# Patient Record
Sex: Female | Born: 1975 | Race: Black or African American | Hispanic: No | Smoking: Never smoker
Health system: Southern US, Community
[De-identification: ages and names within clinical notes are randomized; demographics above are authoritative.]

## PROBLEM LIST (undated history)

## (undated) DIAGNOSIS — R87619 Unspecified abnormal cytological findings in specimens from cervix uteri: Secondary | ICD-10-CM

## (undated) DIAGNOSIS — E669 Obesity, unspecified: Secondary | ICD-10-CM

## (undated) DIAGNOSIS — K589 Irritable bowel syndrome without diarrhea: Secondary | ICD-10-CM

## (undated) DIAGNOSIS — N809 Endometriosis, unspecified: Secondary | ICD-10-CM

## (undated) DIAGNOSIS — H6980 Other specified disorders of Eustachian tube, unspecified ear: Secondary | ICD-10-CM

## (undated) DIAGNOSIS — H6990 Unspecified Eustachian tube disorder, unspecified ear: Secondary | ICD-10-CM

## (undated) DIAGNOSIS — G629 Polyneuropathy, unspecified: Secondary | ICD-10-CM

## (undated) DIAGNOSIS — B029 Zoster without complications: Secondary | ICD-10-CM

## (undated) DIAGNOSIS — K219 Gastro-esophageal reflux disease without esophagitis: Secondary | ICD-10-CM

## (undated) DIAGNOSIS — T7840XA Allergy, unspecified, initial encounter: Secondary | ICD-10-CM

## (undated) DIAGNOSIS — Z87442 Personal history of urinary calculi: Secondary | ICD-10-CM

## (undated) DIAGNOSIS — K5900 Constipation, unspecified: Secondary | ICD-10-CM

## (undated) HISTORY — DX: Zoster without complications: B02.9

## (undated) HISTORY — PX: WISDOM TOOTH EXTRACTION: SHX21

## (undated) HISTORY — DX: Allergy, unspecified, initial encounter: T78.40XA

## (undated) HISTORY — DX: Constipation, unspecified: K59.00

## (undated) HISTORY — DX: Obesity, unspecified: E66.9

## (undated) HISTORY — DX: Other specified disorders of Eustachian tube, unspecified ear: H69.80

## (undated) HISTORY — DX: Unspecified eustachian tube disorder, unspecified ear: H69.90

## (undated) HISTORY — DX: Unspecified abnormal cytological findings in specimens from cervix uteri: R87.619

## (undated) HISTORY — DX: Polyneuropathy, unspecified: G62.9

## (undated) HISTORY — PX: BRAIN SURGERY: SHX531

## (undated) HISTORY — PX: TONSILLECTOMY: SHX5217

## (undated) HISTORY — PX: COLONOSCOPY: SHX174

## (undated) HISTORY — DX: Irritable bowel syndrome, unspecified: K58.9

---

## 1997-06-21 ENCOUNTER — Encounter: Admission: RE | Admit: 1997-06-21 | Discharge: 1997-09-19 | Payer: Self-pay | Admitting: Obstetrics & Gynecology

## 1998-02-14 ENCOUNTER — Other Ambulatory Visit: Admission: RE | Admit: 1998-02-14 | Discharge: 1998-02-14 | Payer: Self-pay | Admitting: Obstetrics and Gynecology

## 1998-04-29 ENCOUNTER — Other Ambulatory Visit: Admission: RE | Admit: 1998-04-29 | Discharge: 1998-04-29 | Payer: Self-pay | Admitting: Obstetrics and Gynecology

## 1999-09-02 ENCOUNTER — Emergency Department (HOSPITAL_COMMUNITY): Admission: EM | Admit: 1999-09-02 | Discharge: 1999-09-02 | Payer: Self-pay | Admitting: Emergency Medicine

## 1999-10-31 ENCOUNTER — Other Ambulatory Visit: Admission: RE | Admit: 1999-10-31 | Discharge: 1999-10-31 | Payer: Self-pay | Admitting: Obstetrics and Gynecology

## 2000-05-26 ENCOUNTER — Inpatient Hospital Stay (HOSPITAL_COMMUNITY): Admission: AD | Admit: 2000-05-26 | Discharge: 2000-05-26 | Payer: Self-pay | Admitting: Obstetrics & Gynecology

## 2000-10-08 ENCOUNTER — Emergency Department (HOSPITAL_COMMUNITY): Admission: EM | Admit: 2000-10-08 | Discharge: 2000-10-08 | Payer: Self-pay | Admitting: Emergency Medicine

## 2002-06-12 ENCOUNTER — Other Ambulatory Visit: Admission: RE | Admit: 2002-06-12 | Discharge: 2002-06-12 | Payer: Self-pay | Admitting: Obstetrics and Gynecology

## 2003-07-16 ENCOUNTER — Other Ambulatory Visit: Admission: RE | Admit: 2003-07-16 | Discharge: 2003-07-16 | Payer: Self-pay | Admitting: Obstetrics and Gynecology

## 2005-03-14 ENCOUNTER — Other Ambulatory Visit: Admission: RE | Admit: 2005-03-14 | Discharge: 2005-03-14 | Payer: Self-pay | Admitting: Obstetrics and Gynecology

## 2007-08-03 ENCOUNTER — Inpatient Hospital Stay (HOSPITAL_COMMUNITY): Admission: AD | Admit: 2007-08-03 | Discharge: 2007-08-03 | Payer: Self-pay | Admitting: Obstetrics and Gynecology

## 2007-11-13 ENCOUNTER — Ambulatory Visit: Payer: Self-pay | Admitting: Internal Medicine

## 2007-11-13 LAB — CONVERTED CEMR LAB
BUN: 10 mg/dL (ref 6–23)
Cholesterol: 168 mg/dL (ref 0–200)
GFR calc non Af Amer: 89 mL/min
Glucose, Bld: 95 mg/dL (ref 70–99)
Total CHOL/HDL Ratio: 3
Triglycerides: 71 mg/dL (ref 0–149)

## 2007-11-14 ENCOUNTER — Encounter: Payer: Self-pay | Admitting: Internal Medicine

## 2008-02-19 ENCOUNTER — Ambulatory Visit: Payer: Self-pay | Admitting: Internal Medicine

## 2008-04-23 ENCOUNTER — Ambulatory Visit: Payer: Self-pay | Admitting: Internal Medicine

## 2008-08-01 ENCOUNTER — Telehealth: Payer: Self-pay | Admitting: Family Medicine

## 2008-09-17 ENCOUNTER — Ambulatory Visit: Payer: Self-pay | Admitting: Internal Medicine

## 2008-09-17 DIAGNOSIS — B029 Zoster without complications: Secondary | ICD-10-CM | POA: Insufficient documentation

## 2008-09-17 DIAGNOSIS — J3089 Other allergic rhinitis: Secondary | ICD-10-CM | POA: Insufficient documentation

## 2008-09-17 LAB — CONVERTED CEMR LAB
Basophils Absolute: 0 K/uL (ref 0.0–0.1)
Basophils Relative: 0.8 % (ref 0.0–3.0)
Eosinophils Absolute: 0.1 K/uL (ref 0.0–0.7)
Eosinophils Relative: 2.5 % (ref 0.0–5.0)
HCT: 43.4 % (ref 36.0–46.0)
Hemoglobin: 15 g/dL (ref 12.0–15.0)
IgE (Immunoglobulin E), Serum: 266.1 [IU]/mL — ABNORMAL HIGH (ref 0.0–180.0)
Lymphocytes Relative: 48.9 % — ABNORMAL HIGH (ref 12.0–46.0)
Lymphs Abs: 1.8 K/uL (ref 0.7–4.0)
MCHC: 34.5 g/dL (ref 30.0–36.0)
MCV: 96.5 fL (ref 78.0–100.0)
Monocytes Absolute: 0.3 K/uL (ref 0.1–1.0)
Monocytes Relative: 8.1 % (ref 3.0–12.0)
Neutro Abs: 1.4 K/uL (ref 1.4–7.7)
Neutrophils Relative %: 39.7 % — ABNORMAL LOW (ref 43.0–77.0)
Platelets: 292 K/uL (ref 150.0–400.0)
RBC: 4.5 M/uL (ref 3.87–5.11)
RDW: 11.8 % (ref 11.5–14.6)
WBC: 3.6 10*3/microliter — ABNORMAL LOW (ref 4.5–10.5)
hCG, Beta Chain, Quant, S: 0.46 m[IU]/mL

## 2008-09-24 ENCOUNTER — Telehealth: Payer: Self-pay | Admitting: Internal Medicine

## 2008-09-24 ENCOUNTER — Encounter (INDEPENDENT_AMBULATORY_CARE_PROVIDER_SITE_OTHER): Payer: Self-pay | Admitting: *Deleted

## 2008-09-27 ENCOUNTER — Ambulatory Visit: Payer: Self-pay | Admitting: Internal Medicine

## 2008-09-27 DIAGNOSIS — L708 Other acne: Secondary | ICD-10-CM | POA: Insufficient documentation

## 2008-09-28 ENCOUNTER — Encounter (INDEPENDENT_AMBULATORY_CARE_PROVIDER_SITE_OTHER): Payer: Self-pay | Admitting: *Deleted

## 2008-10-04 ENCOUNTER — Telehealth: Payer: Self-pay | Admitting: Internal Medicine

## 2008-10-07 ENCOUNTER — Telehealth: Payer: Self-pay | Admitting: Internal Medicine

## 2008-10-12 ENCOUNTER — Ambulatory Visit: Payer: Self-pay | Admitting: Internal Medicine

## 2008-10-12 ENCOUNTER — Encounter: Payer: Self-pay | Admitting: Internal Medicine

## 2008-11-05 ENCOUNTER — Ambulatory Visit: Payer: Self-pay | Admitting: Internal Medicine

## 2008-11-29 ENCOUNTER — Telehealth: Payer: Self-pay | Admitting: Internal Medicine

## 2008-11-30 ENCOUNTER — Ambulatory Visit: Payer: Self-pay | Admitting: Internal Medicine

## 2008-12-02 ENCOUNTER — Telehealth: Payer: Self-pay | Admitting: Internal Medicine

## 2008-12-02 ENCOUNTER — Ambulatory Visit: Payer: Self-pay | Admitting: Internal Medicine

## 2008-12-02 ENCOUNTER — Encounter: Payer: Self-pay | Admitting: Internal Medicine

## 2008-12-02 ENCOUNTER — Other Ambulatory Visit: Admission: RE | Admit: 2008-12-02 | Discharge: 2008-12-02 | Payer: Self-pay | Admitting: Internal Medicine

## 2008-12-03 ENCOUNTER — Encounter (INDEPENDENT_AMBULATORY_CARE_PROVIDER_SITE_OTHER): Payer: Self-pay | Admitting: *Deleted

## 2008-12-06 ENCOUNTER — Telehealth: Payer: Self-pay | Admitting: Internal Medicine

## 2008-12-15 ENCOUNTER — Telehealth: Payer: Self-pay | Admitting: Internal Medicine

## 2009-03-08 ENCOUNTER — Ambulatory Visit: Payer: Self-pay | Admitting: Internal Medicine

## 2009-03-08 DIAGNOSIS — J039 Acute tonsillitis, unspecified: Secondary | ICD-10-CM

## 2009-03-29 ENCOUNTER — Encounter: Payer: Self-pay | Admitting: Internal Medicine

## 2009-06-07 ENCOUNTER — Ambulatory Visit: Payer: Self-pay | Admitting: Internal Medicine

## 2009-06-15 ENCOUNTER — Telehealth (INDEPENDENT_AMBULATORY_CARE_PROVIDER_SITE_OTHER): Payer: Self-pay | Admitting: *Deleted

## 2009-06-20 ENCOUNTER — Ambulatory Visit (HOSPITAL_BASED_OUTPATIENT_CLINIC_OR_DEPARTMENT_OTHER): Admission: RE | Admit: 2009-06-20 | Discharge: 2009-06-20 | Payer: Self-pay | Admitting: Otolaryngology

## 2009-12-05 ENCOUNTER — Telehealth (INDEPENDENT_AMBULATORY_CARE_PROVIDER_SITE_OTHER): Payer: Self-pay | Admitting: *Deleted

## 2009-12-05 ENCOUNTER — Ambulatory Visit (HOSPITAL_COMMUNITY): Admission: RE | Admit: 2009-12-05 | Discharge: 2009-12-05 | Payer: Self-pay | Admitting: Internal Medicine

## 2009-12-05 ENCOUNTER — Encounter: Payer: Self-pay | Admitting: Gastroenterology

## 2009-12-05 ENCOUNTER — Ambulatory Visit: Payer: Self-pay | Admitting: Internal Medicine

## 2009-12-05 DIAGNOSIS — K625 Hemorrhage of anus and rectum: Secondary | ICD-10-CM

## 2009-12-05 DIAGNOSIS — R1011 Right upper quadrant pain: Secondary | ICD-10-CM

## 2009-12-05 DIAGNOSIS — K5900 Constipation, unspecified: Secondary | ICD-10-CM | POA: Insufficient documentation

## 2009-12-05 LAB — CONVERTED CEMR LAB
ALT: 21 units/L (ref 0–35)
Albumin: 4 g/dL (ref 3.5–5.2)
Alkaline Phosphatase: 50 units/L (ref 39–117)
Basophils Relative: 1.1 % (ref 0.0–3.0)
Eosinophils Absolute: 0.1 10*3/uL (ref 0.0–0.7)
Eosinophils Relative: 1.7 % (ref 0.0–5.0)
HCT: 43 % (ref 36.0–46.0)
Hemoglobin: 14.7 g/dL (ref 12.0–15.0)
Lymphocytes Relative: 42.9 % (ref 12.0–46.0)
Lymphs Abs: 2.1 10*3/uL (ref 0.7–4.0)
MCHC: 34.1 g/dL (ref 30.0–36.0)
Monocytes Absolute: 0.5 10*3/uL (ref 0.1–1.0)
Neutro Abs: 2.2 10*3/uL (ref 1.4–7.7)
Neutrophils Relative %: 44.7 % (ref 43.0–77.0)
RBC: 4.37 M/uL (ref 3.87–5.11)
TSH: 1.54 microintl units/mL (ref 0.35–5.50)
Total Bilirubin: 0.3 mg/dL (ref 0.3–1.2)

## 2009-12-14 ENCOUNTER — Ambulatory Visit: Payer: Self-pay | Admitting: Internal Medicine

## 2009-12-14 LAB — HM COLONOSCOPY

## 2009-12-21 ENCOUNTER — Telehealth: Payer: Self-pay | Admitting: Internal Medicine

## 2010-04-11 ENCOUNTER — Ambulatory Visit: Payer: Self-pay | Admitting: Internal Medicine

## 2010-06-22 NOTE — Progress Notes (Signed)
Summary: Record request  Request for records received from [facility]. Amada Jupiter and Veterinary surgeon at [facility]. Request forwarded to [facility]. Dena Chavis  June 15, 2009 1:17 PM

## 2010-06-22 NOTE — Letter (Signed)
Summary: [facility] Instructions  [facility]  [address] [place], [place] [address]   Phone: [phone]  Fax: [phone]       ALEISHA PAONE    [birth year]    MRN: [id number]        Procedure Day /Date: 12-14-09     Arrival Time: 12:30 PM      Procedure Time: 1:30 PM     Location of Procedure:                    X      [facility] (4th Floor)  PREPARATION FOR COLONOSCOPY WITH MOVIPREP   Starting 5 days prior to your procedure 12-09-09 do not eat nuts, seeds, popcorn, corn, beans, peas,  salads, or any raw vegetables.  Do not take any fiber supplements (e.g. Metamucil, Citrucel, and Benefiber).  THE DAY BEFORE YOUR PROCEDURE         DATE:   12-13-09 KGM:WNUUVOZ  1.  Drink clear liquids the entire day-NO SOLID FOOD  2.  Do not drink anything colored red or purple.  Avoid juices with pulp.  No orange juice.  3.  Drink at least 64 oz. (8 glasses) of fluid/clear liquids during the day to prevent dehydration and help the prep work efficiently.  CLEAR LIQUIDS INCLUDE: Water Jello Ice Popsicles Tea (sugar ok, no milk/cream) Powdered fruit flavored drinks Coffee (sugar ok, no milk/cream) Gatorade Juice: apple, white grape, white cranberry  Lemonade Clear bullion, consomm, broth Carbonated beverages (any kind) Strained chicken noodle soup Hard Candy                             4.  In the morning, mix first dose of MoviPrep solution:    Empty 1 Pouch A and 1 Pouch B into the disposable container    Add lukewarm drinking water to the top line of the container. Mix to dissolve    Refrigerate (mixed solution should be used within 24 hrs)  5.  Begin drinking the prep at 5:00 p.m. The MoviPrep container is divided by 4 marks.   Every 15 minutes drink the solution down to the next mark (approximately 8 oz) until the full liter is complete.   6.  Follow completed prep with 16 oz of clear liquid of your choice (Nothing red or purple).  Continue to  drink clear liquids until bedtime.  7.  Before going to bed, mix second dose of MoviPrep solution:    Empty 1 Pouch A and 1 Pouch B into the disposable container    Add lukewarm drinking water to the top line of the container. Mix to dissolve    Refrigerate  THE DAY OF YOUR PROCEDURE      DATE: 12-14-09 DAY: Wednesday  Beginning at 8:30 AM  (5 hours before procedure):         1. Every 15 minutes, drink the solution down to the next mark (approx 8 oz) until the full liter is complete.  2. Follow completed prep with 16 oz. of clear liquid of your choice.    3. You may drink clear liquids until 11:30 PM  (2 HOURS BEFORE PROCEDURE).   MEDICATION INSTRUCTIONS  Unless otherwise instructed, you should take regular prescription medications with a small sip of water   as early as possible the morning of your procedure.        OTHER INSTRUCTIONS  You will need a responsible  adult at least 35 years of age to accompany you and drive you home.   This person must remain in the waiting room during your procedure.  Wear loose fitting clothing that is easily removed.  Leave jewelry and other valuables at home.  However, you may wish to bring a book to read or  an iPod/MP3 player to listen to music as you wait for your procedure to start.  Remove all body piercing jewelry and leave at home.  Total time from sign-in until discharge is approximately 2-3 hours.  You should go home directly after your procedure and rest.  You can resume normal activities the  day after your procedure.  The day of your procedure you should not:   Drive   Make legal decisions   Operate machinery   Drink alcohol   Return to work  You will receive specific instructions about eating, activities and medications before you leave.    The above instructions have been reviewed and explained to me by   _______________________    I fully understand and can verbalize these instructions  _____________________________ Date _________

## 2010-06-22 NOTE — Progress Notes (Signed)
Summary: No BM since 7-27  Phone Note Call from Patient Call back at Home Phone [phone]   Call For: Dr Leone Payor Reason for Call: Talk to Nurse Summary of Call: Worried she has not had a bm since colon on 7-27. Initial call taken by: Leanor Kail [facility],  December 21, 2009 9:37 AM  Follow-up for Phone Call        I reviewed with her the instuctions from Dr Leone Payor given to her at  the colonoscopy on 12/14/09, patient will try dulcolax as instructed.  I have asked her to call me back if she has any further problems or concerns. Follow-up by: Darcey Nora RN, CGRN,  December 21, 2009 9:51 AM
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
## 2010-06-22 NOTE — Assessment & Plan Note (Signed)
Summary: ABD. PAIN, CONSTIPATION, RECTAL BLEEDING   (NEW TO GI)   DEBORAH   History of Present Illness Visit Type: Initial Consult Primary GI MD: Stan Head MD [facility] Primary Provider: Etta Grandchild MD Requesting Provider: n/a Chief Complaint: abdominal pain, constipation History of Present Illness:   35 Y.O FEMALE NEW TO G.I TODAY. SHE COMES IN WITH C/O LONG TERM CONSTIPATION DATING BACK TO CHILDHOOD. STATES SHE USUALLY HAS A BM Q 2-3 DAYS. SHE HAS HAD A CHANGE IN HER BOWEL HABITS OVER THE PAST  COUPLE MONTHS WITH HARD PELLET LIKE STOOLS,INCREASED STRAINING, AND INTERMITTENT RECTAL BLEEDING .SHE NOTES BLOOD MIXED WITH STOOL AND ON THE TISSUE. SHE HAS ALSO BEEN HAVING SOME RUQ DISCOMFORT RADIATING INTO HER BACK. THIS IS MORE PROMINENT IN THE MORNINGS- NO N/V. SHE WENT TO URGENT CARE OVER THE WEEKEND,WAS TOLD TO TAKE SEVERAL DOSES OF MIRALAX WHICH SHE DID YESTERDAY-STILL HAS NOT HAD MUCH OF A BM,FEELS BLOATED.SHE DENIES ANY HX OF HEMORRHOIDS, NO RECTAL PAIN.   GI Review of Systems    Reports abdominal pain, bloating, nausea, and  vomiting.      Denies acid reflux, belching, chest pain, dysphagia with liquids, dysphagia with solids, heartburn, loss of appetite, vomiting blood, weight loss, and  weight gain.      Reports black tarry stools, change in bowel habits, constipation, and  rectal bleeding.     Denies anal fissure, diarrhea, diverticulosis, fecal incontinence, heme positive stool, hemorrhoids, irritable bowel syndrome, jaundice, light color stool, liver problems, and  rectal pain.    Current Medications (verified): 1)  Allegra 180 Mg Tabs (Fexofenadine Hcl) .... As Needed 2)  Doxycycline Hyclate 100 Mg Caps (Doxycycline Hyclate) .Marland Kitchen.. 1 By Mouth Two Times A Day  Allergies (verified): 1)  ! Pcn 2)  ! Amoxicillin  Past History:  Past Medical History: UCD- fully immunized : ALLERGIC RHINITIS DUE TO OTHER ALLERGEN (ICD-477.8) HERPES ZOSTER (ICD-053.9) URI (ICD-465.9) EUSTACHIAN  TUBE DYSFUNCTION, RIGHT (ICD-381.81) CERUMEN IMPACTION, LEFT (ICD-380.4)   Physician roster:                 Gyn - Dr. Marcelle Overlie  Past Surgical History: Reviewed history from 09/17/2008 and no changes required. wisdom teeth extracted. 1 c-section G1P1  Family History: Reviewed history from 11/05/2008 and no changes required. father- 29: good health mother - 6: lipids Neg- breast cancer, DM, CAD Pos - PAunt colon cancer   Mother-living age 64; Hypertension. Chron's Disease, Kidney stones Father-living age 69; Kidney Stones Sibling 1- living age 29; asthma, allergies Sibling 2- living age 36; allergies Sibling 3- living age 72; allergies  Clotting Disorder-Uncle Cancer-Grandmothers-cervical              Aunt-Colon Arthritis-Grandmother Diabetes-Aunt  Social History: HSG, some college married '02; 1 child. 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  Alcohol Use - no  Review of Systems       The patient complains of allergy/sinus, back pain, night sweats, shortness of breath, sore throat, and swelling of feet/legs.         ROS OTHERWISE SEE HPI  Vital Signs:  Patient profile:   35 year old female Height:      64 inches Weight:      176 pounds BMI:     30.32 BSA:     1.85 Pulse rate:   80 / minute Pulse rhythm:   regular BP sitting:   90 /  60  (left arm)  Vitals Entered By: Merri Ray CMA Duncan Dull) (December 05, 2009 10:17 AM)  Physical Exam  General:  Well developed, well nourished, no acute distress. Head:  Normocephalic and atraumatic. Eyes:  PERRLA, no icterus. Lungs:  Clear throughout to auscultation. Heart:  Regular rate and rhythm; no murmurs, rubs,  or bruits. Abdomen:  SOFT, NONTENDER, NO MASS OR HSM,BS+ Rectal:  NO EXTERNAL HEMORRHOIDS,DECREASED RECTAL TONE,SCANT STOOL TRACE POSITIVE, NO LESION FELT Neurologic:  Alert and   oriented x4;  grossly normal neurologically. Psych:  Alert and cooperative. Normal mood and affect.   Impression & Recommendations:  Problem # 1:  CONSTIPATION (ICD-564.00) Assessment Deteriorated 35 Y.O FEMALE WITH CHRONIC CONSTIPATION WITH RECENT WORSENING, ASSOCIATED INTERMITTENT RECTAL BLEEDING, AND FAMILY HX OF COLON CANCER IN A PATERNAL AUNT  DX IN HER 30'S . SUSPECT FUNCTIONALCONSTIPATION WITH LOCAL ANORECTAL IRRRITATION -HOWEVER CANNOT R/O OCCULT COLON LESION  LABS AS BELOW SCHEDULE FOR COLONOSCOPYWITH DR GESSNER. PROCEDURE/ RISKS DISCUSSED IN DETAIL WITH PT WHO IS AGREEABLE TO PROCEED HAVE ASKED HER TO TAKE AN ADDITIONAL 4-5 DOSES OF MIRALAX TODAY OR IN AM TO PURGE BOWELS,THEN START  MIRALAX 17 GM DAILY IN AM.. Orders: Colonoscopy (Colon) Ultrasound Abdomen (UAS) TLB-Hepatic/Liver Function Pnl (80076-HEPATIC) TLB-CBC Platelet - w/Differential (85025-CBCD) TLB-TSH (Thyroid Stimulating Hormone) (84443-TSH)  Problem # 2:  RUQ PAIN (ICD-789.01) Assessment: New ETIOLOGY NOT CLEAR, MAY BE RELATED TO OBSTIPATION, WILL R/O GB DISEASE.  SCHEDULE FOR UPPER ABDOMINAL US. Orders: Colonoscopy (Colon) Ultrasound Abdomen (UAS) TLB-Hepatic/Liver Function Pnl (80076-HEPATIC) TLB-CBC Platelet - w/Differential (85025-CBCD) TLB-TSH (Thyroid Stimulating Hormone) (84443-TSH)  Problem # 3:  FAMILY HX COLON CANCER (ICD-V16.0) Assessment: Comment Only PATERNAL AUNT-DX  UNDER AGE 31.  Patient Instructions: 1)  Please go to lab, basement level. 2)  We scheduled the Ultrasound for today at [facility]. Arrive at 12:45PM. 3)  DO NOT EAT OR DRINK ANYTHING UNTIL AFTER THE TEST. 4)  We scheduled the Colonoscopy with Dr. Leone Payor for 12-14-09. 5)  Directions and brochure provided. 6)  [facility] Patient Information Guide given to patient. 7)  Today or tomorrow evening take 5 doses of Miralax to purge your bowels.  Drink 1 glass of the 17 grams of Mirlax in gatorade or  water , wait 15 min between glasses.  8)  Then use Miralax 17 grams in water or clear juice daily to help with constipation. 9)  Copy sent to : Etta Grandchild, MD 10)  The medication list was reviewed and reconciled.  All changed / newly prescribed medications were explained.  A complete medication list was provided to the patient / caregiver. Prescriptions: MOVIPREP 100 GM  SOLR (PEG-KCL-NACL-NASULF-NA ASC-C) As per prep instructions.  #1 x 0   Entered by:   Lowry Ram NCMA   Authorized by:   Sammuel Cooper PA-c   Signed by:   Lowry Ram NCMA on 12/05/2009   Method used:   Electronically to        [facility]  [id number] ([facility]       [address]       [place], [place]  [address]       Ph: [phone] or [phone]       Fax: [phone]   RxID:   [id number]

## 2010-06-22 NOTE — Procedures (Signed)
Summary: Colonoscopy  Patient: Manaia Samad Note: All result statuses are Final unless otherwise noted.  Tests: (1) Colonoscopy (COL)   COL Colonoscopy           DONE (C)     [facility]     [address]     [place], [place]  [address]           COLONOSCOPY PROCEDURE REPORT           PATIENT:  Prisila, Dlouhy  MR#:  [id number]     BIRTHDATE:  November 18, 1975, 33 yrs. old  GENDER:  female     ENDOSCOPIST:  Iva Boop, MD, [facility]     REF. BY:  Etta Grandchild, M.D.     PROCEDURE DATE:  12/14/2009     PROCEDURE:  Colonoscopy 29562     ASA CLASS:  Class I     INDICATIONS:  constipation, rectal bleeding     MEDICATIONS:   Fentanyl 75 mcg IV, Versed 5 mg IV CORRECTION 7 MG     IV           DESCRIPTION OF PROCEDURE:   After the risks benefits and     alternatives of the procedure were thoroughly explained, informed     consent was obtained.  Digital rectal exam was performed and     revealed no abnormalities.   The LB CF-[id number] endoscope     was introduced through the anus and advanced to the cecum, which     was identified by both the appendix and ileocecal valve, without     limitations.  The quality of the prep was excellent, using     MoviPrep.  The instrument was then slowly withdrawn as the colon     was fully examined.     Insertion: 4:23 minutes Withdrawal: 8:00 minutes     <<PROCEDUREIMAGES>>           FINDINGS:  A normal appearing cecum, ileocecal valve, and     appendiceal orifice were identified. The ascending, hepatic     flexure, transverse, splenic flexure, descending, sigmoid colon,     and rectum appeared unremarkable.   Retroflexed views in the     rectum revealed no abnormalities.    The scope was then withdrawn     from the patient and the procedure completed.           COMPLICATIONS:  None     ENDOSCOPIC IMPRESSION:     1) Normal colonoscopy, excellent prep     RECOMMENDATIONS:     1) Take MiraLax (1 tablespoon, capful or packet) mixed in  8     ounces of liquid 2 times a day every day to help bowels move. If     no bowel movement after 2-3 days take a dulcolax tablet (or 2) to     promote a bowel movement.     If you get diarrhea take a day or two off from MiraLax and then     restart it at 1 time a day.           2) Call Dr. Marvell Fuller office and make an appointment to be seen     in early September.           Iva Boop, MD, [facility]           CC:  Etta Grandchild, MD     The Patient  n.     REVISED:  01/13/2010 11:39 AM     eSIGNED:   Iva Boop at 01/13/2010 11:39 AM           Lillia Pauls, [id number]  Note: An exclamation mark (!) indicates a result that was not dispersed into the flowsheet. Document Creation Date: 01/13/2010 11:41 AM _______________________________________________________________________  (1) Order result status: Final Collection or observation date-time: 12/14/2009 14:02 Requested date-time:  Receipt date-time:  Reported date-time:  Referring Physician:   Ordering Physician: Stan Head [phone]) Specimen Source:  Source: Launa Grill Order Number: [id number] Lab site:

## 2010-06-22 NOTE — Progress Notes (Signed)
Summary: Abd. Pain/Rectal Bleeding  Phone Note Call from Patient Call back at Home Phone [phone]   [name]: Patient Call For: DOD Reason for Call: Talk to Nurse Summary of Call: Patient was seen in Urgent Care yesterday and was told the she needed to f.u with a GI doctor this week, states that she has severe abd pain the radiates to her back, rectal bleeding and constipation x 2 weeks. Initial call taken by: Tawni Levy,  December 05, 2009 8:26 AM  Follow-up for Phone Call        Pt. will see Mike Gip [facility] today at 10:30am.    Follow-up by: Laureen Ochs LPN,  December 05, 2009 8:41 AM

## 2010-06-22 NOTE — Assessment & Plan Note (Signed)
Summary: BUMP IN THROAT/BUMPS AROUND MOUTH-LB   Vital Signs:  Patient profile:   35 year old female Height:      64 inches Weight:      179 pounds BMI:     30.84 O2 Sat:      97 % on Room air Temp:     98.5 degrees F oral Pulse rate:   77 / minute BP sitting:   108 / 72  (left arm) Cuff size:   large  Vitals Entered By: Bill Salinas CMA (April 11, 2010 4:59 PM)  O2 Flow:  Room air  Primary Care Provider:  Etta Grandchild MD   History of Present Illness: Patient presents with concern for a sore throat and a soft tissue mass she feels and sees in the right throat. She has had no fever, no rigors, no dysphagia or odynophagia, no weight loss, no night sweats.  Current Medications (verified): 1)  Allegra 180 Mg Tabs (Fexofenadine Hcl) .... As Needed 2)  Doxycycline Hyclate 100 Mg Caps (Doxycycline Hyclate) .Marland Kitchen.. 1 By Mouth Two Times A Day  Allergies (verified): 1)  ! Pcn 2)  ! 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 Marcelle Overlie  Past Surgical History: Last updated: 09/17/2008 wisdom teeth extracted. 1 c-section G1P1  Family History: Last updated: 11/05/2008 father- 1957: good health mother - 1957: lipids Neg- breast cancer, DM, CAD Pos - PAunt colon cancer   Mother-living age 64; Hypertension. Chron's Disease, Kidney stones Father-living age 43; Kidney Stones Sibling 1- living age 4; asthma, allergies Sibling 2- living age 5; allergies Sibling 3- living age 41; allergies  Clotting Disorder-Uncle Cancer-Grandmothers-cervical              Aunt-Colon Arthritis-Grandmother Diabetes-Aunt  Social History: Last updated: 12/05/2009 HSG, some college married '02; 1 child. 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  Alcohol Use - no  Review of Systems  The patient denies anorexia, fever, weight loss, weight gain, hoarseness, chest pain, dyspnea on exertion, headaches, abdominal pain, suspicious skin lesions, abnormal bleeding, and enlarged lymph nodes.    Physical Exam  General:  WNWD AA woman in no distress Head:  Normocephalic and atraumatic without obvious abnormalities. No apparent alopecia or balding. Mouth:  posterior pharynx without exudate or swelling. At the right tonsilar fossa there is mild erythema, no mass or soft tissue abnormality. Neck:  supple and no masses.   Lungs:  normal respiratory effort.   Heart:  normal rate and regular rhythm.   Skin:  turgor normal and color normal.   Cervical Nodes:  no cervical or submandibular adenoapthy   Impression & Recommendations:  Problem # 1:  SORE THROAT (ICD-462) No evidence of a bacterial infection or tonsillitis or mass effect. Probable viral pharyngitis  Plan - supportive care. She is advised that for persistent pain, fever or dysphagia she will need reevaluation.   The following medications were removed from the medication list:    Doxycycline Hyclate 100 Mg Caps (Doxycycline hyclate) .Marland Kitchen... 1 by mouth two times a day   Orders Added: 1)  Est. Patient Level II [13244]

## 2010-08-06 LAB — POCT HEMOGLOBIN-HEMACUE: Hemoglobin: 14.3 g/dL (ref 12.0–15.0)

## 2010-08-23 ENCOUNTER — Telehealth: Payer: Self-pay | Admitting: *Deleted

## 2010-08-23 ENCOUNTER — Other Ambulatory Visit (INDEPENDENT_AMBULATORY_CARE_PROVIDER_SITE_OTHER): Payer: 59

## 2010-08-23 DIAGNOSIS — Z0000 Encounter for general adult medical examination without abnormal findings: Secondary | ICD-10-CM

## 2010-08-23 LAB — BASIC METABOLIC PANEL WITH GFR
BUN: 10 mg/dL (ref 6–23)
CO2: 28 meq/L (ref 19–32)
Calcium: 8.8 mg/dL (ref 8.4–10.5)
Chloride: 103 meq/L (ref 96–112)
Creatinine, Ser: 0.8 mg/dL (ref 0.4–1.2)
GFR: 113.34 mL/min
Glucose, Bld: 73 mg/dL (ref 70–99)
Potassium: 4.1 meq/L (ref 3.5–5.1)
Sodium: 140 meq/L (ref 135–145)

## 2010-08-23 LAB — LIPID PANEL
Cholesterol: 168 mg/dL (ref 0–200)
LDL Cholesterol: 98 mg/dL (ref 0–99)
Total CHOL/HDL Ratio: 3
Triglycerides: 51 mg/dL (ref 0.0–149.0)
VLDL: 10.2 mg/dL (ref 0.0–40.0)

## 2010-08-23 LAB — URINALYSIS, ROUTINE W REFLEX MICROSCOPIC
Bilirubin Urine: NEGATIVE
Hgb urine dipstick: NEGATIVE
Ketones, ur: NEGATIVE
Nitrite: NEGATIVE
Specific Gravity, Urine: 1.02
Total Protein, Urine: NEGATIVE
Urine Glucose: NEGATIVE
Urobilinogen, UA: 0.2
pH: 6 (ref 5.0–8.0)

## 2010-08-23 LAB — CBC WITH DIFFERENTIAL/PLATELET
Basophils Relative: 0.7 % (ref 0.0–3.0)
Eosinophils Absolute: 0 10*3/uL (ref 0.0–0.7)
Monocytes Absolute: 0.3 10*3/uL (ref 0.1–1.0)
RBC: 3.98 Mil/uL (ref 3.87–5.11)
WBC: 4.3 10*3/uL — ABNORMAL LOW (ref 4.5–10.5)

## 2010-08-23 LAB — HEPATIC FUNCTION PANEL
ALT: 11 U/L (ref 0–35)
AST: 14 U/L (ref 0–37)
Albumin: 3.9 g/dL (ref 3.5–5.2)
Alkaline Phosphatase: 45 U/L (ref 39–117)
Bilirubin, Direct: 0.1 mg/dL (ref 0.0–0.3)
Total Bilirubin: 0.7 mg/dL (ref 0.3–1.2)
Total Protein: 7 g/dL (ref 6.0–8.3)

## 2010-08-23 LAB — TSH: TSH: 0.92 u[IU]/mL (ref 0.35–5.50)

## 2010-08-23 NOTE — Telephone Encounter (Signed)
Patient requesting to have HIV added to cpx labs she had today. Apt is next week. If ok, please also provide dx code.

## 2010-08-23 NOTE — Telephone Encounter (Signed)
Ok to add HIV screen v01.6

## 2010-08-24 ENCOUNTER — Other Ambulatory Visit: Payer: Self-pay | Admitting: Internal Medicine

## 2010-08-24 ENCOUNTER — Other Ambulatory Visit: Payer: 59

## 2010-08-24 DIAGNOSIS — Z202 Contact with and (suspected) exposure to infections with a predominantly sexual mode of transmission: Secondary | ICD-10-CM

## 2010-08-24 NOTE — Telephone Encounter (Signed)
Pt aware, add on sheet faxed - lab aware

## 2010-08-25 LAB — HIV ANTIBODY (ROUTINE TESTING W REFLEX): HIV: NONREACTIVE

## 2010-08-28 ENCOUNTER — Encounter: Payer: Self-pay | Admitting: Internal Medicine

## 2010-08-29 ENCOUNTER — Encounter: Payer: Self-pay | Admitting: Internal Medicine

## 2010-08-29 ENCOUNTER — Ambulatory Visit (INDEPENDENT_AMBULATORY_CARE_PROVIDER_SITE_OTHER): Payer: 59 | Admitting: Internal Medicine

## 2010-08-29 DIAGNOSIS — L708 Other acne: Secondary | ICD-10-CM

## 2010-08-29 DIAGNOSIS — L700 Acne vulgaris: Secondary | ICD-10-CM

## 2010-08-29 DIAGNOSIS — F341 Dysthymic disorder: Secondary | ICD-10-CM

## 2010-08-29 DIAGNOSIS — Z0000 Encounter for general adult medical examination without abnormal findings: Secondary | ICD-10-CM

## 2010-08-29 DIAGNOSIS — F418 Other specified anxiety disorders: Secondary | ICD-10-CM

## 2010-08-29 MED ORDER — DROSPIRENONE-ETHINYL ESTRADIOL 3-0.02 MG PO TABS: 1.0000 | ORAL_TABLET | Freq: Every day | ORAL | Status: DC

## 2010-08-29 MED ORDER — BUPROPION HCL ER (XL) 150 MG PO TB24: 150.0000 mg | ORAL_TABLET | Freq: Every day | ORAL | Status: DC
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
## 2010-08-30 ENCOUNTER — Encounter: Payer: Self-pay | Admitting: Internal Medicine

## 2010-09-08 ENCOUNTER — Telehealth: Payer: Self-pay | Admitting: Internal Medicine
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
## 2010-09-08 NOTE — Telephone Encounter (Signed)
Usually a broken rib will heal. There is no intevention. Would like a copy of x-ray report from urgent care. She will need f/u OV if her symptms don't improve.

## 2010-09-11 NOTE — Telephone Encounter (Signed)
LMOM to inform Pt. 

## 2010-09-18 ENCOUNTER — Telehealth: Payer: Self-pay | Admitting: *Deleted

## 2010-09-18 NOTE — Telephone Encounter (Signed)
Pt feels that Wellbutrin has been giving her hives. She want alt from MD. Please advise.

## 2010-09-19 NOTE — Telephone Encounter (Signed)
Patient notified and will update [place] on status

## 2010-09-19 NOTE — Telephone Encounter (Signed)
Don't usually see hives with wellbutrin. OK to stop medication. See if hives clear. A cahnge in meds after hives clear

## 2010-09-20 ENCOUNTER — Other Ambulatory Visit: Payer: Self-pay | Admitting: Obstetrics and Gynecology

## 2010-09-25 ENCOUNTER — Encounter: Payer: Self-pay | Admitting: Internal Medicine

## 2010-09-26 ENCOUNTER — Ambulatory Visit (INDEPENDENT_AMBULATORY_CARE_PROVIDER_SITE_OTHER): Payer: 59 | Admitting: Internal Medicine

## 2010-09-26 DIAGNOSIS — T50905A Adverse effect of unspecified drugs, medicaments and biological substances, initial encounter: Secondary | ICD-10-CM

## 2010-09-26 DIAGNOSIS — L500 Allergic urticaria: Secondary | ICD-10-CM

## 2010-09-26 DIAGNOSIS — F329 Major depressive disorder, single episode, unspecified: Secondary | ICD-10-CM

## 2010-09-26 DIAGNOSIS — F419 Anxiety disorder, unspecified: Secondary | ICD-10-CM | POA: Insufficient documentation

## 2010-09-26 DIAGNOSIS — F341 Dysthymic disorder: Secondary | ICD-10-CM

## 2010-09-26 MED ORDER — PREDNISONE 10 MG PO TABS: 10.0000 mg | ORAL_TABLET | Freq: Every day | ORAL | Status: AC

## 2010-09-26 MED ORDER — DESVENLAFAXINE SUCCINATE ER 50 MG PO TB24: 50.0000 mg | ORAL_TABLET | Freq: Every day | ORAL | Status: DC

## 2010-09-26 NOTE — Progress Notes (Signed)
  Subjective:    Patient ID: Susan Roth, female    DOB: June 01, 1975, 35 y.o.   MRN: [id number]  HPI Susan Roth returns for follow-up. At her last visit she was started on wellbutrin for anxiety and depression. She was also able to see a counselor, covered by her work related insurance. She reports that she did well: she was more energetic and felt better. After 3 weeks of taking wellbutrin she developed diffuse urticaria. Belatedly she remembers being on wellbutrin several years ago and had this same reaction. She has had no respiratory problems. She is taking claratin in the AM and benadryl at night for relief of itching.   PMH, FamHx and SocHx reviewed for any changes and relevance.    Review of Systems  Constitutional: Negative.   HENT: Negative.   Eyes: Negative.   Respiratory: Negative.   Cardiovascular: Negative.   Gastrointestinal: Negative.   Musculoskeletal: Negative.   Skin: Positive for color change.       [Diffuse hives. Neurological: Negative.   Psychiatric/Behavioral: Negative for hallucinations and confusion. The patient is nervous/anxious. The patient is not hyperactive.        Objective:   Physical Exam WNWD, overweight AA woman in no distress Lungs - good breath sounds, no wheezing Derm - erythematous macular-papular rash       Assessment & Plan:  1. Urticaria - most likely a drug reaction to wellbutrin.  Plan - continue claretin and benadryl           Prednisone 10mg  qd x 10  2. Depression/anxiety - did well with wellbutrin but was intolerant    Plan - trial of pristiq 50mg  daily - sample provided and discount card.

## 2010-10-18 ENCOUNTER — Encounter: Payer: Self-pay | Admitting: Internal Medicine

## 2010-10-19 ENCOUNTER — Ambulatory Visit: Payer: 59 | Admitting: Internal Medicine

## 2010-10-26 ENCOUNTER — Encounter: Payer: Self-pay | Admitting: Internal Medicine

## 2010-10-27 ENCOUNTER — Ambulatory Visit (INDEPENDENT_AMBULATORY_CARE_PROVIDER_SITE_OTHER): Payer: 59 | Admitting: Internal Medicine

## 2010-10-27 DIAGNOSIS — F341 Dysthymic disorder: Secondary | ICD-10-CM

## 2010-10-27 DIAGNOSIS — Z0289 Encounter for other administrative examinations: Secondary | ICD-10-CM

## 2010-10-29 NOTE — Progress Notes (Signed)
  Subjective:    Patient ID: Susan Roth, female    DOB: 1975-09-29, 35 y.o.   MRN: [id number]  HPI  No show - no charge   Review of Systems     Objective:   Physical Exam        Assessment & Plan:

## 2010-12-15 ENCOUNTER — Other Ambulatory Visit: Payer: 59

## 2010-12-15 ENCOUNTER — Ambulatory Visit (INDEPENDENT_AMBULATORY_CARE_PROVIDER_SITE_OTHER): Payer: 59 | Admitting: Internal Medicine

## 2010-12-15 ENCOUNTER — Encounter: Payer: Self-pay | Admitting: *Deleted

## 2010-12-15 DIAGNOSIS — L509 Urticaria, unspecified: Secondary | ICD-10-CM

## 2010-12-15 DIAGNOSIS — L501 Idiopathic urticaria: Secondary | ICD-10-CM | POA: Insufficient documentation

## 2010-12-15 DIAGNOSIS — J3089 Other allergic rhinitis: Secondary | ICD-10-CM

## 2010-12-15 NOTE — Patient Instructions (Addendum)
Try combination antihistamine strategy for hives: zyrtec or claritin every day, PLUS  Tagamet or pepcid every day.  These are otc meds.  Consider trying Sudafed from pharmacy counter as a decongestant for stuffy head fullness.   Consider being off Yaz for a couple of months if possible, to see if it is contributing to your hives.   Order- lab- Allergy profile- dx allergies  Schedule return off all antihistamines- this includes the ones we mentioned above, and also otc cold/ allergy meds, cough syrups, otc sleep meds. If you aren't sure,  Please call us.

## 2010-12-15 NOTE — Progress Notes (Signed)
Subjective:    Patient ID: Susan Roth, female    DOB: 1975-12-07, 35 y.o.   MRN: [id number]  HPI 12/15/10- 30 yoF never smoker referred by Dr Debby Bud for allergy evaluation. Describes onset urticaria 3 months ago, shortly after starting Wellbutrin. Similar hives when she took it before, several years ago. Now off 3 months, but still experiencing at least one lesion daily. She also started Yaz hormonal med for acne control about the same time.  Also c/o ears pop, glands/ throat feel full. Motrin helps this feeling. Morning sinus pressure, teeth hurt, throat scratchy, - worst in mid-summer. Zyrtec some help. Had tonsils out Feb, 2011 trying to address this feeling. Denies chest discomfort, cough or wheeze. Eczema as a child. Family hx allergies- father hayfever, daughter asthma. Bananas make throat itch. No other food and no insect sting reaction.  CT sinuses 10/12/08- clear House on slab, no mold, central AC, 2 dogs in, no smokers.   Review of Systems Constitutional:   No-   weight loss, night sweats, fevers, chills, fatigue, lassitude. HEENT:   No-   headaches, difficulty swallowing, tooth/dental problems,                  CV:  No-   chest pain, orthopnea, PND, swelling in lower extremities, anasarca, dizziness, palpitations  GI:  No-   heartburn, indigestion, abdominal pain, nausea, vomiting, diarrhea,                 change in bowel habits, loss of appetite  Resp: No-   shortness of breath with exertion or at rest.  No-  excess mucus,             No-   productive cough,  No non-productive cough,  No-  coughing up of blood.              No-   change in color of mucus.  No- wheezing.    Skin  GU: No-   dysuria, change in color of urine, no urgency or frequency.  No- flank pain.  MS:  No-   joint pain or swelling.  No- decreased range of motion.  No- back pain.  Psych:  No- change in mood or affect. No depression or anxiety.  No memory loss.      Objective:   Physical  Exam General- Alert, Oriented, Affect-appropriate, Distress- none acute Skin- rash-none, cystic acne face, excoriation- none Lymphadenopathy- none Head- atraumatic            Eyes- Gross vision intact, PERRLA, conjunctivae clear secretions            Ears- Hearing, canals normal            Nose- Mucus bridging,No- Septal dev,, polyps, erosion, perforation             Throat- Mallampati II , mucosa clear , drainage- none, tonsils- atrophic Neck- flexible , trachea midline, no stridor , thyroid nl, carotid no bruit Chest - symmetrical excursion , unlabored           Heart/CV- RRR , no murmur , no gallop  , no rub, nl s1 s2                           - JVD- none , edema- none, stasis changes- none, varices- none           Lung- clear to P&A, wheeze- none, cough- none , dullness-none, rub- none  Chest wall-  Abd- tender-no, distended-no, bowel sounds-present, HSM- no Br/ Gen/ Rectal- Not done, not indicated Extrem- cyanosis- none, clubbing, none, atrophy- none, strength- nl Neuro- grossly intact to observation         Assessment & Plan:   No problem-specific assessment & plan notes found for this encounter.

## 2010-12-15 NOTE — Assessment & Plan Note (Addendum)
Onset began around time she was taking wellbutin, so that got blamed. However, she also began YAZ around that time, and it may be a hormonal problem. She takes Yaz for acne control and is not eager to be off it. She may need to come back to this issue if she can't control well enough with antihistamines. We can try dual therapy antihistamine strategy.

## 2010-12-15 NOTE — Assessment & Plan Note (Signed)
Complaint of nasal/ maxillary pressure, associated with annual summer into fall time of year. There may be an atopic component, or it may be non-allergic rhinitis, as discussed. What bothers her most is the sense of her ears popping and that her cervical glands/ throat feel full. We will try decongestant, nasal steroid, send allergy profile and return off antihistamines for allergy skin testing.

## 2010-12-17 ENCOUNTER — Encounter: Payer: Self-pay | Admitting: Internal Medicine

## 2010-12-19 LAB — ALLERGY FULL PROFILE
Allergen,Goose feathers, e70: <0.1 kU/L (ref <0.35)
Bahia Grass: 1.17 kU/L — ABNORMAL HIGH (ref <0.35)
Bermuda Grass: 0.94 kU/L — ABNORMAL HIGH (ref <0.35)
Candida Albicans: 1.41 kU/L — ABNORMAL HIGH (ref <0.35)
Curvularia lunata: 0.63 kU/L — ABNORMAL HIGH (ref <0.35)
D. farinae: 0.24 kU/L (ref <0.35)
Elm IgE: 0.48 kU/L — ABNORMAL HIGH (ref <0.35)
Fescue: 1.39 kU/L — ABNORMAL HIGH (ref <0.35)
G005 Rye, Perennial: 1.02 kU/L — ABNORMAL HIGH (ref <0.35)
G009 Red Top: 1.9 kU/L — ABNORMAL HIGH (ref <0.35)
Goldenrod: <0.1 kU/L (ref <0.35)
Helminthosporium halodes: 1.27 kU/L — ABNORMAL HIGH (ref <0.35)
House Dust Hollister: 0.23 kU/L (ref <0.35)
Plantain: 0.34 kU/L (ref <0.35)

## 2010-12-25 NOTE — Progress Notes (Signed)
Quick Note:  PT aware of results. ______ 

## 2011-02-07 ENCOUNTER — Ambulatory Visit (INDEPENDENT_AMBULATORY_CARE_PROVIDER_SITE_OTHER): Payer: 59 | Admitting: Internal Medicine

## 2011-02-07 ENCOUNTER — Encounter: Payer: Self-pay | Admitting: Internal Medicine

## 2011-02-07 VITALS — BP 106/78 | HR 79 | Ht 64.0 in | Wt 183.0 lb

## 2011-02-07 DIAGNOSIS — J3089 Other allergic rhinitis: Secondary | ICD-10-CM

## 2011-02-07 DIAGNOSIS — L501 Idiopathic urticaria: Secondary | ICD-10-CM

## 2011-02-07 NOTE — Progress Notes (Signed)
Subjective:    Patient ID: Susan Roth, female    DOB: [birth year], 35 y.o.   MRN: [id number]  HPI  12/15/10- 35 yoF never smoker referred by Dr Debby Bud for allergy evaluation. Describes onset urticaria 3 months ago, shortly after starting Wellbutrin. Similar hives when she took it before, several years ago. Now off 3 months, but still experiencing at least one lesion daily. She also started Yaz hormonal med for acne control about the same time.  Also c/o ears pop, glands/ throat feel full. Motrin helps this feeling. Morning sinus pressure, teeth hurt, throat scratchy, - worst in mid-summer. Zyrtec some help. Had tonsils out Feb, 2011 trying to address this feeling. Denies chest discomfort, cough or wheeze. Eczema as a child. Family hx allergies- father hayfever, daughter asthma. Bananas make throat itch. No other food and no insect sting reaction.  CT sinuses 10/12/08- clear House on slab, no mold, central AC, 2 dogs in, no smokers.  02/07/11-35 year old female never smoker followed for Urticaria, Rhinitis She had stopped Wellbutrin in March and was having less urticaria when I first saw her in July. She has not stopped Yaz. She still gets an occasional hive,  but much less. She is more aware of rhinitis now with nasal congestion and drainage. She had been using nasal spray plus Benadryl plus Zyrtec but did not try adding an acid blocker antihistamine as suggested last visit. We discussed environmental exposures. She tells me ceiling fell at work after rain, suggesting possibility that there had been water leaks and significant mold exposure, but we don't know for sure. Allergy profile 12/15/2010 total IgE 213.5 with many specific elevations. For skin test today- strongly positive for many common inhalant allergens.   Review of Systems  Constitutional:   No-   weight loss, night sweats, fevers, chills, fatigue, lassitude. HEENT:   No-   headaches, difficulty swallowing, tooth/dental problems,               CV:  No- chest pain, orthopnea, PND, swelling in lower extremities, anasarca, dizziness, palpitations GI:  No-   heartburn, indigestion, abdominal pain, nausea, vomiting, diarrhea,                 change in bowel habits, loss of appetite Resp: No-   shortness of breath with exertion or at rest.  No-  excess mucus,             No-   productive cough,  No non-productive cough,  No-  coughing up of blood.              No-   change in color of mucus.  No- wheezing.   Skin- per HPI GU: No-   dysuria, change in color of urine, no urgency or frequency.  No- flank pain. MS:  No-   joint pain or swelling.  No- decreased range of motion.  No- back pain. Psych:  No- change in mood or affect. No depression or anxiety.  No memory loss.     Objective:   Physical Exam  General- Alert, Oriented, Affect-appropriate, Distress- none acute Skin- rash-none, cystic acne face, excoriation- none.   No visible hives today. Lymphadenopathy- none Head- atraumatic            Eyes- Gross vision intact, PERRLA, conjunctivae clear secretions            Ears- Hearing, canals normal            Nose- Mucus bridging,No- Septal dev,, polyps, erosion,  perforation             Throat- Mallampati II , mucosa clear , drainage- none, tonsils- atrophic Neck- flexible , trachea midline, no stridor , thyroid nl, carotid no bruit Chest - symmetrical excursion , unlabored           Heart/CV- RRR , no murmur , no gallop  , no rub, nl s1 s2                           - JVD- none , edema- none, stasis changes- none, varices- none           Lung- clear to P&A, wheeze- none, cough- none , dullness-none, rub- none           Chest wall-  Abd- tender-no, distended-no, bowel sounds-present, HSM- no Br/ Gen/ Rectal- Not done, not indicated Extrem- cyanosis- none, clubbing, none, atrophy- none, strength- nl Neuro- grossly intact to observation         Assessment & Plan:   No problem-specific assessment & plan notes found  for this encounter.

## 2011-02-07 NOTE — Patient Instructions (Signed)
Ok to continue antihistamines and nasal sprays as long as they are good enough. If hives get worse, remember you can try adding a different class of antihistamine- otc zantac or pepcid.  If you don't do well enough, then we can consider trying allergy shots.

## 2011-02-11 NOTE — Assessment & Plan Note (Signed)
Urticaria seems to be fading away very slowly. She is an atopic person as noted. This problem more likely relates used to use of the Wellbutrin which has caused hives before, or to her hormone medication he acts. I have encouraged her to stop he asked for a month or 2 if possible. Meanwhile she can continue antihistamines which I reviewed again. She is not nearly concerned now as she was earlier.

## 2011-02-11 NOTE — Assessment & Plan Note (Signed)
She is strongly atopic. This relates most correctly to her rhinitis symptoms. She feels she can control those adequately for now with antihistamines and a nasal steroid spray. We have discussed environmental controls. Allergy vaccine is a definite option if needed later.

## 2011-02-12 LAB — WET PREP, GENITAL
Clue Cells Wet Prep HPF POC: NONE SEEN
Yeast Wet Prep HPF POC: NONE SEEN

## 2011-02-15 ENCOUNTER — Encounter: Payer: Self-pay | Admitting: Internal Medicine

## 2011-04-13 ENCOUNTER — Encounter (HOSPITAL_COMMUNITY): Payer: Self-pay

## 2011-04-13 ENCOUNTER — Inpatient Hospital Stay (HOSPITAL_COMMUNITY)
Admission: AD | Admit: 2011-04-13 | Discharge: 2011-04-13 | Disposition: A | Discharge status: Home / Self Care | Payer: 59 | Source: Ambulatory Visit | Attending: Obstetrics & Gynecology | Admitting: Obstetrics & Gynecology

## 2011-04-13 DIAGNOSIS — R1013 Epigastric pain: Secondary | ICD-10-CM | POA: Insufficient documentation

## 2011-04-13 DIAGNOSIS — K648 Other hemorrhoids: Secondary | ICD-10-CM

## 2011-04-13 DIAGNOSIS — N923 Ovulation bleeding: Secondary | ICD-10-CM

## 2011-04-13 DIAGNOSIS — R1011 Right upper quadrant pain: Secondary | ICD-10-CM

## 2011-04-13 DIAGNOSIS — K625 Hemorrhage of anus and rectum: Secondary | ICD-10-CM | POA: Insufficient documentation

## 2011-04-13 LAB — WET PREP, GENITAL
Trich, Wet Prep: NONE SEEN
Yeast Wet Prep HPF POC: NONE SEEN

## 2011-04-13 LAB — URINALYSIS, ROUTINE W REFLEX MICROSCOPIC
Bilirubin Urine: NEGATIVE
Glucose, UA: NEGATIVE mg/dL
Hgb urine dipstick: NEGATIVE
Ketones, ur: NEGATIVE mg/dL
Leukocytes, UA: NEGATIVE
Specific Gravity, Urine: 1.02 (ref 1.005–1.030)
Urobilinogen, UA: 0.2 mg/dL (ref 0.0–1.0)

## 2011-04-13 LAB — COMPREHENSIVE METABOLIC PANEL
AST: 12 U/L (ref 0–37)
BUN: 10 mg/dL (ref 6–23)
CO2: 28 mEq/L (ref 19–32)
Calcium: 9.4 mg/dL (ref 8.4–10.5)
Chloride: 99 mEq/L (ref 96–112)
Creatinine, Ser: 0.79 mg/dL (ref 0.50–1.10)
GFR calc Af Amer: >90 mL/min (ref >90)
GFR calc non Af Amer: >90 mL/min (ref >90)
Glucose, Bld: 93 mg/dL (ref 70–99)
Total Bilirubin: 0.2 mg/dL — ABNORMAL LOW (ref 0.3–1.2)

## 2011-04-13 LAB — CBC
HCT: 39.2 % (ref 36.0–46.0)
Hemoglobin: 13.6 g/dL (ref 12.0–15.0)
MCH: 32.3 pg (ref 26.0–34.0)
MCV: 93.1 fL (ref 78.0–100.0)
RBC: 4.21 MIL/uL (ref 3.87–5.11)
WBC: 4.4 10*3/uL (ref 4.0–10.5)

## 2011-04-13 NOTE — ED Provider Notes (Signed)
History   Susan Roth is a 35 y.o. year old [id number] female who presents to [facility] reporting: 1. RUQ pain that wraps around to her back x 1 week, worse after eating (4-6/10 pain) 2. Nausea after eating x 1 week 3. Malaise x 2 weeks 4. Two episodes of bleeding after BM--not sure if is was rectal or vaginal bleeding.  She was seen at an urgent care 1 weeks ago for dysuria and right flank pain and was Tx w/ Cipro. Dysuria resolved.  CSN: [id number] Arrival date & time: 04/13/2011  2:32 PM   None     Chief Complaint  Patient presents with  . Abdominal Pain    (Consider location/radiation/quality/duration/timing/severity/associated sxs/prior treatment) HPI  Past Medical History  Diagnosis Date  . Allergic rhinitis   . Herpes zoster without mention of complication   . URI (upper respiratory infection)   . Dysfunction of eustachian tube   . Impacted cerumen   Chronic constipation Kidney stones  Past Surgical History  Procedure Date  . Wisdom tooth extraction   . Cesarean section     x1  . Tonsillectomy     as an adult - 2011    Family History  Problem Relation Age of Onset  . Hyperlipidemia Mother   . Hypertension Mother   . Nephrolithiasis Mother   . Crohn's disease Mother   . Nephrolithiasis Father   . Depression Father     bipolar  . Asthma Other   . Allergies Other   . Allergies Other   . Clotting disorder Other   . Cancer Other     Cervical  . Arthritis Other   . Cancer Other     Colon  . Diabetes Other     History  Substance Use Topics  . Smoking status: Never Smoker   . Smokeless tobacco: Not on file   Comment: positive hx of passive tobacco smoke exposure  . Alcohol Use: 0.6 oz/week    1 Glasses of wine per week    OB History    Grav Para Term Preterm Abortions TAB SAB Ect Mult Living   1 1 0 1 0 0 0 0 0 1       Review of Systems  Constitutional: Positive for chills and fatigue. Negative for fever, appetite change and unexpected weight  change.  Cardiovascular: Negative for chest pain.  Gastrointestinal: Positive for nausea, abdominal pain (RUQ), constipation and blood in stool (possible). Negative for vomiting, diarrhea, abdominal distention and rectal pain.  Genitourinary: Positive for flank pain and vaginal bleeding (possible). Negative for dysuria, urgency, frequency, hematuria, vaginal discharge, difficulty urinating and menstrual problem.    Allergies  Amoxicillin; Penicillins; and Wellbutrin  Home Medications  No current outpatient prescriptions on file.  BP 123/95  Pulse 71  Temp(Src) 98.3 F (36.8 C) (Oral)  Resp 20  Ht 5\' 4"  (1.626 m)  Wt 80.196 kg (176 lb 12.8 oz)  BMI 30.35 kg/m2  SpO2 99%  LMP 03/18/2011  Physical Exam  Nursing note and vitals reviewed. Constitutional: She is oriented to person, place, and time. She appears well-developed and well-nourished. No distress.  Cardiovascular: Normal rate.   Pulmonary/Chest: Effort normal.  Abdominal: Soft. Bowel sounds are normal. She exhibits no mass. There is tenderness. There is no rebound and no guarding (mild suprapubic).  Genitourinary: Rectal exam shows internal hemorrhoid (x1 small). Rectal exam shows no external hemorrhoid. There is no lesion on the right labia. There is no lesion on the left labia. Uterus is  not tender. Cervix exhibits friability. Cervix exhibits no motion tenderness and no discharge. Right adnexum displays no tenderness. Left adnexum displays no tenderness. No bleeding around the vagina. No vaginal discharge found.       Pelvic exam performed by Henrietta Hoover, PA  Neurological: She is alert and oriented to person, place, and time.  Skin: Skin is warm and dry.  Psychiatric: She has a normal mood and affect.    ED Course  Procedures (including critical care time)  Results for orders placed during the hospital encounter of 04/13/11 (from the past 24 hour(s))  URINALYSIS, ROUTINE W REFLEX MICROSCOPIC     Status: Normal    Collection Time   04/13/11  3:30 PM      Component Value Range   Color, Urine YELLOW  YELLOW    Appearance CLEAR  CLEAR    Specific Gravity, Urine 1.020  1.005 - 1.030    pH 7.5  5.0 - 8.0    Glucose, UA NEGATIVE  NEGATIVE (mg/dL)   Hgb urine dipstick NEGATIVE  NEGATIVE    Bilirubin Urine NEGATIVE  NEGATIVE    Ketones, ur NEGATIVE  NEGATIVE (mg/dL)   Protein, ur NEGATIVE  NEGATIVE (mg/dL)   Urobilinogen, UA 0.2  0.0 - 1.0 (mg/dL)   Nitrite NEGATIVE  NEGATIVE    Leukocytes, UA NEGATIVE  NEGATIVE   POCT PREGNANCY, URINE     Status: Normal   Collection Time   04/13/11  3:56 PM      Component Value Range   Preg Test, Ur NEGATIVE    CBC     Status: Normal   Collection Time   04/13/11  4:40 PM      Component Value Range   WBC 4.4  4.0 - 10.5 (K/uL)   RBC 4.21  3.87 - 5.11 (MIL/uL)   Hemoglobin 13.6  12.0 - 15.0 (g/dL)   HCT 21.3  08.6 - 57.8 (%)   MCV 93.1  78.0 - 100.0 (fL)   MCH 32.3  26.0 - 34.0 (pg)   MCHC 34.7  30.0 - 36.0 (g/dL)   RDW 46.9  62.9 - 52.8 (%)   Platelets 328  150 - 400 (K/uL)  COMPREHENSIVE METABOLIC PANEL     Status: Abnormal   Collection Time   04/13/11  4:40 PM      Component Value Range   Sodium 133 (*) 135 - 145 (mEq/L)   Potassium 3.6  3.5 - 5.1 (mEq/L)   Chloride 99  96 - 112 (mEq/L)   CO2 28  19 - 32 (mEq/L)   Glucose, Bld 93  70 - 99 (mg/dL)   BUN 10  6 - 23 (mg/dL)   Creatinine, Ser 4.13  0.50 - 1.10 (mg/dL)   Calcium 9.4  8.4 - 24.4 (mg/dL)   Total Protein 7.0  6.0 - 8.3 (g/dL)   Albumin 3.5  3.5 - 5.2 (g/dL)   AST 12  0 - 37 (U/L)   ALT 9  0 - 35 (U/L)   Alkaline Phosphatase 50  39 - 117 (U/L)   Total Bilirubin 0.2 (*) 0.3 - 1.2 (mg/dL)   GFR calc non Af Amer >90  >90 (mL/min)   GFR calc Af Amer >90  >90 (mL/min)    MDM  Assessment: 1. Epigastric pain w/ no evidence of Gal Bladder Dz. 2. Rectal bleeding vs intermenstrual spotting.  Plan: 1. F/U w/ PCP or ED for worsening Sx. 2. F/U w/ Dr. Aldona Bar if intermenstrual spotting is  verified 3.  Increase fluid and fiber and void straining w/ BMs  Robynne Roat 04/13/2011 5:26 PM

## 2011-04-13 NOTE — Progress Notes (Signed)
Patient states she was treated for a UTI one week ago at an Urgent Care on [address]. Has taken medication as instructed. Has been having upper abdominal pain that radiated into her back that is not going away. Has had bleeding but not sure if in urine or stool after a bowel movement. Has been having this pain for a couple of months. Has a history of a kidney stone.

## 2011-04-14 LAB — GC/CHLAMYDIA PROBE AMP, GENITAL: GC Probe Amp, Genital: NEGATIVE

## 2011-04-28 ENCOUNTER — Emergency Department (HOSPITAL_COMMUNITY): Payer: 59

## 2011-04-28 ENCOUNTER — Encounter (HOSPITAL_COMMUNITY): Payer: Self-pay | Admitting: Emergency Medicine

## 2011-04-28 ENCOUNTER — Encounter (HOSPITAL_COMMUNITY): Payer: Self-pay | Admitting: *Deleted

## 2011-04-28 ENCOUNTER — Emergency Department (INDEPENDENT_AMBULATORY_CARE_PROVIDER_SITE_OTHER)
Admission: EM | Admit: 2011-04-28 | Discharge: 2011-04-28 | Disposition: A | Discharge status: Nursing Home (Includes ICF) | Payer: 59 | Source: Home / Self Care | Attending: Emergency Medicine | Admitting: Emergency Medicine

## 2011-04-28 ENCOUNTER — Emergency Department (HOSPITAL_COMMUNITY)
Admission: EM | Admit: 2011-04-28 | Discharge: 2011-04-29 | Disposition: A | Discharge status: Home / Self Care | Payer: 59 | Attending: Emergency Medicine | Admitting: Emergency Medicine

## 2011-04-28 DIAGNOSIS — Z87442 Personal history of urinary calculi: Secondary | ICD-10-CM | POA: Insufficient documentation

## 2011-04-28 DIAGNOSIS — M25519 Pain in unspecified shoulder: Secondary | ICD-10-CM | POA: Insufficient documentation

## 2011-04-28 DIAGNOSIS — R1011 Right upper quadrant pain: Secondary | ICD-10-CM

## 2011-04-28 DIAGNOSIS — R142 Eructation: Secondary | ICD-10-CM | POA: Insufficient documentation

## 2011-04-28 DIAGNOSIS — R141 Gas pain: Secondary | ICD-10-CM | POA: Insufficient documentation

## 2011-04-28 DIAGNOSIS — R10811 Right upper quadrant abdominal tenderness: Secondary | ICD-10-CM | POA: Insufficient documentation

## 2011-04-28 DIAGNOSIS — R110 Nausea: Secondary | ICD-10-CM | POA: Insufficient documentation

## 2011-04-28 LAB — POCT PREGNANCY, URINE: Preg Test, Ur: NEGATIVE

## 2011-04-28 LAB — COMPREHENSIVE METABOLIC PANEL
Alkaline Phosphatase: 46 U/L (ref 39–117)
BUN: 9 mg/dL (ref 6–23)
Chloride: 104 mEq/L (ref 96–112)
GFR calc Af Amer: >90 mL/min (ref >90)
GFR calc non Af Amer: >90 mL/min (ref >90)
Glucose, Bld: 90 mg/dL (ref 70–99)
Potassium: 3.9 mEq/L (ref 3.5–5.1)
Total Bilirubin: 0.3 mg/dL (ref 0.3–1.2)

## 2011-04-28 LAB — POCT URINALYSIS DIP (DEVICE)
Bilirubin Urine: NEGATIVE
Leukocytes, UA: NEGATIVE
Specific Gravity, Urine: 1.02 (ref 1.005–1.030)
pH: 5.5 (ref 5.0–8.0)

## 2011-04-28 LAB — DIFFERENTIAL
Lymphs Abs: 2.8 10*3/uL (ref 0.7–4.0)
Monocytes Absolute: 0.4 10*3/uL (ref 0.1–1.0)
Monocytes Relative: 7 % (ref 3–12)
Neutro Abs: 2.6 10*3/uL (ref 1.7–7.7)
Neutrophils Relative %: 44 % (ref 43–77)

## 2011-04-28 LAB — CBC
HCT: 39 % (ref 36.0–46.0)
Hemoglobin: 13.3 g/dL (ref 12.0–15.0)
MCH: 31.6 pg (ref 26.0–34.0)
RBC: 4.21 MIL/uL (ref 3.87–5.11)

## 2011-04-28 LAB — LIPASE, BLOOD: Lipase: 21 U/L (ref 11–59)

## 2011-04-28 MED ORDER — OXYCODONE-ACETAMINOPHEN 5-325 MG PO TABS
2.0000 | ORAL_TABLET | Freq: Once | ORAL | Status: AC
  Administered 2011-04-28: 2 via ORAL
  Filled 2011-04-28: qty 2

## 2011-04-28 NOTE — ED Notes (Signed)
Pt seen and treated one month ago for same type symptoms - took antibiotic as directed - now with similar symptoms never completely resolved

## 2011-04-28 NOTE — ED Provider Notes (Addendum)
History     CSN: [id number] Arrival date & time: 04/28/2011  5:16 PM   First MD Initiated Contact with Patient 04/28/11 1550      Chief Complaint  Patient presents with  . Dysuria  . Abdominal Pain  . Back Pain  . Arm Pain    (Consider location/radiation/quality/duration/timing/severity/associated sxs/prior treatment) HPI Comments: Been having this painfor almost a year have seen my doctor, a couple of times but my labs come back normal" Its been hurting even more in the last couple of days the pain radiates to my R upper back, No fevers, No nausea, No vomiting"  Patient is a 35 y.o. female presenting with abdominal pain.  Abdominal Pain The primary symptoms of the illness include abdominal pain. The primary symptoms of the illness do not include nausea, vomiting, dysuria, vaginal discharge or vaginal bleeding. The problem has been gradually worsening.  The patient states that she believes she is currently not pregnant. The patient has not had a change in bowel habit. Additional symptoms associated with the illness include back pain. Symptoms associated with the illness do not include constipation or urgency. Significant associated medical issues do not include GERD, inflammatory bowel disease, sickle cell disease or diverticulitis.    Past Medical History  Diagnosis Date  . Allergic rhinitis   . Herpes zoster without mention of complication   . URI (upper respiratory infection)   . Dysfunction of eustachian tube   . Impacted cerumen   . Kidney calculi     Past Surgical History  Procedure Date  . Wisdom tooth extraction   . Cesarean section     x1  . Tonsillectomy     as an adult - 2011    Family History  Problem Relation Age of Onset  . Hyperlipidemia Mother   . Hypertension Mother   . Nephrolithiasis Mother   . Crohn's disease Mother   . Nephrolithiasis Father   . Depression Father     bipolar  . Asthma Other   . Allergies Other   . Allergies Other   .  Clotting disorder Other   . Cancer Other     Cervical  . Arthritis Other   . Cancer Other     Colon  . Diabetes Other     History  Substance Use Topics  . Smoking status: Never Smoker   . Smokeless tobacco: Not on file   Comment: positive hx of passive tobacco smoke exposure  . Alcohol Use: 0.6 oz/week    1 Glasses of wine per week    OB History    Grav Para Term Preterm Abortions TAB SAB Ect Mult Living   1 1 0 1 0 0 0 0 0 1       Review of Systems  Gastrointestinal: Positive for abdominal pain. Negative for nausea, vomiting and constipation.  Genitourinary: Negative for dysuria, urgency, vaginal bleeding and vaginal discharge.  Musculoskeletal: Positive for back pain.    Allergies  Amoxicillin; Penicillins; and Wellbutrin  Home Medications   Current Outpatient Rx  Name Route Sig Dispense Refill  . BEE POLLEN 500 MG PO TABS Oral Take 500 mg by mouth daily. Takes 2 250mg  tablets daily     . CETIRIZINE HCL 10 MG PO TABS Oral Take 10 mg by mouth daily as needed. For allergies    . DROSPIRENONE-ETHINYL ESTRADIOL 3-0.02 MG PO TABS Oral Take 1 tablet by mouth daily. 30 tablet 11    Used for acne vulgaris  . FLUTICASONE PROPIONATE 50  MCG/ACT NA SUSP Nasal Place 2 sprays into the nose daily as needed. For congestion with allergies. Use 2 sprays in each nostril when needed.      BP 124/71  Pulse 88  Temp(Src) 99.7 F (37.6 C) (Oral)  Resp 16  SpO2 100%  LMP 03/18/2011  Physical Exam  Nursing note and vitals reviewed. Constitutional: She appears well-developed and well-nourished. No distress.  HENT:  Head: Normocephalic.  Eyes: Conjunctivae are normal.  Pulmonary/Chest: Effort normal and breath sounds normal. No respiratory distress.  Abdominal: Soft. Bowel sounds are normal. She exhibits no mass. There is tenderness in the right upper quadrant. There is rebound and positive Murphy's sign. There is no rigidity and no guarding.    Neurological: She is alert.     ED Course  Procedures (including critical care time)  Labs Reviewed  POCT URINALYSIS DIP (DEVICE) - Abnormal; Notable for the following:    Ketones, ur TRACE (*)    All other components within normal limits  POCT PREGNANCY, URINE   No results found.   1. Right upper quadrant abdominal pain       MDM  Ongoing RUQ with * murphy sign        Jimmie Molly, MD 04/28/11 [id number]  Jimmie Molly, MD 04/28/11 2243

## 2011-04-28 NOTE — ED Provider Notes (Signed)
History     CSN: [id number] Arrival date & time: 04/28/2011  6:39 PM   First MD Initiated Contact with Patient 04/28/11 2024      Chief Complaint  Patient presents with  . Abdominal Pain    (Consider location/radiation/quality/duration/timing/severity/associated sxs/prior Treatment)  Symptoms occur within one hour of "heavy" meals. No dysuria, flank pain or vaginal d/c. She has been tx recently for a UTI without change in her RUQ pain. She has been worked up at [facility] for same in November 2012 and had negative labs. Abd [place] in 2011 was also negative for cholelithiasis Abdominal Pain This is a recurrent problem. The current episode started more than 1 month ago. The onset quality is gradual. The problem occurs every several days. The problem has been gradually worsening. The pain is located in the RUQ. The pain is at a severity of 7/10. The pain is moderate. The quality of the pain is aching and a sensation of fullness (pressure). The abdominal pain radiates to the epigastric region and right shoulder. Associated symptoms include belching and nausea. Pertinent negatives include no anorexia, constipation, diarrhea, dysuria, fever, flatus, frequency or vomiting. The pain is aggravated by palpation and movement. The pain is relieved by nothing. She has tried nothing for the symptoms. Prior diagnostic workup includes ultrasound (Prior labs have been unremarkable. [place] greater than one year ago showed no gallstones). Her past medical history is significant for abdominal surgery. There is no history of colon cancer, Crohn's disease, gallstones, GERD, irritable bowel syndrome, pancreatitis, PUD or ulcerative colitis. c-section  Patient is a 35 y.o. female presenting with abdominal pain.  Abdominal Pain The primary symptoms of the illness include abdominal pain and nausea. The primary symptoms of the illness do not include fever, shortness of breath, vomiting, diarrhea, dysuria, vaginal discharge  or vaginal bleeding. The current episode started more than 1 month ago. The onset of the illness was gradual. The problem has been gradually worsening.  The abdominal pain radiates to the epigastric region and right shoulder.  Symptoms associated with the illness do not include chills, anorexia, diaphoresis, constipation, urgency or frequency. Significant associated medical issues do not include PUD, GERD or gallstones.    Past Medical History  Diagnosis Date  . Allergic rhinitis   . Herpes zoster without mention of complication   . URI (upper respiratory infection)   . Dysfunction of eustachian tube   . Impacted cerumen   . Kidney calculi     Past Surgical History  Procedure Date  . Wisdom tooth extraction   . Cesarean section     x1  . Tonsillectomy     as an adult - 2011    Family History  Problem Relation Age of Onset  . Hyperlipidemia Mother   . Hypertension Mother   . Nephrolithiasis Mother   . Crohn's disease Mother   . Nephrolithiasis Father   . Depression Father     bipolar  . Asthma Other   . Allergies Other   . Allergies Other   . Clotting disorder Other   . Cancer Other     Cervical  . Arthritis Other   . Cancer Other     Colon  . Diabetes Other     History  Substance Use Topics  . Smoking status: Never Smoker   . Smokeless tobacco: Not on file   Comment: positive hx of passive tobacco smoke exposure  . Alcohol Use: 0.6 oz/week    1 Glasses of wine per week  OB History    Grav Para Term Preterm Abortions TAB SAB Ect Mult Living   1 1 0 1 0 0 0 0 0 1       Review of Systems  Constitutional: Negative for fever, chills, diaphoresis and appetite change.  Respiratory: Negative for cough and shortness of breath.   Cardiovascular: Negative for chest pain.  Gastrointestinal: Positive for nausea and abdominal pain. Negative for vomiting, diarrhea, constipation, abdominal distention, anorexia and flatus.  Genitourinary: Negative for dysuria,  urgency, frequency, flank pain, vaginal bleeding, vaginal discharge, difficulty urinating and pelvic pain.  Psychiatric/Behavioral: Negative.     Allergies  Amoxicillin; Penicillins; and Wellbutrin  Home Medications   Current Outpatient Rx  Name Route Sig Dispense Refill  . BEE POLLEN 500 MG PO TABS Oral Take 500 mg by mouth daily. Takes 2 250mg  tablets daily     . CETIRIZINE HCL 10 MG PO TABS Oral Take 10 mg by mouth daily as needed. For allergies    . DROSPIRENONE-ETHINYL ESTRADIOL 3-0.02 MG PO TABS Oral Take 1 tablet by mouth daily. 30 tablet 11    Used for acne vulgaris  . FLUTICASONE PROPIONATE 50 MCG/ACT NA SUSP Nasal Place 2 sprays into the nose daily as needed. For congestion with allergies. Use 2 sprays in each nostril when needed.      BP 122/74  Pulse 85  Temp(Src) 97.9 F (36.6 C) (Oral)  Resp 16  SpO2 100%  LMP 03/18/2011  Physical Exam  Constitutional: She appears well-developed and well-nourished.       Uncomfortable appearing  HENT:  Head: Normocephalic and atraumatic.  Eyes: Conjunctivae and EOM are normal. Pupils are equal, round, and reactive to light. No scleral icterus.  Neck: Normal range of motion. Neck supple.  Cardiovascular: Normal rate, regular rhythm and normal heart sounds.  Exam reveals no gallop and no friction rub.   No murmur heard. Pulmonary/Chest: Effort normal and breath sounds normal. No respiratory distress. She has no wheezes. She has no rales. She exhibits no tenderness.  Abdominal: Soft. Normal appearance and bowel sounds are normal. She exhibits no distension, no ascites and no mass. There is no hepatosplenomegaly. There is tenderness in the right upper quadrant. There is no rigidity, no rebound, no guarding, no CVA tenderness, no tenderness at McBurney's point and negative Murphy's sign. No hernia.    ED Course  Procedures (including critical care time)  12:41 AM Patient reassessed in dept. Abd soft, benign, nontender, no peritoneal  signs Labs Reviewed  DIFFERENTIAL - Abnormal; Notable for the following:    Lymphocytes Relative 48 (*)    All other components within normal limits  COMPREHENSIVE METABOLIC PANEL - Abnormal; Notable for the following:    Albumin 3.4 (*)    All other components within normal limits  CBC  LIPASE, BLOOD   US Abdomen Complete  04/28/2011  *RADIOLOGY REPORT*  Clinical Data:  Right upper quadrant pain  COMPLETE ABDOMINAL ULTRASOUND  Comparison:  12/05/2009  Findings:  Gallbladder:  No gallstones, gallbladder wall thickening, or pericholecystic fluid.  Common bile duct:  Normal caliber, measuring about 4 mm diameter.  Liver:  No focal lesion identified.  Within normal limits in parenchymal echogenicity.Color flow Doppler images of the main portal and hepatic veins show flow in the appropriate directions.  IVC:  Appears normal.  Pancreas:  No focal abnormality seen.  Spleen:  Spleen length measures about 3.3 cm.  Normal focal parenchymal echotexture.  Right Kidney:  Right kidney measures 10.3 cm length.  No hydronephrosis.  Left Kidney:  I kidney measures 9.8 cm length.  No hydronephrosis.  Abdominal aorta:  No aneurysm identified.  IMPRESSION: Negative study.  No evidence of cholecystitis or cholelithiasis.  Original Report Authenticated By: Marlon Pel, M.D.     1. Abdominal pain       MDM  Negative ABD [place] and reassuring labs. No evidence of cholelithiasis or cholecystitis. It may be beneficial to have a HIDA scan in the future and this was discussed with the patient. UA from urgent care reviewed and was negative for UTI or signs of renal colic. No s/s c/w GU pathology. Doubt Fitz-Hugh-Curtis. Patient improved with PO meds in dept and was comfortable with d/c home to PMD/GI follow up.           Marcell Anger, [place] 04/29/11 [phone]

## 2011-04-28 NOTE — ED Notes (Signed)
Pt sent from urgent care for further eval of right upper abdominal pain. Pt reports symptoms ongoing x 1 year. Pt recently had kidney infection.

## 2011-04-28 NOTE — ED Notes (Signed)
MD at bedside. 

## 2011-04-28 NOTE — ED Notes (Signed)
Report given to Dallas Schimke triage nurse Dewey ed

## 2011-04-29 NOTE — ED Provider Notes (Signed)
Medical screening examination/treatment/procedure(s) were performed by non-physician practitioner and as supervising physician I was immediately available for consultation/collaboration.   Lyanne Co, MD 04/29/11 [phone]

## 2011-05-01 ENCOUNTER — Ambulatory Visit (INDEPENDENT_AMBULATORY_CARE_PROVIDER_SITE_OTHER): Payer: 59 | Admitting: Internal Medicine

## 2011-05-01 ENCOUNTER — Encounter: Payer: Self-pay | Admitting: Internal Medicine

## 2011-05-01 VITALS — BP 104/74 | HR 100 | Temp 98.3°F | Resp 20 | Ht 64.0 in | Wt 174.5 lb

## 2011-05-01 DIAGNOSIS — L02429 Furuncle of limb, unspecified: Secondary | ICD-10-CM

## 2011-05-01 MED ORDER — DOXYCYCLINE HYCLATE 100 MG PO TABS: 100.0000 mg | ORAL_TABLET | Freq: Two times a day (BID) | ORAL | Status: AC

## 2011-05-01 NOTE — Patient Instructions (Signed)
Boil on the leg: wound and leg care - wash with soap and water, rinse, dry and apply large bandaide. Do this twice a day.                         May use low level heat when at home with your leg elevated. Bayer heat patch, heating pad with cloth between skin or other                          Doxycycline 100 mg twice a day for 10 days - antibiotic Call for purulent drainage, fever, red streaks up the leg.   Abscess An abscess (boil or furuncle) is an infected area that contains a collection of pus.   SYMPTOMS Signs and symptoms of an abscess include pain, tenderness, redness, or hardness. You may feel a moveable soft area under your skin. An abscess can occur anywhere in the body.   TREATMENT   A surgical cut (incision) may be made over your abscess to drain the pus. Gauze may be packed into the space or a drain may be looped through the abscess cavity (pocket). This provides a drain that will allow the cavity to heal from the inside outwards. The abscess may be painful for a few days, but should feel much better if it was drained.   Your abscess, if seen early, may not have localized and may not have been drained. If not, another appointment may be required if it does not get better on its own or with medications. HOME CARE INSTRUCTIONS    Only take over-the-counter or prescription medicines for pain, discomfort, or fever as directed by your caregiver.     Take your antibiotics as directed if they were prescribed. Finish them even if you start to feel better.     Keep the skin and clothes clean around your abscess.     If the abscess was drained, you will need to use gauze dressing to collect any draining pus. Dressings will typically need to be changed 3 or more times a day.     The infection may spread by skin contact with others. Avoid skin contact as much as possible.     Practice good hygiene. This includes regular hand washing, cover any draining skin lesions, and do not share  personal care items.     If you participate in sports, do not share athletic equipment, towels, whirlpools, or personal care items. Shower after every practice or tournament.     If a draining area cannot be adequately covered:     Do not participate in sports.     Children should not participate in day care until the wound has healed or drainage stops.     If your caregiver has given you a follow-up appointment, it is very important to keep that appointment. Not keeping the appointment could result in a much worse infection, chronic or permanent injury, pain, and disability. If there is any problem keeping the appointment, you must call back to this facility for assistance.  SEEK MEDICAL CARE IF:    You develop increased pain, swelling, redness, drainage, or bleeding in the wound site.     You develop signs of generalized infection including muscle aches, chills, fever, or a general ill feeling.     You have an oral temperature above 102 F (38.9 C).  MAKE SURE YOU:    Understand these instructions.  Will watch your condition.     Will get help right away if you are not doing well or get worse.  Document Released: 02/14/2005 Document Revised: 01/17/2011 Document Reviewed: 12/09/2007 [facility] Patient Information 2012 [place], [place].
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
## 2011-07-16 ENCOUNTER — Telehealth: Payer: Self-pay

## 2011-07-16 ENCOUNTER — Ambulatory Visit (INDEPENDENT_AMBULATORY_CARE_PROVIDER_SITE_OTHER): Payer: 59 | Admitting: Internal Medicine

## 2011-07-16 ENCOUNTER — Encounter: Payer: Self-pay | Admitting: Internal Medicine

## 2011-07-16 VITALS — BP 100/70 | HR 103 | Temp 98.0°F | Ht 64.0 in | Wt 187.1 lb

## 2011-07-16 DIAGNOSIS — R062 Wheezing: Secondary | ICD-10-CM | POA: Insufficient documentation

## 2011-07-16 DIAGNOSIS — J209 Acute bronchitis, unspecified: Secondary | ICD-10-CM | POA: Insufficient documentation

## 2011-07-16 MED ORDER — HYDROCODONE-HOMATROPINE 5-1.5 MG/5ML PO SYRP: 5.0000 mL | ORAL_SOLUTION | Freq: Four times a day (QID) | ORAL | Status: AC | PRN

## 2011-07-16 MED ORDER — AZITHROMYCIN 250 MG PO TABS: ORAL_TABLET | ORAL | Status: AC

## 2011-07-16 MED ORDER — PREDNISONE 10 MG PO TABS: ORAL_TABLET | ORAL | Status: DC

## 2011-07-16 NOTE — Patient Instructions (Signed)
Take all new medications as prescribed Continue all other medications as before  

## 2011-07-16 NOTE — Assessment & Plan Note (Signed)
Mild to mod, for antibx course,  to f/u any worsening symptoms or concerns 

## 2011-07-16 NOTE — Assessment & Plan Note (Signed)
Mild to mod, for predpack  course,  to f/u any worsening symptoms or concerns 

## 2011-07-16 NOTE — Progress Notes (Signed)
  Subjective:    Patient ID: Susan Roth, female    DOB: 04-29-1976, 36 y.o.   MRN: [id number]  HPI  Here with acute onset mild to mod 2-3 days ST, HA, general weakness and malaise, with prod cough greenish sputum, but Pt denies chest pain, increased sob or doe, wheezing, orthopnea, PND, increased LE swelling, palpitations, dizziness or syncope, except for onset mild wheezing without sob today Past Medical History  Diagnosis Date  . Allergic rhinitis   . Herpes zoster without mention of complication   . URI (upper respiratory infection)   . Dysfunction of eustachian tube   . Impacted cerumen   . Kidney calculi    Past Surgical History  Procedure Date  . Wisdom tooth extraction   . Cesarean section     x1  . Tonsillectomy     as an adult - 2011    reports that she has never smoked. She does not have any smokeless tobacco history on file. She reports that she drinks about .6 ounces of alcohol per week. She reports that she does not use illicit drugs. family history includes Allergies in her others; Arthritis in her other; Asthma in her other; Cancer in her others; Clotting disorder in her other; Crohn's disease in her mother; Depression in her father; Diabetes in her other; Hyperlipidemia in her mother; Hypertension in her mother; and Nephrolithiasis in her father and mother. Allergies  Allergen Reactions  . Amoxicillin Hives  . Penicillins Hives  . Wellbutrin (Bupropion Hcl) Hives   Current Outpatient Prescriptions on File Prior to Visit  Medication Sig Dispense Refill  . drospirenone-ethinyl estradiol (YAZ) 3-0.02 MG per tablet Take 1 tablet by mouth daily.  30 tablet  11  . Bee Pollen 500 MG TABS Take 500 mg by mouth daily. Takes 2 250mg  tablets daily       . cetirizine (ZYRTEC) 10 MG tablet Take 10 mg by mouth daily as needed. For allergies      . fluticasone (FLONASE) 50 MCG/ACT nasal spray Place 2 sprays into the nose daily as needed. For congestion with allergies. Use 2  sprays in each nostril when needed.       Review of Systems All otherwise neg per pt     Objective:   Physical Exam BP 100/70  Pulse 103  Temp(Src) 98 F (36.7 C) (Oral)  Ht 5\' 4"  (1.626 m)  Wt 187 lb 2 oz (84.879 kg)  BMI 32.12 kg/m2  SpO2 98% Physical Exam  VS noted, mild ill Constitutional: Pt appears well-developed and well-nourished.  HENT: Head: Normocephalic.  Right Ear: External ear normal.  Left Ear: External ear normal.  Bilat tm's mild erythema.  Sinus nontender.  Pharynx mild erythema Eyes: Conjunctivae and EOM are normal. Pupils are equal, round, and reactive to light.  Neck: Normal range of motion. Neck supple.  Cardiovascular: Normal rate and regular rhythm.   Pulmonary/Chest: Effort normal and breath sounds mild decr with mild wheeze bialt.  LE; no edema     Assessment & Plan:

## 2011-07-16 NOTE — Telephone Encounter (Signed)
A user error has taken place: encounter opened in error, closed for administrative reasons.

## 2011-08-05 ENCOUNTER — Other Ambulatory Visit: Payer: Self-pay | Admitting: Internal Medicine

## 2011-08-06 NOTE — Telephone Encounter (Signed)
Done

## 2011-08-09 ENCOUNTER — Ambulatory Visit: Payer: 59 | Admitting: Internal Medicine

## 2011-08-22 ENCOUNTER — Encounter: Payer: Self-pay | Admitting: Internal Medicine

## 2011-08-22 ENCOUNTER — Ambulatory Visit (INDEPENDENT_AMBULATORY_CARE_PROVIDER_SITE_OTHER): Payer: 59 | Admitting: Internal Medicine

## 2011-08-22 VITALS — BP 132/88 | HR 83 | Temp 97.8°F | Ht 66.0 in | Wt 186.1 lb

## 2011-08-22 DIAGNOSIS — G8929 Other chronic pain: Secondary | ICD-10-CM

## 2011-08-22 DIAGNOSIS — F419 Anxiety disorder, unspecified: Secondary | ICD-10-CM

## 2011-08-22 DIAGNOSIS — K589 Irritable bowel syndrome without diarrhea: Secondary | ICD-10-CM

## 2011-08-22 DIAGNOSIS — N760 Acute vaginitis: Secondary | ICD-10-CM

## 2011-08-22 DIAGNOSIS — R1031 Right lower quadrant pain: Secondary | ICD-10-CM

## 2011-08-22 DIAGNOSIS — F411 Generalized anxiety disorder: Secondary | ICD-10-CM

## 2011-08-22 MED ORDER — FLUCONAZOLE 150 MG PO TABS: 150.0000 mg | ORAL_TABLET | Freq: Once | ORAL | Status: AC

## 2011-08-22 NOTE — Patient Instructions (Addendum)
It was good to see you today. We have reviewed your prior records including labs and tests today Use Diflucan for yeast and vaginitis itch symptoms - Your prescription(s) have been submitted to your pharmacy. Please take as directed and contact our office if you believe you are having problem(s) with the medication(s). we'll make referral for CT scan of abdomen and pelvis . Our office will contact you regarding appointment(s) once made. Your symptoms may be related to irritable bowel syndrome - discussed same possibility with Dr. Alvera Novel at next visit and options for medical treatment Please schedule followup in 3-4 weeks with [facility], call sooner if problems. Irritable Bowel Syndrome Irritable Bowel Syndrome (IBS) is caused by a disturbance of normal bowel function. Other terms used are spastic colon, mucous colitis, and irritable colon. It does not require surgery, nor does it lead to cancer. There is no cure for IBS. But with proper diet, stress reduction, and medication, you will find that your problems (symptoms) will gradually disappear or improve. IBS is a common digestive disorder. It usually appears in late adolescence or early adulthood. Women develop it twice as often as men. CAUSES   After food has been digested and absorbed in the small intestine, waste material is moved into the colon (large intestine). In the colon, water and salts are absorbed from the undigested products coming from the small intestine. The remaining residue, or fecal material, is held for elimination. Under normal circumstances, gentle, rhythmic contractions on the bowel walls push the fecal material along the colon towards the rectum. In IBS, however, these contractions are irregular and poorly coordinated. The fecal material is either retained too long, resulting in constipation, or expelled too soon, producing diarrhea. SYMPTOMS   The most common symptom of IBS is pain. It is typically in the lower left side of the belly  (abdomen). But it may occur anywhere in the abdomen. It can be felt as heartburn, backache, or even as a dull pain in the arms or shoulders. The pain comes from excessive bowel-muscle spasms and from the buildup of gas and fecal material in the colon. This pain:  Can range from sharp belly (abdominal) cramps to a dull, continuous ache.   Usually worsens soon after eating.   Is typically relieved by having a bowel movement or passing gas.  Abdominal pain is usually accompanied by constipation. But it may also produce diarrhea. The diarrhea typically occurs right after a meal or upon arising in the morning. The stools are typically soft and watery. They are often flecked with secretions (mucus). Other symptoms of IBS include:  Bloating.   Loss of appetite.   Heartburn.   Feeling sick to your stomach (nausea).   Belching   Vomiting   Gas.  IBS may also cause a number of symptoms that are unrelated to the digestive system:  Fatigue.   Headaches.   Anxiety   Shortness of breath   Difficulty in concentrating.   Dizziness.  These symptoms tend to come and go. DIAGNOSIS   The symptoms of IBS closely mimic the symptoms of other, more serious digestive disorders. So your caregiver may wish to perform a variety of additional tests to exclude these disorders. He/she wants to be certain of learning what is wrong (diagnosis). The nature and purpose of each test will be explained to you. TREATMENT A number of medications are available to help correct bowel function and/or relieve bowel spasms and abdominal pain. Among the drugs available are:  Mild, non-irritating laxatives  for severe constipation and to help restore normal bowel habits.   Specific anti-diarrheal medications to treat severe or prolonged diarrhea.   Anti-spasmodic agents to relieve intestinal cramps.   Your caregiver may also decide to treat you with a mild tranquilizer or sedative during unusually stressful periods  in your life.  The important thing to remember is that if any drug is prescribed for you, make sure that you take it exactly as directed. Make sure that your caregiver knows how well it worked for you. HOME CARE INSTRUCTIONS    Avoid foods that are high in fat or oils. Some examples ZOX:[id number] cream, butter, frankfurters, sausage, and other fatty meats.   Avoid foods that have a laxative effect, such as fruit, fruit juice, and dairy products.   Cut out carbonated drinks, chewing gum, and "gassy" foods, such as beans and cabbage. This may help relieve bloating and belching.   Bran taken with plenty of liquids may help relieve constipation.   Keep track of what foods seem to trigger your symptoms.   Avoid emotionally charged situations or circumstances that produce anxiety.   Start or continue exercising.   Get plenty of rest and sleep.  MAKE SURE YOU:    Understand these instructions.   Will watch your condition.   Will get help right away if you are not doing well or get worse.  Document Released: 05/07/2005 Document Revised: 04/26/2011 Document Reviewed: 12/26/2007 [facility] Patient Information 2012 [place], [place].

## 2011-08-22 NOTE — Progress Notes (Signed)
Subjective:    Patient ID: Susan Roth, female    DOB: 1975/05/28, 36 y.o.   MRN: [id number]  HPI  Complains of right lower abdominal pain Intermittent chronic symptoms over past 2 years, increasing frequency Last episode December 2012 requiring emergency room evaluation> labs and ultrasound unremarkable Also reports unremarkable GYN exam Current symptoms of vaginal itching and irritation consistent with yeast but denies dysuria, abnormal bleeding, pelvic pain Denies unintentional change in weight, no bowel changes such as diarrhea. No nausea or vomiting  Also has other vague complaints of sensation of right arm swelling, occasional left rib pain and sensitivity of her feet to touch  Denies active depression or anxiety symptoms, previously on Wellbutrin for same but poorly tolerated due to rash  Past Medical History  Diagnosis Date  . Allergic rhinitis   . Herpes zoster without mention of complication   . Dysfunction of eustachian tube   . Kidney calculi    Review of Systems  Constitutional: Positive for fatigue. Negative for fever, activity change, appetite change and unexpected weight change.  HENT: Negative for neck pain.   Respiratory: Negative for shortness of breath and wheezing.   Cardiovascular: Negative for chest pain and palpitations.  Genitourinary: Negative for dysuria, vaginal discharge, vaginal pain, menstrual problem, pelvic pain and dyspareunia.  Neurological: Negative for dizziness, weakness, light-headedness and headaches.       Objective:   Physical Exam BP 132/88  Pulse 83  Temp(Src) 97.8 F (36.6 C) (Oral)  Ht 5\' 6"  (1.676 m)  Wt 186 lb 1.9 oz (84.423 kg)  BMI 30.04 kg/m2  SpO2 98% Wt Readings from Last 3 Encounters:  08/22/11 186 lb 1.9 oz (84.423 kg)  07/16/11 187 lb 2 oz (84.879 kg)  05/01/11 174 lb 8 oz (79.153 kg)   Constitutional: She appears well-developed and well-nourished. No distress.  Neck: Normal range of motion. Neck supple. No  JVD present. No thyromegaly present.  Cardiovascular: Normal rate, regular rhythm and normal heart sounds.  No murmur heard. No BLE edema. Pulmonary/Chest: Effort normal and breath sounds normal. No respiratory distress. She has no wheezes.  Abdominal: Soft. Bowel sounds are normal. She exhibits no distension. There is no tenderness. no masses GU: defer to gyn Psychiatric: She has an anxious mood and affect. Her behavior is normal. Judgment and thought content normal.   Lab Results  Component Value Date   WBC 6.0 04/28/2011   HGB 13.3 04/28/2011   HCT 39.0 04/28/2011   PLT 284 04/28/2011   GLUCOSE 90 04/28/2011   CHOL 168 08/23/2010   TRIG 51.0 08/23/2010   HDL 59.40 08/23/2010   LDLCALC 98 08/23/2010   ALT 9 04/28/2011   AST 13 04/28/2011   NA 137 04/28/2011   K 3.9 04/28/2011   CL 104 04/28/2011   CREATININE 0.78 04/28/2011   BUN 9 04/28/2011   CO2 24 04/28/2011   TSH 0.92 08/23/2010   US Abdomen Complete  04/28/2011  *RADIOLOGY REPORT*  Clinical Data:  Right upper quadrant pain  COMPLETE ABDOMINAL ULTRASOUND  Comparison:  12/05/2009  Findings:  Gallbladder:  No gallstones, gallbladder wall thickening, or pericholecystic fluid.  Common bile duct:  Normal caliber, measuring about 4 mm diameter.  Liver:  No focal lesion identified.  Within normal limits in parenchymal echogenicity.Color flow Doppler images of the main portal and hepatic veins show flow in the appropriate directions.  IVC:  Appears normal.  Pancreas:  No focal abnormality seen.  Spleen:  Spleen length measures about 3.3 cm.  Normal focal parenchymal echotexture.  Right Kidney:  Right kidney measures 10.3 cm length.  No hydronephrosis.  Left Kidney:  I kidney measures 9.8 cm length.  No hydronephrosis.  Abdominal aorta:  No aneurysm identified.  IMPRESSION: Negative study.  No evidence of cholecystitis or cholelithiasis.  Original Report Authenticated By: Marlon Pel, M.D.      Assessment & Plan:   Right lower quad pain,  intermittent. Reviewed December 2012 labs and ultrasound, unremarkable. Suspect component of IBS, but no history of bowel changes. Will check CT abdomen pelvis for patient reassurance. Provided information about irritable bowel to patient and advised followup with PCP on same  Vaginitis, history of yeast. Treat with Diflucan - erx done  Anxiety/depression. Clinically evident on exam. Suggested possible SSRI therapy for same to help overlapping IBS symptoms - patient appears reluctant to same, but will followup with primary care on same

## 2011-08-26 ENCOUNTER — Ambulatory Visit (INDEPENDENT_AMBULATORY_CARE_PROVIDER_SITE_OTHER): Payer: 59 | Admitting: Family Medicine

## 2011-08-26 VITALS — BP 108/74 | HR 78 | Temp 97.5°F | Resp 16 | Ht 64.5 in | Wt 182.8 lb

## 2011-08-26 DIAGNOSIS — N760 Acute vaginitis: Secondary | ICD-10-CM

## 2011-08-26 DIAGNOSIS — M545 Low back pain: Secondary | ICD-10-CM

## 2011-08-26 DIAGNOSIS — Z87440 Personal history of urinary (tract) infections: Secondary | ICD-10-CM

## 2011-08-26 LAB — POCT URINALYSIS DIPSTICK
Bilirubin, UA: NEGATIVE
Ketones, UA: NEGATIVE
pH, UA: 7

## 2011-08-26 LAB — POCT WET PREP WITH KOH
Clue Cells Wet Prep HPF POC: NEGATIVE
Trichomonas, UA: NEGATIVE
Yeast Wet Prep HPF POC: NEGATIVE

## 2011-08-26 LAB — POCT UA - MICROSCOPIC ONLY: Mucus, UA: NEGATIVE

## 2011-08-26 MED ORDER — NITROFURANTOIN MONOHYD MACRO 100 MG PO CAPS: 100.0000 mg | ORAL_CAPSULE | Freq: Two times a day (BID) | ORAL | Status: AC

## 2011-08-26 NOTE — Progress Notes (Signed)
Subjective:    Patient ID: Susan Roth, female    DOB: [birth year], 36 y.o.   MRN: [id number]  HPI 36 yo female seen 08/22/11 at PCP office with multiple complaints here today with similar.  That OV reviewed - given Rx of diflucan for vaginitis though no wet prep done.  Also ordered for CT Abd/pelvis, though has not yet been performed.  Is scheduled for tomorrow.  Today complaints of: 1) Vaginal discomfort - vague itching sensation.  No discharge.  No change with diflucan 2) Right lower back pain for about a week.  Denies dysuria, frequency, hesitancy.  History of UTI's though usuall associated with pain    Review of Systems Negative except as per HPI     Objective:   Physical Exam  Constitutional: Vital signs are normal. She appears well-developed and well-nourished. She is active.  Cardiovascular: Normal rate, regular rhythm, normal heart sounds and normal pulses.   Pulmonary/Chest: Effort normal and breath sounds normal.  Abdominal: Soft. Normal appearance and bowel sounds are normal. She exhibits no distension and no mass. There is no hepatosplenomegaly. There is no tenderness. There is no rigidity, no rebound, no guarding, no CVA tenderness, no tenderness at McBurney's point and negative Murphy's sign. No hernia.  Neurological: She is alert.    Scant white discharge in vaginal vault.  Introitus, vagina, cervix normal-appearing.   Results for orders placed in visit on 08/26/11  POCT URINALYSIS DIPSTICK      Component Value Range   Color, UA yellow     Clarity, UA cloudy     Glucose, UA neg     Bilirubin, UA neg     Ketones, UA neg     Spec Grav, UA 1.025     Blood, UA trace     pH, UA 7.0     Protein, UA neg     Urobilinogen, UA 0.2     Nitrite, UA neg     Leukocytes, UA large (3+)    POCT UA - MICROSCOPIC ONLY      Component Value Range   WBC, Ur, HPF, POC 10-20     RBC, urine, microscopic 2-4     Bacteria, U Microscopic 2+     Mucus, UA neg     Epithelial cells,  urine per micros 2-5     Crystals, Ur, HPF, POC neg     Casts, Ur, LPF, POC neg     Yeast, UA neg     Results for orders placed in visit on 08/26/11  POCT URINALYSIS DIPSTICK      Component Value Range   Color, UA yellow     Clarity, UA cloudy     Glucose, UA neg     Bilirubin, UA neg     Ketones, UA neg     Spec Grav, UA 1.025     Blood, UA trace     pH, UA 7.0     Protein, UA neg     Urobilinogen, UA 0.2     Nitrite, UA neg     Leukocytes, UA large (3+)    POCT UA - MICROSCOPIC ONLY      Component Value Range   WBC, Ur, HPF, POC 10-20     RBC, urine, microscopic 2-4     Bacteria, U Microscopic 2+     Mucus, UA neg     Epithelial cells, urine per micros 2-5     Crystals, Ur, HPF, POC neg     Casts,  Ur, LPF, POC neg     Yeast, UA neg    POCT WET PREP WITH KOH      Component Value Range   Trichomonas, UA Negative     Clue Cells Wet Prep HPF POC neg     Epithelial Wet Prep HPF POC 0-4     Yeast Wet Prep HPF POC neg     Bacteria Wet Prep HPF POC 1+     RBC Wet Prep HPF POC 0-1     WBC Wet Prep HPF POC 8-10     KOH Prep POC Negative         Assessment & Plan:  Wet prep negative.  UA suspicious for UTI.  Start treatment now while awaiting culture.  Macrobid BID for 5 days.

## 2011-08-27 ENCOUNTER — Emergency Department (HOSPITAL_COMMUNITY)
Admission: EM | Admit: 2011-08-27 | Discharge: 2011-08-27 | Disposition: A | Discharge status: Home / Self Care | Payer: 59 | Attending: Emergency Medicine | Admitting: Emergency Medicine

## 2011-08-27 ENCOUNTER — Ambulatory Visit (INDEPENDENT_AMBULATORY_CARE_PROVIDER_SITE_OTHER)
Admission: RE | Admit: 2011-08-27 | Discharge: 2011-08-27 | Disposition: A | Discharge status: Home / Self Care | Payer: 59 | Source: Ambulatory Visit | Attending: Internal Medicine | Admitting: Internal Medicine

## 2011-08-27 ENCOUNTER — Encounter (HOSPITAL_COMMUNITY): Payer: Self-pay | Admitting: *Deleted

## 2011-08-27 DIAGNOSIS — G8929 Other chronic pain: Secondary | ICD-10-CM

## 2011-08-27 DIAGNOSIS — K589 Irritable bowel syndrome without diarrhea: Secondary | ICD-10-CM

## 2011-08-27 DIAGNOSIS — A599 Trichomoniasis, unspecified: Secondary | ICD-10-CM

## 2011-08-27 DIAGNOSIS — R1031 Right lower quadrant pain: Secondary | ICD-10-CM

## 2011-08-27 LAB — URINALYSIS, ROUTINE W REFLEX MICROSCOPIC
Bilirubin Urine: NEGATIVE
Nitrite: NEGATIVE
Specific Gravity, Urine: 1.017 (ref 1.005–1.030)
Urobilinogen, UA: 0.2 mg/dL (ref 0.0–1.0)

## 2011-08-27 LAB — URINE MICROSCOPIC-ADD ON

## 2011-08-27 MED ORDER — IOHEXOL 300 MG/ML  SOLN
100.0000 mL | Freq: Once | INTRAMUSCULAR | Status: AC | PRN
  Administered 2011-08-27: 100 mL via INTRAVENOUS

## 2011-08-27 MED ORDER — METRONIDAZOLE 500 MG PO TABS
2000.0000 mg | ORAL_TABLET | Freq: Once | ORAL | Status: AC
  Administered 2011-08-27: 2000 mg via ORAL
  Filled 2011-08-27: qty 4

## 2011-08-27 NOTE — ED Provider Notes (Signed)
History     CSN: [id number]  Arrival date & time 08/27/11  [id number]   First MD Initiated Contact with Patient 08/27/11 0424      Chief Complaint  Patient presents with  . Hematuria    (Consider location/radiation/quality/duration/timing/severity/associated sxs/prior treatment) Patient is a 36 y.o. female presenting with hematuria. The history is provided by the patient.  Hematuria This is a new problem. The current episode started today. The problem has been resolved since onset. She describes the hematuria as gross hematuria. The hematuria occurs during the initial portion of her urinary stream. She reports no clotting in her urine stream. She is experiencing no pain. She describes her urine color as dark red. Irritative symptoms include frequency. Associated symptoms include dysuria. Pertinent negatives include no abdominal pain, fever, flank pain, inability to urinate or nausea. She is sexually active. Her past medical history is significant for recent infection.   Pt presents after hematuria episode x1. Was seen at urgent care yesterday for dysuria and txed for UTI, started on Macrobid. MD at UC did do pelvic exam which was unremarkable per pt. She states she got up to use the bathroom this evening and noted gross hematuria, described as several drops of blood on tissue. She is about 2 weeks out from her menses and does not normally have bleeding between periods. Denies any urethral trauma, flank pain, abd pain.  Past Medical History  Diagnosis Date  . Allergic rhinitis   . Herpes zoster without mention of complication   . Dysfunction of eustachian tube   . Kidney calculi     Past Surgical History  Procedure Date  . Wisdom tooth extraction   . Cesarean section     x1  . Tonsillectomy     as an adult - 2011    Family History  Problem Relation Age of Onset  . Hyperlipidemia Mother   . Hypertension Mother   . Nephrolithiasis Mother   . Crohn's disease Mother   .  Nephrolithiasis Father   . Depression Father     bipolar  . Asthma Other   . Allergies Other   . Allergies Other   . Clotting disorder Other   . Cancer Other     Cervical  . Arthritis Other   . Cancer Other     Colon  . Diabetes Other     History  Substance Use Topics  . Smoking status: Never Smoker   . Smokeless tobacco: Not on file   Comment: positive hx of passive tobacco smoke exposure  . Alcohol Use: 0.6 oz/week    1 Glasses of wine per week    OB History    Grav Para Term Preterm Abortions TAB SAB Ect Mult Living   1 1 0 1 0 0 0 0 0 1       Review of Systems  Constitutional: Negative for fever.  Gastrointestinal: Negative for nausea and abdominal pain.  Genitourinary: Positive for dysuria, frequency and hematuria. Negative for flank pain.  Musculoskeletal: Negative for back pain.    Allergies  Amoxicillin; Penicillins; and Wellbutrin  Home Medications   Current Outpatient Rx  Name Route Sig Dispense Refill  . BEE POLLEN 500 MG PO TABS Oral Take 500 mg by mouth daily. Takes 2 250mg  tablets daily    . DOXYCYCLINE HYCLATE 100 MG PO TABS Oral Take 100 mg by mouth daily.    Marland Kitchen GIANVI 3-0.02 MG PO TABS  TAKE 1 TABLET BY MOUTH DAILY. 30 tablet 5  .  NITROFURANTOIN MONOHYD MACRO 100 MG PO CAPS Oral Take 1 capsule (100 mg total) by mouth 2 (two) times daily. 10 capsule 0    BP 119/83  Pulse 97  Temp(Src) 98.4 F (36.9 C) (Oral)  Resp 16  SpO2 100%  LMP 08/11/2011  Physical Exam  Nursing note and vitals reviewed. Constitutional: She appears well-developed and well-nourished. No distress.  HENT:  Head: Normocephalic and atraumatic.  Eyes: Pupils are equal, round, and reactive to light.  Neck: Normal range of motion.  Cardiovascular: Normal rate, regular rhythm and normal heart sounds.   Pulmonary/Chest: Effort normal and breath sounds normal.  Abdominal: Soft. Bowel sounds are normal. There is no tenderness. There is no rebound and no guarding.       Very  mild suprapubic tenderness  Genitourinary:       No evidence for urethral trauma or current urethral bleeding although area surrounding urethra is ttp. No blood in vaginal vault, os closed.  Musculoskeletal: Normal range of motion.  Neurological: She is alert.  Skin: Skin is warm and dry. She is not diaphoretic.  Psychiatric: She has a normal mood and affect.    ED Course  Procedures (including critical care time)  Labs Reviewed  URINALYSIS, ROUTINE W REFLEX MICROSCOPIC - Abnormal; Notable for the following:    APPearance CLOUDY (*)    Hgb urine dipstick TRACE (*)    Leukocytes, UA LARGE (*)    All other components within normal limits  URINE MICROSCOPIC-ADD ON - Abnormal; Notable for the following:    Squamous Epithelial / LPF FEW (*)    Bacteria, UA FEW (*)    All other components within normal limits   No results found.   1. Trichomonas       MDM  Pt has trace hgb here with none seen on micro, but trich seen. Will tx here for trich. Advised that she needs to have her husband treated and no intercourse until they have both been treated. On exam, no obvious trauma, vaginal bleeding. Pt instructed to finish abx as prescribed at UC. Return precautions discussed.  She is also apparently scheduled for an outpatient CT today, scheduled by her PCP to work up some chronic abd pain she's been having for past 2 years; she was instructed to keep appt for this.        Grant Fontana, [place] 08/29/11 [phone]

## 2011-08-27 NOTE — Discharge Instructions (Signed)
Your urine showed trichomonas. You have been treated in the ER for this today. The medicine can cause you to have a flushing sensation if you drink alcohol with it, so avoid alcohol use for the next 24 hours. Please let your husband know that he needs to be treated as well. Your episode of hematuria (blood in the urine) may be coming from the UTI. Take all of the antibiotic until it is gone. Plan to proceed with your CT scan today as scheduled. Return to the ER for worsening condition.  RESOURCE GUIDE  Dental Problems  Patients with Medicaid: [facility] [phone] [address]                                           [phone] [address] Phone:  [phone]                                                  Phone:  [phone]  If unable to pay or uninsured, contact:  [facility] or [facility]. to become qualified for the adult dental clinic.  Chronic Pain Problems Contact [facility]  [phone] Patients need to be referred by their primary care doctor.  Insufficient Money for Medicine Contact [employer]:  call "211" or [facility] (579)127-6778.  No Primary Care Doctor Call [facility]  704-520-5088 Other agencies that provide inexpensive medical care    [facility]  [phone]    [facility]  [phone]    [facility]  ([phone]    [facility]  [phone]    [facility]  [phone]    [facility]  [phone]  Psychological Services [facility] Behavior[facility]  [phone] [facility]  [phone] [facility]   [phone] (emergency services 623-039-7815)  Substance Abuse Resources [facility]  919-158-0688 [facility] [phone] [place] [phone] Floydene Flock [phone] [facility]idential [facility] Out[facility] Substance Abuse Program  628-694-5619  Abuse/Neglect [facility] Child Abuse Hotline  (867)109-5513 [facility] Child Abuse Hotline 204-496-1899 (After Hours)  Emergency Shelter [facility] (407)327-5919  Maternity Homes [facility] [place] [facility] ([phone] [facility] [phone]  MRSA Hotline #:   203-643-3039    [facility] Resources  [facility]     [employer]                          [facility] Dept. [address] [place]                       [address]      [address]  Patrecia Pace  Michell Heinrich Phone:  [phone]                                   Phone:  [phone]                 Phone:  [phone]  [facility] Mental [facility] Phone:  ([phone]  [facility] Child Abuse Hotline (951) 079-8509 860-249-6740 (After Hours)  Trichomoniasis Trichomoniasis is an infection, caused by the Trichomonas organism, that affects both women and men. In women, the outer female genitalia and the vagina are affected. In men, the penis is mainly affected, but the prostate and other reproductive organs can also be involved. Trichomoniasis is a sexually transmitted disease (STD) and is most often passed to another person through sexual contact. The majority of people who get trichomoniasis do so from a sexual encounter and are also at risk for other STDs. CAUSES   Sexual intercourse with an infected partner.   It can be present in swimming pools or hot tubs.  SYMPTOMS   Abnormal gray-green frothy vaginal discharge in women.   Vaginal itching and irritation in women.   Itching and irritation of the area outside the vagina in women.   Penile discharge with or without pain in males.   Inflammation of the urethra (urethritis), causing painful urination.   Bleeding after sexual intercourse.  RELATED COMPLICATIONS  Pelvic inflammatory disease.   Infection of the uterus (endometritis).     Infertility.   Tubal (ectopic) pregnancy.   It can be associated with other STDs, including gonorrhea and chlamydia, hepatitis B, and HIV.  COMPLICATIONS DURING PREGNANCY  Early (premature) delivery.   Premature rupture of the membranes (PROM).   Low birth weight.  DIAGNOSIS   Visualization of Trichomonas under the microscope from the vagina discharge.   Ph of the vagina greater than 4.5, tested with a test tape.   Trich Rapid Test.   Culture of the organism, but this is not usually needed.   It may be found on a Pap test.   Having a "strawberry cervix,"which means the cervix looks very red like a strawberry.  TREATMENT   You may be given medication to fight the infection. Inform your caregiver if you could be or are pregnant. Some medications used to treat the infection should not be taken during pregnancy.   Over-the-counter medications or creams to decrease itching or irritation may be recommended.   Your sexual partner will need to be treated if infected.  HOME CARE INSTRUCTIONS   Take all medication prescribed by your caregiver.   Take over-the-counter medication for itching or irritation as directed by your caregiver.   Do not have sexual intercourse while you have the infection.   Do not douche or wear tampons.   Discuss your infection with your partner, as your partner may have acquired the infection from you. Or, your partner may have been the person who transmitted the infection to you.   Have your sex partner examined and treated if necessary.   Practice safe, informed, and protected sex.   See your caregiver for other STD testing.  SEEK MEDICAL CARE IF:   You still have symptoms after you finish the medication.   You have an oral temperature above 102 F (38.9 C).   You develop belly (abdominal) pain.   You have pain when you urinate.   You have bleeding after sexual intercourse.   You develop  a rash.   The medication makes you sick or  makes you throw up (vomit).  Document Released: 10/31/2000 Document Revised: 04/26/2011 Document Reviewed: 11/26/2008 [facility] Patient Information 2012 [place], [place].

## 2011-08-27 NOTE — ED Notes (Signed)
Pt notes hematuria x 1 hr after urinating. Pt is presently taking abx for a UTI.

## 2011-08-27 NOTE — ED Notes (Signed)
Patient given discharge instructions, information, prescriptions, and diet order. Patient states that they adequately understand discharge information given and to return to ED if symptoms return or worsen.     

## 2011-08-30 NOTE — ED Provider Notes (Signed)
Medical screening examination/treatment/procedure(s) were performed by non-physician practitioner and as supervising physician I was immediately available for consultation/collaboration.   Vida Roller, MD 08/30/11 [phone]

## 2011-09-19 ENCOUNTER — Other Ambulatory Visit: Payer: Self-pay | Admitting: Internal Medicine

## 2011-09-19 DIAGNOSIS — E049 Nontoxic goiter, unspecified: Secondary | ICD-10-CM

## 2011-09-25 ENCOUNTER — Ambulatory Visit (HOSPITAL_COMMUNITY)
Admission: RE | Admit: 2011-09-25 | Discharge: 2011-09-25 | Disposition: A | Discharge status: Home / Self Care | Payer: 59 | Source: Ambulatory Visit | Attending: Internal Medicine | Admitting: Internal Medicine

## 2011-09-25 DIAGNOSIS — R1310 Dysphagia, unspecified: Secondary | ICD-10-CM | POA: Insufficient documentation

## 2011-09-25 DIAGNOSIS — E049 Nontoxic goiter, unspecified: Secondary | ICD-10-CM | POA: Insufficient documentation

## 2011-09-27 ENCOUNTER — Ambulatory Visit: Payer: 59 | Admitting: Internal Medicine

## 2011-09-27 DIAGNOSIS — Z0289 Encounter for other administrative examinations: Secondary | ICD-10-CM

## 2011-09-30 ENCOUNTER — Ambulatory Visit: Payer: 59 | Admitting: Family Medicine

## 2011-09-30 VITALS — BP 114/78 | HR 76 | Temp 97.5°F | Resp 16 | Ht 65.0 in | Wt 189.0 lb

## 2011-09-30 DIAGNOSIS — M75100 Unspecified rotator cuff tear or rupture of unspecified shoulder, not specified as traumatic: Secondary | ICD-10-CM

## 2011-09-30 DIAGNOSIS — M778 Other enthesopathies, not elsewhere classified: Secondary | ICD-10-CM

## 2011-09-30 MED ORDER — METHYLPREDNISOLONE 4 MG PO KIT: PACK | ORAL | Status: AC

## 2011-09-30 NOTE — Patient Instructions (Signed)
Bicipital Tendonitis  Bicipital tendonitis refers to redness, soreness, and swelling (inflammation) or irritation of the bicep tendon. The biceps muscle is located between the elbow and shoulder of the inner arm. The tendon heads, similar to pieces of rope, connect the bicep muscle to the shoulder socket. They are called short head and long head tendons. When tendonitis occurs, the long head tendon is inflamed and swollen, and may be thickened or partially torn.    Bicipital tendonitis can occur with other problems as well, such as arthritis in the shoulder or acromioclavicular joints, tears in the tendons, or other rotator cuff problems.    CAUSES    Overuse of of the arms for overhead activities is the major cause of tendonitis. Many athletes, such as swimmers, baseball players, and tennis players are prone to bicipital tendonitis. Jobs that require manual labor or routine chores, especially chores involving overhead activities can result in overuse and tendonitis.  SYMPTOMS  Symptoms may include:   Pain in and around the front of the shoulder. Pain may be worse with overhead motion.    Pain or aching that radiates down the arm.    Clicking or shifting sensations in the shoulder.   DIAGNOSIS  Your caregiver may perform the following:   Physical exam and tests of the biceps and shoulder to observe range of motion, strength, and stability.    X-rays or magnetic resonance imaging (MRI) to confirm the diagnosis. In most common cases, these tests are not necessary.   Since other problems may exist in the shoulder or rotator cuff, additional tests may be recommended.  TREATMENT  Treatment may include the following:   Medications    Your caregiver may prescribe over-the-counter pain relievers.    Steroid injections, such as cortisone, may be recommended. These may help to reduce inflammation and pain.    Physical Therapy - Your caregiver may recommend gentle exercises with the arm. These can help restore  strength and range of motion. They may be done at home or with a physical therapist's supervision and input.    Surgery - Arthroscopic or open surgery sometimes is necessary. Surgery may include:    Reattachment or repair of the tendon at the shoulder socket.    Removal of the damaged section of the tendon.    Anchoring the tendon to a different area of the shoulder (tenodesis).   HOME CARE INSTRUCTIONS     Avoid overhead motion of the affected arm or any other motion that causes pain.    Take medication for pain as directed. Do not take these for more than 3 weeks, unless directed to do so by your caregiver.    Ice the affected area for 20 minutes at a time, 3-4 times per day. Place a towel on the skin over the painful area and the ice or cold pack over the towel. Do not place ice directly on the skin.    Perform gentle exercises at home as directed. These will increase strength and flexibility.   PREVENTION   Modify your activities as much as possible to protect your arm. A physical therapist or sports medicine physician can help you understand options for safe motion.    Avoid repetitive overhead pulling, lifting, reaching, and throwing until your caregiver tells you it is ok to resume these activities.   SEEK MEDICAL CARE IF:   Your pain worsens.    You have difficulty moving the affected arm.    You have trouble performing any of the   self-care instructions.   MAKE SURE YOU:     Understand these instructions.    Will watch your condition.    Will get help right away if you are not doing well or get worse.   Document Released: 06/09/2010 Document Revised: 04/26/2011 Document Reviewed: 06/09/2010  [employer] Patient Information 2012 [employer].

## 2011-09-30 NOTE — Progress Notes (Signed)
36 yo woman at [facility] lab with 2 months of pain radiating from right neck down arm to dorsal hand.  Patient works in data entry.  [employer].  O:  NAD Tender right anterior shoulder joint line.  Pain reproduced with abduction and reaching behind. Full neck ROM Nontender neck Neg finkelstein's, tinel's FROM of elbow and wrist  A:  Shoulder tendonititis.  P:  Medrol dospak.

## 2011-11-20 ENCOUNTER — Other Ambulatory Visit: Payer: Self-pay | Admitting: Obstetrics and Gynecology

## 2011-11-20 DIAGNOSIS — N63 Unspecified lump in unspecified breast: Secondary | ICD-10-CM

## 2011-11-28 ENCOUNTER — Other Ambulatory Visit: Payer: Self-pay | Admitting: Obstetrics and Gynecology

## 2011-11-28 ENCOUNTER — Ambulatory Visit
Admission: RE | Admit: 2011-11-28 | Discharge: 2011-11-28 | Disposition: A | Discharge status: Home / Self Care | Payer: 59 | Source: Ambulatory Visit | Attending: Obstetrics and Gynecology | Admitting: Obstetrics and Gynecology

## 2011-11-28 DIAGNOSIS — N63 Unspecified lump in unspecified breast: Secondary | ICD-10-CM

## 2011-12-18 ENCOUNTER — Inpatient Hospital Stay (HOSPITAL_COMMUNITY)
Admission: AD | Admit: 2011-12-18 | Discharge: 2011-12-18 | Disposition: A | Discharge status: Home / Self Care | Payer: 59 | Source: Ambulatory Visit | Attending: Obstetrics & Gynecology | Admitting: Obstetrics & Gynecology

## 2011-12-18 DIAGNOSIS — N949 Unspecified condition associated with female genital organs and menstrual cycle: Secondary | ICD-10-CM | POA: Insufficient documentation

## 2011-12-18 NOTE — MAU Note (Signed)
Patient states she was recently treated for trich. States she started having the same symptoms with a yellow vaginal discharge with an odor. No pain.

## 2012-01-31 ENCOUNTER — Inpatient Hospital Stay (HOSPITAL_COMMUNITY)
Admission: AD | Admit: 2012-01-31 | Discharge: 2012-02-01 | Disposition: A | Discharge status: Home / Self Care | Payer: 59 | Source: Ambulatory Visit | Attending: Obstetrics & Gynecology | Admitting: Obstetrics & Gynecology

## 2012-01-31 ENCOUNTER — Encounter (HOSPITAL_COMMUNITY): Payer: Self-pay | Admitting: *Deleted

## 2012-01-31 ENCOUNTER — Inpatient Hospital Stay (HOSPITAL_COMMUNITY): Payer: 59

## 2012-01-31 DIAGNOSIS — N7011 Chronic salpingitis: Secondary | ICD-10-CM

## 2012-01-31 DIAGNOSIS — R1084 Generalized abdominal pain: Secondary | ICD-10-CM

## 2012-01-31 DIAGNOSIS — N949 Unspecified condition associated with female genital organs and menstrual cycle: Secondary | ICD-10-CM | POA: Insufficient documentation

## 2012-01-31 DIAGNOSIS — IMO0002 Reserved for concepts with insufficient information to code with codable children: Secondary | ICD-10-CM | POA: Insufficient documentation

## 2012-01-31 DIAGNOSIS — N7013 Chronic salpingitis and oophoritis: Secondary | ICD-10-CM | POA: Insufficient documentation

## 2012-01-31 DIAGNOSIS — D259 Leiomyoma of uterus, unspecified: Secondary | ICD-10-CM

## 2012-01-31 LAB — URINALYSIS, ROUTINE W REFLEX MICROSCOPIC
Glucose, UA: NEGATIVE mg/dL
Hgb urine dipstick: NEGATIVE
Protein, ur: NEGATIVE mg/dL
Specific Gravity, Urine: 1.025 (ref 1.005–1.030)

## 2012-01-31 LAB — WET PREP, GENITAL
Clue Cells Wet Prep HPF POC: NONE SEEN
Yeast Wet Prep HPF POC: NONE SEEN

## 2012-01-31 LAB — POCT PREGNANCY, URINE: Preg Test, Ur: NEGATIVE

## 2012-01-31 MED ORDER — DOXYCYCLINE HYCLATE 100 MG PO CAPS: 100.0000 mg | ORAL_CAPSULE | Freq: Two times a day (BID) | ORAL | Status: AC

## 2012-01-31 MED ORDER — AZITHROMYCIN 250 MG PO TABS
1000.0000 mg | ORAL_TABLET | Freq: Once | ORAL | Status: AC
  Administered 2012-01-31: 1000 mg via ORAL
  Filled 2012-01-31: qty 4

## 2012-01-31 MED ORDER — CEFTRIAXONE SODIUM 250 MG IJ SOLR
250.0000 mg | Freq: Once | INTRAMUSCULAR | Status: AC
  Administered 2012-01-31: 250 mg via INTRAMUSCULAR
  Filled 2012-01-31: qty 250

## 2012-01-31 NOTE — MAU Provider Note (Signed)
History     CSN: [id number]  Arrival date and time: 01/31/12 1945   First Provider Initiated Contact with Patient 01/31/12 2038      Chief Complaint  Patient presents with  . Back Pain  . Leg Pain  . Abdominal Pain  . Vaginal Discharge   HPI Pt is not pregnant and presents with blood in her urine when she wipes 2 days ago- has not seen any today.  She has some lower abdominal discomfort with she voids.  She has had some constipation.  Her last bowel movement was yesterday- she feels like she has the urge to go to have a bowel movement and will have stool on her rectum.  She has a history of kidney stones last year.  She has a history of bladder infections 4 times last year.  She has a history of bacterial vaginal infection. And trichomonas.  She was previous pt of Dr. Rica Records She some thick mucous white-clear vaginal discharge.  She uses condoms or no sex.  She has dyspareunia with penetration and also vaginal pain. Pt has had CT of abdomen but no pelvic ultrasound.  Pt prefers not to return to [facility]  Past Medical History  Diagnosis Date  . Allergic rhinitis   . Herpes zoster without mention of complication   . Dysfunction of eustachian tube   . Kidney calculi     Past Surgical History  Procedure Date  . Wisdom tooth extraction   . Cesarean section     x1  . Tonsillectomy     as an adult - 2011    Family History  Problem Relation Age of Onset  . Hyperlipidemia Mother   . Hypertension Mother   . Nephrolithiasis Mother   . Crohn's disease Mother   . Nephrolithiasis Father   . Depression Father     bipolar  . Asthma Other   . Allergies Other   . Allergies Other   . Clotting disorder Other   . Cancer Other     Cervical  . Arthritis Other   . Cancer Other     Colon  . Diabetes Other     History  Substance Use Topics  . Smoking status: Never Smoker   . Smokeless tobacco: Not on file   Comment: positive hx of passive tobacco smoke exposure  .  Alcohol Use: 0.6 oz/week    1 Glasses of wine per week    Allergies:  Allergies  Allergen Reactions  . Amoxicillin Hives  . Penicillins Hives  . Wellbutrin (Bupropion Hcl) Hives    Prescriptions prior to admission  Medication Sig Dispense Refill  . Bee Pollen 500 MG TABS Take 500 mg by mouth daily. Takes 2 250mg  tablets daily      . doxycycline (VIBRA-TABS) 100 MG tablet Take 100 mg by mouth daily.      Marland Kitchen GIANVI 3-0.02 MG tablet TAKE 1 TABLET BY MOUTH DAILY.  30 tablet  5    ROS Physical Exam   Blood pressure 124/76, pulse 88, temperature 98.2 F (36.8 C), temperature source Oral, resp. rate 18, height 5\' 4"  (1.626 m), weight 89.472 kg (197 lb 4 oz), last menstrual period 01/05/2012, SpO2 100.00%.  Physical Exam  Vitals reviewed. Constitutional: She is oriented to person, place, and time. She appears well-developed and well-nourished.  HENT:  Head: Normocephalic.  Eyes: Pupils are equal, round, and reactive to light.  Neck: Normal range of motion.  Cardiovascular: Normal rate.   Respiratory: Effort normal.  GI: Soft. She exhibits no distension. There is no tenderness. There is no rebound and no guarding.  Genitourinary:       Small amount of white discharge in vault; cervix clean, eversion noted; mildly tender; uterus NSSC right adnexa tender with palpation- no rebound- left adnexa less tender- no rebound  Musculoskeletal: Normal range of motion.  Neurological: She is alert and oriented to person, place, and time.  Skin: Skin is warm and dry.  Psychiatric: She has a normal mood and affect.    MAU Course  Procedures Pelvic ultrasound ordered due to pt's pain and anxiety/concern RADIOLOGY REPORT*  Clinical Data: Vaginal discharge. Chronic abdomen and back pain.  TRANSABDOMINAL AND TRANSVAGINAL ULTRASOUND OF PELVIS  Technique: Both transabdominal and transvaginal ultrasound  examinations of the pelvis were performed. Transabdominal technique  was performed for global  imaging of the pelvis including uterus,  ovaries, adnexal regions, and pelvic cul-de-sac.  It was necessary to proceed with endovaginal exam following the  transabdominal exam to visualize the uterus and ovaries in better  detail.  Comparison: CT dated 08/27/2011.  Findings:  Uterus: Small, rounded mass in the uterine fundus anteriorly on the  right. This measures 1.3 x 1.2 x 1.0 cm in maximum dimensions.  Otherwise, normal appearing uterus.  Endometrium: Normal in thickness and appearance  Right ovary: Normal appearance/no adnexal mass  Left ovary: Normal appearance/no adnexal mass  Other findings: 3.5 x 2.2 x 1.1 cm elongated, somewhat oval shaped  fluid filled structure adjacent to the right ovary. No definite  free peritoneal fluid visible on the images.  IMPRESSION:  1. Probable loculated distal right hydrosalpinx.  2. 1.3 cm fundal uterine fibroid without a submucosal component.  Original Report Authenticated By: Darrol Angel, M.D.  Results for orders placed during the hospital encounter of 01/31/12 (from the past 24 hour(s))  URINALYSIS, ROUTINE W REFLEX MICROSCOPIC     Status: Normal   Collection Time   01/31/12  7:50 PM      Component Value Range   Color, Urine YELLOW  YELLOW   APPearance CLEAR  CLEAR   Specific Gravity, Urine 1.025  1.005 - 1.030   pH 6.0  5.0 - 8.0   Glucose, UA NEGATIVE  NEGATIVE mg/dL   Hgb urine dipstick NEGATIVE  NEGATIVE   Bilirubin Urine NEGATIVE  NEGATIVE   Ketones, ur NEGATIVE  NEGATIVE mg/dL   Protein, ur NEGATIVE  NEGATIVE mg/dL   Urobilinogen, UA 1.0  0.0 - 1.0 mg/dL   Nitrite NEGATIVE  NEGATIVE   Leukocytes, UA NEGATIVE  NEGATIVE  POCT PREGNANCY, URINE     Status: Normal   Collection Time   01/31/12  8:26 PM      Component Value Range   Preg Test, Ur NEGATIVE  NEGATIVE  WET PREP, GENITAL     Status: Abnormal   Collection Time   01/31/12  9:40 PM      Component Value Range   Yeast Wet Prep HPF POC NONE SEEN  NONE SEEN   Trich, Wet  Prep NONE SEEN  NONE SEEN   Clue Cells Wet Prep HPF POC NONE SEEN  NONE SEEN   WBC, Wet Prep HPF POC MODERATE (*) NONE SEEN   Rocephin 250mg  IM and Zithromax 1 gm PO given to pt in MAU and then will give prescription for Doxycycline 100 mg BID for 10 days and have pt f/u in GYN clinic Discussed with Dr. Macon Large Pt prefers not to return to [facility] and would  like to be seen in the GYN clinic  Assessment and Plan  Pelvic pain with dyspareuniz Loculated distal right hydrosalpinx- will treat as PID with Rocephin and Zithromax in MAU and prescription for Doxycycline 100 mg BID for 10 days Uterine fundal fibroid Pt to f/u in GYN clinic- clinic to call pt for appointment ?IBS- recommend Metamucil and Miralax  LINEBERRY,SUSAN 01/31/2012, 8:39 PM

## 2012-01-31 NOTE — MAU Note (Signed)
Patient is in with c/o chronic abdominal pains, cramping, back pain, right knee pain, swelling ankles, burning with urination and odorous vaginal discharge. She states that she have  Been to dr Kirtland Bouchard. [place] 4x without any diagnosis.

## 2012-02-01 NOTE — MAU Provider Note (Signed)
Attestation of Attending Supervision of Advanced Practitioner (CNM/NP): Evaluation and management procedures were performed by the Advanced Practitioner under my supervision and collaboration.  I have reviewed the Advanced Practitioner's note and chart, and I agree with the management and plan.  Evalynn Hankins, MD, FACOG Attending Obstetrician & Gynecologist Faculty Practice, [facility]  

## 2012-02-20 ENCOUNTER — Ambulatory Visit (INDEPENDENT_AMBULATORY_CARE_PROVIDER_SITE_OTHER): Payer: 59 | Admitting: Family Medicine

## 2012-02-20 ENCOUNTER — Encounter: Payer: Self-pay | Admitting: Family Medicine

## 2012-02-20 VITALS — BP 120/67 | HR 74 | Temp 98.4°F | Resp 20 | Ht 63.0 in | Wt 199.5 lb

## 2012-02-20 DIAGNOSIS — N7013 Chronic salpingitis and oophoritis: Secondary | ICD-10-CM

## 2012-02-20 DIAGNOSIS — N7011 Chronic salpingitis: Secondary | ICD-10-CM

## 2012-02-20 NOTE — Patient Instructions (Addendum)
Continue motrin, ibuprofen, tylenol or Aleve for pain as needed.  Constipation, Adult Constipation is when a person has fewer than 3 bowel movements a week; has difficulty having a bowel movement; or has stools that are dry, hard, or larger than normal. As people grow older, constipation is more common. If you try to fix constipation with medicines that make you have a bowel movement (laxatives), the problem may get worse. Long-term laxative use may cause the muscles of the colon to become weak. A low-fiber diet, not taking in enough fluids, and taking certain medicines may make constipation worse. CAUSES   Certain medicines, such as antidepressants, pain medicine, iron supplements, antacids, and water pills.   Certain diseases, such as diabetes, irritable bowel syndrome (IBS), thyroid disease, or depression.   Not drinking enough water.   Not eating enough fiber-rich foods.   Stress or travel.  Lack of physical activity or exercise.  Not going to the restroom when there is the urge to have a bowel movement.  Ignoring the urge to have a bowel movement.  Using laxatives too much. SYMPTOMS   Having fewer than 3 bowel movements a week.   Straining to have a bowel movement.   Having hard, dry, or larger than normal stools.   Feeling full or bloated.   Pain in the lower abdomen.  Not feeling relief after having a bowel movement. DIAGNOSIS  Your caregiver will take a medical history and perform a physical exam. Further testing may be done for severe constipation. Some tests may include:   A barium enema X-ray to examine your rectum, colon, and sometimes, your small intestine.  A sigmoidoscopy to examine your lower colon.  A colonoscopy to examine your entire colon. TREATMENT  Treatment will depend on the severity of your constipation and what is causing it. Some dietary treatments include drinking more fluids and eating more fiber-rich foods. Lifestyle treatments may  include regular exercise. If these diet and lifestyle recommendations do not help, your caregiver may recommend taking over-the-counter laxative medicines to help you have bowel movements. Prescription medicines may be prescribed if over-the-counter medicines do not work.  HOME CARE INSTRUCTIONS   Increase dietary fiber in your diet, such as fruits, vegetables, whole grains, and beans. Limit high-fat and processed sugars in your diet, such as [place] fries, hamburgers, cookies, candies, and soda.   A fiber supplement may be added to your diet if you cannot get enough fiber from foods.   Drink enough fluids to keep your urine clear or pale yellow.   Exercise regularly or as directed by your caregiver.   Go to the restroom when you have the urge to go. Do not hold it.  Only take medicines as directed by your caregiver. Do not take other medicines for constipation without talking to your caregiver first. SEEK IMMEDIATE MEDICAL CARE IF:   You have bright red blood in your stool.   Your constipation lasts for more than 4 days or gets worse.   You have abdominal or rectal pain.   You have thin, pencil-like stools.  You have unexplained weight loss. MAKE SURE YOU:   Understand these instructions.  Will watch your condition.  Will get help right away if you are not doing well or get worse. Document Released: 02/03/2004 Document Revised: 07/30/2011 Document Reviewed: 04/10/2011 [facility] Patient Information 2013 [place], [place].

## 2012-02-20 NOTE — Progress Notes (Signed)
  Subjective:    Patient ID: Susan Roth, female    DOB: [birth year], 36 y.o.   MRN: [id number]  HPI  Pt here for f/u of R hydrosalpinx. Seen in MAU 01/31/12 for Right pelvic/abdominal pain. Transvaginal sono at that time showed 3.5 x 2.2 x 1.1 cm elongated, somewhat oval shaped  fluid filled structure adjacent to the right ovary c/w loculated distal right hydrosalpinx and  1.3 cm fundal uterine fibroid without a submucosal component.  Pt treated for presumed PID with rocephin, zithromax and doxycycline. She reports that her pain is improved but she still has achy pain in her right side (mid to lower abdomen), especially around the time of her menstrual cycle. No fever, chills, dysuria or vaginal discharge.   Pt also has right shoulder/arm pain that started at the same time as her abdominal pain. The pain begins in the right shoulder/neck area and goes into her deltoid area with some tingling and some sharp pain. Abducting or flexing the shoulder above 45 degrees makes the pain worse.   She is taking Aleve for both pains, and this provides temporary relief.    Review of Systems  Constitutional: Negative for fever and chills.  HENT: Negative for neck pain and neck stiffness.   Respiratory: Negative for chest tightness and shortness of breath.   Cardiovascular: Negative for chest pain and palpitations.  Gastrointestinal: Positive for abdominal pain and constipation. Negative for nausea, vomiting, diarrhea, blood in stool and abdominal distention.  Genitourinary: Negative for dysuria, urgency, frequency, vaginal bleeding, vaginal discharge and difficulty urinating.  Neurological: Negative for dizziness.       Objective:   Physical Exam  Constitutional: She is oriented to person, place, and time. She appears well-developed and well-nourished. No distress.  HENT:  Head: Normocephalic and atraumatic.  Eyes: Conjunctivae normal and EOM are normal.  Neck: Normal range of motion. Neck supple.    Cardiovascular: Normal rate, regular rhythm and normal heart sounds.   Pulmonary/Chest: Effort normal and breath sounds normal. No respiratory distress.  Abdominal: Soft. She exhibits no distension. There is tenderness (Right side, lower quadrant > upper or middle. But palpation on right side causes mild discomfort in mid-right abdomen.). There is no rebound and no guarding.  Musculoskeletal: Normal range of motion. She exhibits no edema and no tenderness.  Neurological: She is alert and oriented to person, place, and time.  Skin: Skin is warm and dry.  Psychiatric: She has a normal mood and affect.       Assessment & Plan:  36 y.o. female with Right pelvic pain 1.  Continue ibuprofen or aleve for pain as needed. Follow up ultrasound in 2 months 2.  R shoulder pain- likely AC joint entrapment or tendinopathy vs cervical radiculopathy. Follow up with PCP.

## 2012-03-10 ENCOUNTER — Ambulatory Visit (INDEPENDENT_AMBULATORY_CARE_PROVIDER_SITE_OTHER): Payer: 59 | Admitting: Obstetrics & Gynecology

## 2012-03-10 ENCOUNTER — Encounter: Payer: Self-pay | Admitting: Obstetrics & Gynecology

## 2012-03-10 VITALS — BP 133/83 | HR 75 | Temp 97.3°F | Ht 65.0 in | Wt 201.6 lb

## 2012-03-10 DIAGNOSIS — N898 Other specified noninflammatory disorders of vagina: Secondary | ICD-10-CM

## 2012-03-10 DIAGNOSIS — B373 Candidiasis of vulva and vagina: Secondary | ICD-10-CM

## 2012-03-10 LAB — POCT URINALYSIS DIP (DEVICE)
Hgb urine dipstick: NEGATIVE
Nitrite: NEGATIVE
Urobilinogen, UA: 0.2 mg/dL (ref 0.0–1.0)
pH: 7 (ref 5.0–8.0)

## 2012-03-10 MED ORDER — FLUCONAZOLE 150 MG PO TABS: 150.0000 mg | ORAL_TABLET | Freq: Once | ORAL | Status: DC

## 2012-03-10 NOTE — Progress Notes (Signed)
  Subjective:    Patient ID: Susan Roth, female    DOB: 03-Nov-1975, 36 y.o.   MRN: [id number]  [id number] Patient's last menstrual period was 02/25/2012. She comes today with about 2 days of a vaginal discharge which she thinks may be related to the pelvic cyst that was diagnosed on ultrasound. She had been given an antibiotic. The discharge is somewhat thick. There is not a lot itching or odor.    Review of Systems No abnormal bleeding or pain    Objective:   Physical Exam Filed Vitals:   03/10/12 1539  BP: 133/83  Pulse: 75  Temp:    No acute distress normal affect Pelvic: External genitalia appear normal there is a white vaginal discharge consistent with yeast. Cervix appears normal. Wet prep was obtained. Uterus normal size nontender no masses       Assessment & Plan:  Inspect yeast vaginitis after taking antibiotic. I prescribed Diflucan 150 mg by mouth single dose. She is to followup in November for repeat ultrasound and office visit  Dr. Scheryl Darter  03/10/2012  5:29 PM

## 2012-03-10 NOTE — Patient Instructions (Signed)
Vaginitis  Vaginitis in a soreness, swelling and redness (inflammation) of the vagina and vulva. This is not a sexually transmitted infection.   CAUSES   Yeast vaginitis is caused by yeast (candida) that is normally found in your vagina. With a yeast infection, the candida has over grown in number to a point that upsets the chemical balance.  SYMPTOMS    White thick vaginal discharge.   Swelling, itching, redness and irritation of the vagina and possibly the lips of the vagina (vulva).   Burning or painful urination.   Painful intercourse.  HOME CARE INSTRUCTIONS    Finish all medication as prescribed.   Do not have sex until treatment is completed or instructed by your healthcare giver.   Take warm sitz baths.   Do not douche.   Do not use tampons, especially scented ones.   Wear cotton underwear.   Avoid tight pants and panty hose.   Tell your sexual partner that you have a yeast infection. They should go to their caregiver if they have symptoms such as mild rash or itching.   Your sexual partner should be treated if your infection is difficult to eliminate.   Practice safer sex. Use condoms.   Some vaginal medications cause latex condoms to fail. Ask your caregiver this.  SEEK MEDICAL CARE IF:    You develop a fever.   The infection is getting worse after 2 days of treatment.   The infection is not getting better after 3 days of treatment.   You develop blisters in or around your vagina.   You develop vaginal bleeding, and it is not your menstrual period.   You have pain when you urinate.   You develop intestinal problems.   You have pain with sexual intercourse.  Document Released: 06/14/2004 Document Revised: 04/26/2011 Document Reviewed: 01/20/2009  [employer] Patient Information 2012 [employer].

## 2012-03-11 LAB — WET PREP, GENITAL
Clue Cells Wet Prep HPF POC: NONE SEEN
Trich, Wet Prep: NONE SEEN
Yeast Wet Prep HPF POC: NONE SEEN

## 2012-03-23 ENCOUNTER — Emergency Department (HOSPITAL_COMMUNITY)
Admission: EM | Admit: 2012-03-23 | Discharge: 2012-03-23 | Disposition: A | Discharge status: Home / Self Care | Payer: 59 | Attending: Emergency Medicine | Admitting: Emergency Medicine

## 2012-03-23 ENCOUNTER — Encounter (HOSPITAL_COMMUNITY): Payer: Self-pay | Admitting: Emergency Medicine

## 2012-03-23 DIAGNOSIS — Z8619 Personal history of other infectious and parasitic diseases: Secondary | ICD-10-CM | POA: Insufficient documentation

## 2012-03-23 DIAGNOSIS — M549 Dorsalgia, unspecified: Secondary | ICD-10-CM

## 2012-03-23 DIAGNOSIS — M545 Low back pain, unspecified: Secondary | ICD-10-CM | POA: Insufficient documentation

## 2012-03-23 DIAGNOSIS — Z8719 Personal history of other diseases of the digestive system: Secondary | ICD-10-CM | POA: Insufficient documentation

## 2012-03-23 DIAGNOSIS — Z87442 Personal history of urinary calculi: Secondary | ICD-10-CM | POA: Insufficient documentation

## 2012-03-23 MED ORDER — IBUPROFEN 800 MG PO TABS
800.0000 mg | ORAL_TABLET | Freq: Once | ORAL | Status: AC
  Administered 2012-03-23: 800 mg via ORAL
  Filled 2012-03-23: qty 1

## 2012-03-23 MED ORDER — HYDROCODONE-ACETAMINOPHEN 5-325 MG PO TABS
1.0000 | ORAL_TABLET | Freq: Once | ORAL | Status: AC
  Administered 2012-03-23: 1 via ORAL
  Filled 2012-03-23: qty 1

## 2012-03-23 MED ORDER — IBUPROFEN 800 MG PO TABS: 800.0000 mg | ORAL_TABLET | Freq: Three times a day (TID) | ORAL | Status: DC

## 2012-03-23 MED ORDER — HYDROCODONE-ACETAMINOPHEN 5-325 MG PO TABS: 1.0000 | ORAL_TABLET | ORAL | Status: DC | PRN

## 2012-03-23 NOTE — ED Notes (Signed)
Pt reports low back pain, radiating to r/buttocks. after hard BM yesterday.. Recent hx of constipation. Followed by [facility] for cyst on R/fallopian tune Reports numbness in r/arm . Strength, grip equal, both hands and arms

## 2012-03-23 NOTE — ED Provider Notes (Signed)
History     CSN: [id number]  Arrival date & time 03/23/12  0840   First MD Initiated Contact with Patient 03/23/12 1011      Chief Complaint  Patient presents with  . Back Pain    low back pain , r/buttocks    (Consider location/radiation/quality/duration/timing/severity/associated sxs/prior treatment) Patient is a 36 y.o. female presenting with back pain. The history is provided by the patient.  Back Pain  This is a new problem. The current episode started 2 days ago. The problem occurs constantly. The pain is associated with no known injury. The pain is present in the lumbar spine. The pain radiates to the right thigh. The pain is moderate. The symptoms are aggravated by twisting, bending and certain positions. Pertinent negatives include no fever, no numbness, no headaches, no abdominal pain, no bowel incontinence, no bladder incontinence and no weakness. Associated symptoms comments: Lower back pain that is worse with movement better with rest, without known injury. No history of back problems. No numbness or weakness. She has upper back pain and tingling in upper right extremity that is chronic and unchanged. .    Past Medical History  Diagnosis Date  . Allergic rhinitis   . Herpes zoster without mention of complication   . Dysfunction of eustachian tube   . Kidney calculi     Past Surgical History  Procedure Date  . Wisdom tooth extraction   . Tonsillectomy     as an adult - 2011  . Cesarean section 1998    x1    Family History  Problem Relation Age of Onset  . Hyperlipidemia Mother   . Hypertension Mother   . Nephrolithiasis Mother   . Crohn's disease Mother   . Nephrolithiasis Father   . Depression Father     bipolar  . Cancer Father     colon  . Asthma Other   . Allergies Other   . Allergies Other   . Clotting disorder Other   . Cancer Other     Cervical  . Arthritis Other   . Cancer Other     Colon  . Diabetes Other   . Asthma Sister     History   Substance Use Topics  . Smoking status: Never Smoker   . Smokeless tobacco: Not on file     Comment: positive hx of passive tobacco smoke exposure  . Alcohol Use: 0.6 oz/week    1 Glasses of wine per week     Comment: occasionally    OB History    Grav Para Term Preterm Abortions TAB SAB Ect Mult Living   1 1 0 1 0 0 0 0 0 1       Review of Systems  Constitutional: Negative for fever and chills.  Gastrointestinal: Negative.  Negative for nausea, abdominal pain and bowel incontinence.  Genitourinary: Negative for bladder incontinence.  Musculoskeletal: Positive for back pain.  Skin: Negative.   Neurological: Negative.  Negative for weakness, numbness and headaches.    Allergies  Amoxicillin; Penicillins; and Wellbutrin  Home Medications   Current Outpatient Rx  Name  Route  Sig  Dispense  Refill  . ADULT MULTIVITAMIN W/MINERALS CH   Oral   Take 1 tablet by mouth daily.           BP 122/85  Pulse 92  Temp 98.8 F (37.1 C) (Oral)  Resp 16  Wt 198 lb (89.812 kg)  SpO2 100%  LMP 03/20/2012  Physical Exam  Constitutional: She is  oriented to person, place, and time. She appears well-developed and well-nourished.  Neck: Normal range of motion.  Pulmonary/Chest: Effort normal.  Abdominal: There is no tenderness.  Musculoskeletal: Normal range of motion. She exhibits no edema.       Right paralumbar tenderness without spasm. No swelling.  Neurological: She is alert and oriented to person, place, and time. She displays normal reflexes. Coordination normal.  Skin: Skin is warm and dry.    ED Course  Procedures (including critical care time)  Labs Reviewed - No data to display No results found.   No diagnosis found.  1. Muscular lower back pain  MDM  Exam supports musculoskeletal back pain.        Rodena Medin, PA-C 03/23/12 1315

## 2012-03-24 NOTE — ED Provider Notes (Signed)
Medical screening examination/treatment/procedure(s) were performed by non-physician practitioner and as supervising physician I was immediately available for consultation/collaboration.   Maecyn Panning E Jalina Blowers, MD 03/24/12 0814 

## 2012-04-13 ENCOUNTER — Inpatient Hospital Stay (HOSPITAL_COMMUNITY): Payer: 59

## 2012-04-13 ENCOUNTER — Inpatient Hospital Stay (HOSPITAL_COMMUNITY)
Admission: AD | Admit: 2012-04-13 | Discharge: 2012-04-13 | Disposition: A | Discharge status: Hospice | Payer: 59 | Source: Ambulatory Visit | Attending: Obstetrics & Gynecology | Admitting: Obstetrics & Gynecology

## 2012-04-13 DIAGNOSIS — L293 Anogenital pruritus, unspecified: Secondary | ICD-10-CM | POA: Insufficient documentation

## 2012-04-13 DIAGNOSIS — N7013 Chronic salpingitis and oophoritis: Secondary | ICD-10-CM | POA: Insufficient documentation

## 2012-04-13 DIAGNOSIS — N7011 Chronic salpingitis: Secondary | ICD-10-CM

## 2012-04-13 DIAGNOSIS — M549 Dorsalgia, unspecified: Secondary | ICD-10-CM | POA: Insufficient documentation

## 2012-04-13 DIAGNOSIS — A5901 Trichomonal vulvovaginitis: Secondary | ICD-10-CM | POA: Insufficient documentation

## 2012-04-13 LAB — URINE MICROSCOPIC-ADD ON

## 2012-04-13 LAB — WET PREP, GENITAL
Clue Cells Wet Prep HPF POC: NONE SEEN
Yeast Wet Prep HPF POC: NONE SEEN

## 2012-04-13 LAB — URINALYSIS, ROUTINE W REFLEX MICROSCOPIC
Bilirubin Urine: NEGATIVE
Glucose, UA: NEGATIVE mg/dL
Nitrite: NEGATIVE
Specific Gravity, Urine: 1.025 (ref 1.005–1.030)
pH: 6 (ref 5.0–8.0)

## 2012-04-13 MED ORDER — METRONIDAZOLE 500 MG PO TABS
2000.0000 mg | ORAL_TABLET | Freq: Once | ORAL | Status: AC
  Administered 2012-04-13: 2000 mg via ORAL
  Filled 2012-04-13: qty 4

## 2012-04-13 NOTE — MAU Note (Signed)
Pt reports she has been dx for ovarian cyst (clinic downstairs). Started having back spasm 2 weeks ago.( pt does have hx of chronic back pain that she sees a neurologist for). Went to [facility] for them. Taking medication that sometimes helps. Started having some vaginal bleeding yesterday none today. Though the back pain might be related to her cyct because she bled yesterday. LMP 03/20/12.

## 2012-04-13 NOTE — MAU Provider Note (Signed)
History     CSN: [id number]  Arrival date and time: 04/13/12 1631   None     Chief Complaint  Patient presents with  . Back Pain   HPI  Pt is not pregnant and was seen 3 weeks ago in GYN clinic for an ovairan cyst(hydrosalpinx) and is due next week for a repeat ultrasound.  She started having spotting yesterday.  She has had back pain for 2 weeks.  Pt went to the bathroom on Nov 1 and her back pain became increased.  She denies vaginal discharge,.  She has inside vaginal itching.  She was given one DIflucan for her yeast infection when she was seen in the clinic. Pt has been taking gabapentin for pain.  She was given Ibuprofen and Vicodin from [facility] ED for her back pain.  She feels bloated.    Past Medical History  Diagnosis Date  . Allergic rhinitis   . Herpes zoster without mention of complication   . Dysfunction of eustachian tube   . Kidney calculi     Past Surgical History  Procedure Date  . Wisdom tooth extraction   . Tonsillectomy     as an adult - 2011  . Cesarean section 1998    x1    Family History  Problem Relation Age of Onset  . Hyperlipidemia Mother   . Hypertension Mother   . Nephrolithiasis Mother   . Crohn's disease Mother   . Nephrolithiasis Father   . Depression Father     bipolar  . Cancer Father     colon  . Asthma Other   . Allergies Other   . Allergies Other   . Clotting disorder Other   . Cancer Other     Cervical  . Arthritis Other   . Cancer Other     Colon  . Diabetes Other   . Asthma Sister     History  Substance Use Topics  . Smoking status: Never Smoker   . Smokeless tobacco: Not on file     Comment: positive hx of passive tobacco smoke exposure  . Alcohol Use: 0.6 oz/week    1 Glasses of wine per week     Comment: occasionally    Allergies:  Allergies  Allergen Reactions  . Amoxicillin Hives  . Penicillins Hives  . Wellbutrin (Bupropion Hcl) Hives    Prescriptions prior to admission  Medication Sig Dispense  Refill  . gabapentin (NEURONTIN) 300 MG capsule Take 300 mg by mouth 3 (three) times daily.      Marland Kitchen HYDROcodone-acetaminophen (NORCO/VICODIN) 5-325 MG per tablet Take 1 tablet by mouth every 4 (four) hours as needed for pain.  12 tablet  0  . ibuprofen (ADVIL,MOTRIN) 800 MG tablet Take 1 tablet (800 mg total) by mouth 3 (three) times daily.  21 tablet  0  . Multiple Vitamin (MULTIVITAMIN WITH MINERALS) TABS Take 1 tablet by mouth daily.      Marland Kitchen OVER THE COUNTER MEDICATION Take 3 capsules by mouth daily as needed. Supplement Curamin.  Take for pain.        Review of Systems  Constitutional: Negative for fever and chills.  Gastrointestinal: Positive for abdominal pain. Negative for nausea, vomiting, diarrhea and constipation.  Genitourinary: Negative for dysuria and urgency.  Musculoskeletal: Positive for back pain.   Physical Exam   Blood pressure 133/83, pulse 93, temperature 97.5 F (36.4 C), temperature source Oral, resp. rate 18, height 5\' 4"  (1.626 m), weight 204 lb 12.8 oz (92.897 kg),  last menstrual period 03/20/2012.  Physical Exam  Nursing note and vitals reviewed. Constitutional: She is oriented to person, place, and time. She appears well-developed and well-nourished.  HENT:  Head: Normocephalic.  Eyes: Pupils are equal, round, and reactive to light.  Neck: Normal range of motion. Neck supple.  Cardiovascular: Normal rate.   Respiratory: Effort normal.  GI: Soft. She exhibits no distension. There is tenderness. There is no rebound and no guarding.  Genitourinary:       Small amount of red watery discharge in vault; cervix clean, mildly tender; right adnexa tender; left adnexa without palpable enlargement or tenderness  Musculoskeletal: Normal range of motion.  Neurological: She is alert and oriented to person, place, and time.  Skin: Skin is warm and dry.  Psychiatric: She has a normal mood and affect.    MAU Course  Procedures Clinical Data: Pelvic pain. History of  prior right sided  hydrosalpinx.  TRANSVAGINAL ULTRASOUND OF PELVIS  Technique: Transvaginal ultrasound examination of the pelvis was  performed including evaluation of the uterus, ovaries, adnexal  regions, and pelvic cul-de-sac.  Comparison: 01/31/2012.  Findings:  Uterus: Retroverted measuring 7.9 x 4.3 x 4.9 cm. Small right-  sided lesion and it is heterogeneous in echotexture (generally  hyperechoic) measuring 1.3 x 1.1 x 1.3 cm, compatible with a small  fibroid.  Endometrium: 6 mm in thickness.  Right ovary: Normal in echotexture and appearance measuring 3.6 x  1.6 x 2.4 cm. Multiple small follicles.  Left ovary: Normal in echotexture and appearance measuring 4.0 x  1.3 x 2.4 cm. Multiple small follicles.  Other Findings: Elongated tubular appearing structure adjacent to  the right ovary measuring approximately 3.5 x 1.3 x 2.2 cm, most  likely to represent a residual hydrosalpinx. No significant free  fluid the cul-de-sac.  IMPRESSION:  1. Probable right-sided hydrosalpinx is very similar to the prior  examination 01/31/2012.  2. 1.3 x 1.1 x 1.3 cm fibroid in the mid body of the uterus.  Original Report Authenticated By: Trudie Reed, M.D.  Results for orders placed during the hospital encounter of 04/13/12 (from the past 24 hour(s))  URINALYSIS, ROUTINE W REFLEX MICROSCOPIC     Status: Abnormal   Collection Time   04/13/12  4:50 PM      Component Value Range   Color, Urine YELLOW  YELLOW   APPearance CLEAR  CLEAR   Specific Gravity, Urine 1.025  1.005 - 1.030   pH 6.0  5.0 - 8.0   Glucose, UA NEGATIVE  NEGATIVE mg/dL   Hgb urine dipstick MODERATE (*) NEGATIVE   Bilirubin Urine NEGATIVE  NEGATIVE   Ketones, ur NEGATIVE  NEGATIVE mg/dL   Protein, ur NEGATIVE  NEGATIVE mg/dL   Urobilinogen, UA 0.2  0.0 - 1.0 mg/dL   Nitrite NEGATIVE  NEGATIVE   Leukocytes, UA LARGE (*) NEGATIVE  URINE MICROSCOPIC-ADD ON     Status: Abnormal   Collection Time   04/13/12  4:50 PM       Component Value Range   Squamous Epithelial / LPF MANY (*) RARE   WBC, UA 7-10  <3 WBC/hpf   RBC / HPF 3-6  <3 RBC/hpf   Bacteria, UA FEW (*) RARE   Urine-Other MUCOUS PRESENT    POCT PREGNANCY, URINE     Status: Normal   Collection Time   04/13/12  4:59 PM      Component Value Range   Preg Test, Ur NEGATIVE  NEGATIVE  WET PREP, GENITAL  Status: Abnormal   Collection Time   04/13/12  7:04 PM      Component Value Range   Yeast Wet Prep HPF POC NONE SEEN  NONE SEEN   Trich, Wet Prep MODERATE (*) NONE SEEN   Clue Cells Wet Prep HPF POC NONE SEEN  NONE SEEN   WBC, Wet Prep HPF POC FEW (*) NONE SEEN  GC/Chlamydia negative on 01/31/2012- not repeated at this visit Dr. Marice Potter consulted about management of pt Flagyl 2gm given in MAU PO Assessment and Plan  Trichomas vaginitis- treated in MAU with Flagyl 2 gm Hydrosalpinx and fibroid- follow up in GYN clinic- clinic to call pt for appointment  Ronold Hardgrove 04/13/2012, 5:40 PM

## 2012-04-13 NOTE — MAU Note (Signed)
Pt c/o bowel movement yesterday and noticed pinkish spotting after she wiped.  She was concerned that the bleeding might have something to do with the cyst the clinic told her about four weeks ago.

## 2012-04-14 ENCOUNTER — Telehealth: Payer: Self-pay | Admitting: Obstetrics and Gynecology

## 2012-04-14 NOTE — Telephone Encounter (Signed)
Patient called and left message with c/o lower back pain. RTC to patient to which she tells me that she has already gone to MAU for it and resolved issue for the moment. A f/u appointment with Dr. Debroah Loop is made for 04/23/12 @2 :45 to discuss ovarian cysts and options for surgery. Patient agrees and satisfied.

## 2012-04-15 LAB — URINE CULTURE: Colony Count: 7000

## 2012-04-16 ENCOUNTER — Ambulatory Visit (HOSPITAL_COMMUNITY): Payer: 59

## 2012-04-23 ENCOUNTER — Ambulatory Visit: Payer: 59 | Admitting: Obstetrics & Gynecology

## 2012-05-13 ENCOUNTER — Telehealth: Payer: Self-pay | Admitting: *Deleted

## 2012-05-13 NOTE — Telephone Encounter (Signed)
Patient requesting dioxyline for acne

## 2012-05-15 ENCOUNTER — Ambulatory Visit (INDEPENDENT_AMBULATORY_CARE_PROVIDER_SITE_OTHER): Payer: 59 | Admitting: Internal Medicine

## 2012-05-15 ENCOUNTER — Encounter: Payer: Self-pay | Admitting: Internal Medicine

## 2012-05-15 VITALS — BP 118/64 | HR 81 | Temp 98.9°F | Resp 12 | Wt 195.1 lb

## 2012-05-15 DIAGNOSIS — L708 Other acne: Secondary | ICD-10-CM

## 2012-05-15 MED ORDER — BENZOYL PEROXIDE-ERYTHROMYCIN 5-3 % EX GEL: Freq: Two times a day (BID) | CUTANEOUS | Status: DC

## 2012-05-15 NOTE — Progress Notes (Signed)
  Subjective:    Patient ID: Susan Roth, female    DOB: Apr 17, 1976, 36 y.o.   MRN: [id number]  HPI Susan Roth presents for flare of acnein a peri-oral distribution noticed since working out at [employer] on a regular basis. No new cosmetics or other contact allergens. No evidence of secondary infection.  PMH, FamHx and SocHx reviewed for any changes and relevance. Current Outpatient Prescriptions on File Prior to Visit  Medication Sig Dispense Refill  . HYDROcodone-acetaminophen (NORCO/VICODIN) 5-325 MG per tablet Take 1 tablet by mouth every 4 (four) hours as needed for pain.  12 tablet  0  . ibuprofen (ADVIL,MOTRIN) 800 MG tablet Take 1 tablet (800 mg total) by mouth 3 (three) times daily.  21 tablet  0  . Multiple Vitamin (MULTIVITAMIN WITH MINERALS) TABS Take 1 tablet by mouth daily.      Marland Kitchen OVER THE COUNTER MEDICATION Take 3 capsules by mouth daily as needed. Supplement Curamin.  Take for pain.      Marland Kitchen Phentermine-Topiramate (QSYMIA) 3.75-23 MG CP24 Take 1 capsule by mouth daily.      Marland Kitchen gabapentin (NEURONTIN) 300 MG capsule Take 300 mg by mouth 3 (three) times daily.          Review of Systems System review is negative for any constitutional, cardiac, pulmonary, GI or neuro symptoms or complaints other than as described in the HPI.     Objective:   Physical Exam Filed Vitals:   05/15/12 0944  BP: 118/64  Pulse: 81  Temp: 98.9 F (37.2 C)  Resp: 12   gen'l- overweight AA woman in no distress HEENT- C&S clear. Cor- 2+ radial pulse, regular Pulm - normal respirations Derm - several pustular lesions around mouth, chin and nose.       Assessment & Plan:

## 2012-05-15 NOTE — Patient Instructions (Addendum)
Post-adolescent acne - working out has probably stimulated increased androgen (female hormone) production leading to a flare of acne. Plan -  benzoyl peroxide 5% w/ topical erythromycin gel 3% applied to a cleaned face twice a day.  Call if this doesn't help for a dermatology referral   Acne Acne is a skin problem that causes pimples. Acne occurs when the pores in your skin get blocked. Your pores may become red, sore, and swollen (inflamed), or infected with a common skin bacterium (Propionibacterium acnes). Acne is a common skin problem. Up to 80% of people get acne at some time. Acne is especially common from the ages of 75 to 29. Acne usually goes away over time with proper treatment. CAUSES   Your pores each contain an oil gland. The oil glands make an oily substance called sebum. Acne happens when these glands get plugged with sebum, dead skin cells, and dirt. The P. acnes bacteria that are normally found in the oil glands then multiply, causing inflammation. Acne is commonly triggered by changes in your hormones. These hormonal changes can cause the oil glands to get bigger and to make more sebum. Factors that can make acne worse include:  Hormone changes during adolescence.   Hormone changes during women's menstrual cycles.   Hormone changes during pregnancy.   Oil-based cosmetics and hair products.   Harshly scrubbing the skin.   Strong soaps.   Stress.   Hormone problems due to certain diseases.   Long or oily hair rubbing against the skin.   Certain medicines.   Pressure from headbands, backpacks, or shoulder pads.   Exposure to certain oils and chemicals.  SYMPTOMS   Acne often occurs on the face, neck, chest, and upper back. Symptoms include:  Small, red bumps (pimples or papules).   Whiteheads (closed comedones).   Blackheads (open comedones).   Small, pus-filled pimples (pustules).   Big, red pimples or pustules that feel tender.  More severe acne can  cause:  An infected area that contains a collection of pus (abscess).   Hard, painful, fluid-filled sacs (cysts).   Scars.  DIAGNOSIS  Your caregiver can usually tell what the problem is by doing a physical exam. TREATMENT   There are many good treatments for acne. Some are available over-the-counter and some are available with a prescription. The treatment that is best for you depends on the type of acne you have and how severe it is. It may take 2 months of treatment before your acne gets better. Common treatments include:  Creams and lotions that prevent oil glands from clogging.   Creams and lotions that treat or prevent infections and inflammation.   Antibiotics applied to the skin or taken as a pill.   Pills that decrease sebum production.   Birth control pills.   Light or laser treatments.   Minor surgery.   Injections of medicine into the affected areas.   Chemicals that cause peeling of the skin.  HOME CARE INSTRUCTIONS   Good skin care is the most important part of treatment.  Wash your skin gently at least twice a day and after exercise. Always wash your skin before bed.   Use mild soap.   After each wash, apply a water-based skin moisturizer.   Keep your hair clean and off of your face. Shampoo your hair daily.   Only take medicines as directed by your caregiver.   Use a sunscreen or sunblock with SPF 30 or greater. This is especially important when you  are using acne medicines.   Choose cosmetics that are noncomedogenic. This means they do not plug the oil glands.   Avoid leaning your chin or forehead on your hands.   Avoid wearing tight headbands or hats.   Avoid picking or squeezing your pimples. This can make your acne worse and cause scarring.  SEEK MEDICAL CARE IF:    Your acne is not better after 8 weeks.   Your acne gets worse.   You have a large area of skin that is red or tender.  Document Released: 05/04/2000 Document Revised:  07/30/2011 Document Reviewed: 02/23/2011 [facility] Patient Information 2013 [place], [place].     Benzoyl Peroxide skin cream, gel or lotion What is this medicine? BENZOYL PEROXIDE (BEN zoe ill per OX ide) is used on the skin to treat mild to moderate acne. This medicine may be used for other purposes; ask your health care provider or pharmacist if you have questions. What should I tell my health care provider before I take this medicine? They need to know if you have any of these conditions: -asthma -skin disease, abrasions, irritation or infection -sunburn -an unusual or allergic reaction to benzoic acid, cinnamon, parabens, sulfites, other medicines, foods, dyes, or preservatives -pregnant or trying to get pregnant -breast-feeding How should I use this medicine? This medicine is for external use only. Do not take by mouth. Follow the directions on the prescription label. Before using, wash affected area with a gentle cleanser and pat dry. Do not apply to raw or irritated skin. Apply enough medicine to cover the area and rub in gently. Avoid getting medicine in your eyes, lips, nose, mouth, or other sensitive areas. Do not wash treated areas of skin for at least 1 hour after using the medicine. If you experience very dry and peeling skin or skin irritation, talk to your doctor or health care professional. Talk to your pediatrician or health care professional regarding the use of this medicine in children. Special care may be needed. Overdosage: If you think you have taken too much of this medicine contact a poison control center or emergency room at once. NOTE: This medicine is only for you. Do not share this medicine with others. What if I miss a dose? If you miss a dose, use it as soon as you can. If it is almost time for your next dose, use only that dose. Do not use double or extra doses. What may interact with this medicine? -adapalene -isotretinoin -salicylic acid or sulfur containing  products -topical antibiotics such as clindamycin or erythromycin -tretinoin This list may not describe all possible interactions. Give your health care provider a list of all the medicines, herbs, non-prescription drugs, or dietary supplements you use. Also tell them if you smoke, drink alcohol, or use illegal drugs. Some items may interact with your medicine. What should I watch for while using this medicine? Your acne may get worse during the first few weeks of treatment, and then start to get better. It may take 8 to 12 weeks before you see the full effect. If you do not see any improvement within 4 to 6 weeks, call your doctor or health care professional. Once you see a decrease in your acne, you may need to continue to use this medicine to control it. Do not use products that may dry the skin like medicated cosmetics, products that contain alcohol, or abrasive soaps or cleaners. Do not use other acne or skin treatment on the same area that you  use this medicine unless your doctor or health care professional tells you to. If you use these together they can cause severe skin irritation. This medicine can make you more sensitive to the sun. Keep out of the sun. If you cannot avoid being in the sun, wear protective clothing and use sunscreen. Do not use sun lamps or tanning beds/booths. This medicine may bleach hair or colored fabrics. Avoid getting the medicine on your clothes. What side effects may I notice from receiving this medicine? Side effects that you should report to your doctor or health care professional as soon as possible: -allergic reactions like skin rash, itching or hives, swelling of the face, lips, or tongue -severe burning, itching, reddening, crusting, or swelling of the treated areas Side effects that usually do not require medical attention (report to your doctor or health care professional if they continue or are bothersome): -increased sensitivity to the sun -mild burning  or stinging of the treated areas -red, inflamed, and irritated skin This list may not describe all possible side effects. Call your doctor for medical advice about side effects. You may report side effects to FDA at 1-800-FDA-1088. Where should I keep my medicine? Keep out of the reach of children. Store at room temperature between 15 and 30 degrees C (59 and 86 degrees F). Throw away any unused medication after the expiration date. NOTE: This sheet is a summary. It may not cover all possible information. If you have questions about this medicine, talk to your doctor, pharmacist, or health care provider.  2013, [employer]. (08/06/2007 4:11:05 PM)

## 2012-05-15 NOTE — Assessment & Plan Note (Signed)
Flare of post-adolescent acne, associated with increased physical activity.  Plan Benzoyl peroxide-topical erythromycin gel 5-3% applied bid

## 2012-06-06 ENCOUNTER — Ambulatory Visit (INDEPENDENT_AMBULATORY_CARE_PROVIDER_SITE_OTHER): Payer: 59 | Admitting: Obstetrics & Gynecology

## 2012-06-06 ENCOUNTER — Encounter: Payer: Self-pay | Admitting: Obstetrics & Gynecology

## 2012-06-06 VITALS — BP 125/79 | HR 79 | Temp 97.6°F | Ht 64.0 in | Wt 193.5 lb

## 2012-06-06 DIAGNOSIS — N7011 Chronic salpingitis: Secondary | ICD-10-CM

## 2012-06-06 DIAGNOSIS — N7013 Chronic salpingitis and oophoritis: Secondary | ICD-10-CM

## 2012-06-06 DIAGNOSIS — N946 Dysmenorrhea, unspecified: Secondary | ICD-10-CM

## 2012-06-06 NOTE — Progress Notes (Signed)
  Subjective:    Patient ID: Susan Roth, female    DOB: 12-Sep-1975, 37 y.o.   MRN: [id number]  HPI 20 M AA P1 (85 yo daughter) who is here today to discuss u/s findings of 3.5 x 1.3 cm right hydrosalpinx. She was seen in the [facility] 11/13 for RUQ pain. She reports periods that last 7 days and is very painful. She does not miss work due to the pain. She takes Aleve and tylenol with relief. She uses condoms currently but she is interested in some birth control. She took Yaz in the past but did not like it. She is interested in [place] (handout given today). She would also like to have laparoscopy to remove the right hydrosalpinx and look for endometriosis. We discussed permanent sterility in the form of bilateral salpingectomy, but she is not ready to commit to permanent sterility.   Review of Systems Sister has endometriosis, cousin also has endometriosis    Objective:   Physical Exam        Assessment & Plan:  As above I will send an email to [place] to schedule this surgery. If she still wants Mirena, I will place it during her surgery.

## 2012-06-06 NOTE — Patient Instructions (Signed)
Diagnostic Laparoscopy  Laparoscopy is a surgical procedure. It is used to diagnose and treat diseases inside the belly(abdomen). It is usually a brief, common, and relatively simple procedure. The laparoscopeis a thin, lighted, pencil-sized instrument. It is like a telescope. It is inserted into your abdomen through a small cut (incision). Your caregiver can look at the organs inside your body through this instrument. He or she can see if there is anything abnormal.  Laparoscopy can be done either in a hospital or outpatient clinic. You may be given a mild sedative to help you relax before the procedure. Once in the operating room, you will be given a drug to make you sleep (general anesthesia). Laparoscopy usually lasts less than 1 hour. After the procedure, you will be monitored in a recovery area until you are stable and doing well. Once you are home, it will take 2 to 3 days to fully recover.  RISKS AND COMPLICATIONS   Laparoscopy has relatively few risks. Your caregiver will discuss the risks with you before the procedure.  Some problems that can occur include:  · Infection.  · Bleeding.  · Damage to other organs.  · Anesthetic side effects.  PROCEDURE  Once you receive anesthesia, your surgeon inflates the abdomen with a harmless gas (carbon dioxide). This makes the organs easier to see. The laparoscope is inserted into the abdomen through a small incision. This allows your surgeon to see into the abdomen. Other small instruments are also inserted into the abdomen through other small openings. Many surgeons attach a video camera to the laparoscope to enlarge the view.  During a diagnostic laparoscopy, the surgeon may be looking for inflammation, infection, or cancer. Your surgeon may take tissue samples(biopsies). The samples are sent to a specialist in looking at cells and tissue samples (pathologist). The pathologist examines them under a microscope. Biopsies can help to diagnose or confirm a  disease.  AFTER THE PROCEDURE   · The gas is released from inside the abdomen.  · The incisions are closed with stitches (sutures). Because these incisions are small (usually less than 1/2 inch), there is usually minimal discomfort after the procedure. There may be some mild discomfort in the throat. This is from the tube placed in the throat while you were sleeping. You may have some mild abdominal discomfort. There may also be discomfort from the instrument placement incisions in the abdomen.  · The recovery time is shortened as long as there are no complications.  · You will rest in a recovery room until stable and doing well. As long as there are no complications, you may be allowed to go home.  FINDING OUT THE RESULTS OF YOUR TEST  Not all test results are available during your visit. If your test results are not back during the visit, make an appointment with your caregiver to find out the results. Do not assume everything is normal if you have not heard from your caregiver or the medical facility. It is important for you to follow up on all of your test results.  HOME CARE INSTRUCTIONS   · Take all medicines as directed.  · Only take over-the-counter or prescription medicines for pain, discomfort, or fever as directed by your caregiver.  · Resume daily activities as directed.  · Showers are preferred over baths.  · You may resume sexual activities in 1 week or as directed.  · Do not drive while taking narcotics.  SEEK MEDICAL CARE IF:   · There is increasing   abdominal pain.  · There is new pain in the shoulders (shoulder strap areas).  · You feel lightheaded or faint.  · You have the chills.  · You or your child has an oral temperature above 102° F (38.9° C).  · There is pus-like (purulent) drainage from any of the wounds.  · You are unable to pass gas or have a bowel movement.  · You feel sick to your stomach (nauseous) or throw up (vomit).  MAKE SURE YOU:   · Understand these instructions.  · Will watch  your condition.  · Will get help right away if you are not doing well or get worse.  Document Released: 08/13/2000 Document Revised: 07/30/2011 Document Reviewed: 05/07/2007  [employer]® Patient Information ©2013 [employer].

## 2012-06-17 ENCOUNTER — Ambulatory Visit: Payer: 59 | Admitting: Obstetrics & Gynecology

## 2012-06-25 ENCOUNTER — Ambulatory Visit (INDEPENDENT_AMBULATORY_CARE_PROVIDER_SITE_OTHER): Payer: 59 | Admitting: Obstetrics & Gynecology

## 2012-06-25 ENCOUNTER — Encounter: Payer: Self-pay | Admitting: Obstetrics & Gynecology

## 2012-06-25 VITALS — BP 121/81 | HR 87 | Resp 17 | Ht 64.5 in | Wt 190.0 lb

## 2012-06-25 DIAGNOSIS — Z01419 Encounter for gynecological examination (general) (routine) without abnormal findings: Secondary | ICD-10-CM

## 2012-06-25 DIAGNOSIS — E669 Obesity, unspecified: Secondary | ICD-10-CM

## 2012-06-25 DIAGNOSIS — Z124 Encounter for screening for malignant neoplasm of cervix: Secondary | ICD-10-CM

## 2012-06-25 NOTE — Progress Notes (Signed)
Subjective:    Susan Roth is a 37 y.o. female who presents for an annual exam. The patient has no complaints today. The patient is sexually active. GYN screening history: last pap: was normal. The patient wears seatbelts: yes. The patient participates in regular exercise: yes. Has the patient ever been transfused or tattooed?: yes. The patient reports that there is not domestic violence in her life.   Menstrual History: OB History    Grav Para Term Preterm Abortions TAB SAB Ect Mult Living   1 1 0 1 0 0 0 0 0 1       Menarche age: 77 Patient's last menstrual period was 06/10/2012.    The following portions of the patient's history were reviewed and updated as appropriate: allergies, current medications, past family history, past medical history, past social history, past surgical history and problem list.  Review of Systems A comprehensive review of systems was negative. She is married and has some vaginal dryness (I recommended Astroglide). She works for [employer] and had her flu vaccine there.   Objective:    BP 121/81  Pulse 87  Resp 17  Ht 5' 4.5" (1.638 m)  Wt 190 lb (86.183 kg)  BMI 32.11 kg/m2  LMP 06/10/2012  General Appearance:    Alert, cooperative, no distress, appears stated age  Head:    Normocephalic, without obvious abnormality, atraumatic  Eyes:    PERRL, conjunctiva/corneas clear, EOM's intact, fundi    benign, both eyes  Ears:    Normal TM's and external ear canals, both ears  Nose:   Nares normal, septum midline, mucosa normal, no drainage    or sinus tenderness  Throat:   Lips, mucosa, and tongue normal; teeth and gums normal  Neck:   Supple, symmetrical, trachea midline, no adenopathy;    thyroid:  no enlargement/tenderness/nodules; no carotid   bruit or JVD  Back:     Symmetric, no curvature, ROM normal, no CVA tenderness  Lungs:     Clear to auscultation bilaterally, respirations unlabored  Chest Wall:    No tenderness or deformity   Heart:     Regular rate and rhythm, S1 and S2 normal, no murmur, rub   or gallop  Breast Exam:    No tenderness, masses, or nipple abnormality  Abdomen:     Soft, non-tender, bowel sounds active all four quadrants,    no masses, no organomegaly  Genitalia:    Normal female without lesion, discharge or tenderness, acanthosis nigricans noted, NSSR, minimal mobility, normal adnexal exam     Extremities:   Extremities normal, atraumatic, no cyanosis or edema  Pulses:   2+ and symmetric all extremities  Skin:   Skin color, texture, turgor normal, no rashes or lesions  Lymph nodes:   Cervical, supraclavicular, and axillary nodes normal  Neurologic:   CNII-XII intact, normal strength, sensation and reflexes    throughout  .    Assessment:    Healthy female exam.  Hydrosalpinx - she is schedule for l/s and removal of hydrosalpinx as well as to look for endometriosis Strong FH of colon cancer   Plan:     Thin prep Pap smear. routine labs  Refer to GI for probable colonoscopy

## 2012-06-26 ENCOUNTER — Telehealth: Payer: Self-pay | Admitting: *Deleted

## 2012-06-26 LAB — LIPID PANEL
HDL: 49 mg/dL (ref >39)
LDL Cholesterol: 113 mg/dL — ABNORMAL HIGH (ref 0–99)
Triglycerides: 66 mg/dL (ref <150)
VLDL: 13 mg/dL (ref 0–40)

## 2012-06-26 LAB — COMPREHENSIVE METABOLIC PANEL
Alkaline Phosphatase: 55 U/L (ref 39–117)
Glucose, Bld: 89 mg/dL (ref 70–99)
Sodium: 136 mEq/L (ref 135–145)
Total Bilirubin: 0.6 mg/dL (ref 0.3–1.2)
Total Protein: 7.1 g/dL (ref 6.0–8.3)

## 2012-06-26 NOTE — Telephone Encounter (Signed)
Copy of labs mailed to pt's home address. 

## 2012-07-09 ENCOUNTER — Telehealth: Payer: Self-pay | Admitting: General Practice

## 2012-07-09 NOTE — Telephone Encounter (Signed)
Pt called and left message stating she has surgery scheduled for 3/17 with Dr Marice Potter but wants to know if the surgery can be done sooner because she is in a lot of pain especially when her period comes on and just wants to know if the surgery can be moved up.

## 2012-07-09 NOTE — Telephone Encounter (Signed)
Called pt after speaking with Susan Roth. I informed pt that currently there is not a sooner date for her surgery. If one becomes available she will receive a call from Susan.  Pt also asked the results of her Pap. I informed her that the Pap was negative for abnormality.  Pt voiced understanding of all information given.

## 2012-07-22 ENCOUNTER — Encounter (HOSPITAL_COMMUNITY): Payer: Self-pay | Admitting: Pharmacist

## 2012-07-28 ENCOUNTER — Encounter (HOSPITAL_COMMUNITY)
Admission: RE | Admit: 2012-07-28 | Discharge: 2012-07-28 | Disposition: A | Discharge status: Home / Self Care | Payer: 59 | Source: Ambulatory Visit | Attending: Obstetrics & Gynecology | Admitting: Obstetrics & Gynecology

## 2012-07-28 ENCOUNTER — Encounter (HOSPITAL_COMMUNITY): Payer: Self-pay

## 2012-07-28 LAB — CBC
MCH: 31.8 pg (ref 26.0–34.0)
MCV: 93.1 fL (ref 78.0–100.0)
Platelets: 275 10*3/uL (ref 150–400)
RDW: 12.4 % (ref 11.5–15.5)
WBC: 4.3 10*3/uL (ref 4.0–10.5)

## 2012-07-28 NOTE — Patient Instructions (Addendum)
Your procedure is scheduled on:08/04/12  Enter through the Main Entrance at :0930 am Pick up desk phone and dial [phone] and inform us of your arrival.  Please call [phone] if you have any problems the morning of surgery.  Remember: Do not eat or drink after midnight:Sunday   Take these meds the morning of surgery with a sip of water:none  DO NOT wear jewelry, eye make-up, lipstick,body lotion, or dark fingernail polish. Do not shave for 48 hours prior to surgery.  If you are to be admitted after surgery, leave suitcase in car until your room has been assigned. Patients discharged on the day of surgery will not be allowed to drive home.  Your procedure is scheduled on:  Enter through the Main Entrance at : Pick up desk phone and dial [phone] and inform us of your arrival.  Please call [phone] if you have any problems the morning of surgery.  Remember: Do not eat or drink after midnight:   Take these meds the morning of surgery with a sip of water:  DO NOT wear jewelry, eye make-up, lipstick,body lotion, or dark fingernail polish. Do not shave for 48 hours prior to surgery.  If you are to be admitted after surgery, leave suitcase in car until your room has been assigned. Patients discharged on the day of surgery will not be allowed to drive home.

## 2012-08-04 ENCOUNTER — Encounter (HOSPITAL_COMMUNITY): Payer: Self-pay | Admitting: Anesthesiology

## 2012-08-04 ENCOUNTER — Ambulatory Visit (HOSPITAL_COMMUNITY)
Admission: RE | Admit: 2012-08-04 | Discharge: 2012-08-04 | Disposition: A | Discharge status: Home / Self Care | Payer: 59 | Source: Ambulatory Visit | Attending: Obstetrics & Gynecology | Admitting: Obstetrics & Gynecology

## 2012-08-04 ENCOUNTER — Encounter (HOSPITAL_COMMUNITY)
Admission: RE | Disposition: A | Discharge status: Home / Self Care | Payer: Self-pay | Source: Ambulatory Visit | Attending: Obstetrics & Gynecology

## 2012-08-04 ENCOUNTER — Ambulatory Visit (HOSPITAL_COMMUNITY): Payer: 59 | Admitting: Anesthesiology

## 2012-08-04 DIAGNOSIS — N946 Dysmenorrhea, unspecified: Secondary | ICD-10-CM

## 2012-08-04 DIAGNOSIS — R1011 Right upper quadrant pain: Secondary | ICD-10-CM | POA: Insufficient documentation

## 2012-08-04 DIAGNOSIS — N80109 Endometriosis of ovary, unspecified side, unspecified depth: Secondary | ICD-10-CM | POA: Insufficient documentation

## 2012-08-04 DIAGNOSIS — N801 Endometriosis of ovary: Secondary | ICD-10-CM | POA: Insufficient documentation

## 2012-08-04 DIAGNOSIS — Z302 Encounter for sterilization: Secondary | ICD-10-CM

## 2012-08-04 DIAGNOSIS — N7013 Chronic salpingitis and oophoritis: Secondary | ICD-10-CM | POA: Insufficient documentation

## 2012-08-04 HISTORY — PX: LAPAROSCOPIC TUBAL LIGATION: SHX1937

## 2012-08-04 HISTORY — PX: BILATERAL SALPINGECTOMY: SHX5743

## 2012-08-04 SURGERY — LIGATION, FALLOPIAN TUBE, LAPAROSCOPIC
Anesthesia: General | Laterality: Right

## 2012-08-04 MED ORDER — NEOSTIGMINE METHYLSULFATE 1 MG/ML IJ SOLN
INTRAMUSCULAR | Status: AC
  Filled 2012-08-04: qty 1

## 2012-08-04 MED ORDER — LIDOCAINE HCL (CARDIAC) 20 MG/ML IV SOLN
INTRAVENOUS | Status: AC
  Filled 2012-08-04: qty 5

## 2012-08-04 MED ORDER — FENTANYL CITRATE 0.05 MG/ML IJ SOLN: 25.0000 ug | INTRAMUSCULAR | Status: DC | PRN

## 2012-08-04 MED ORDER — ROCURONIUM BROMIDE 100 MG/10ML IV SOLN
INTRAVENOUS | Status: DC | PRN
  Administered 2012-08-04: 30 mg via INTRAVENOUS

## 2012-08-04 MED ORDER — MIDAZOLAM HCL 5 MG/5ML IJ SOLN
INTRAMUSCULAR | Status: DC | PRN
  Administered 2012-08-04: 2 mg via INTRAVENOUS

## 2012-08-04 MED ORDER — FENTANYL CITRATE 0.05 MG/ML IJ SOLN
INTRAMUSCULAR | Status: AC
  Filled 2012-08-04: qty 5

## 2012-08-04 MED ORDER — LACTATED RINGERS IR SOLN
Status: DC | PRN
  Administered 2012-08-04: 3000 mL

## 2012-08-04 MED ORDER — ONDANSETRON HCL 4 MG/2ML IJ SOLN
INTRAMUSCULAR | Status: AC
  Filled 2012-08-04: qty 2

## 2012-08-04 MED ORDER — PROPOFOL 10 MG/ML IV EMUL
INTRAVENOUS | Status: AC
  Filled 2012-08-04: qty 20

## 2012-08-04 MED ORDER — KETOROLAC TROMETHAMINE 30 MG/ML IJ SOLN: 15.0000 mg | Freq: Once | INTRAMUSCULAR | Status: DC | PRN

## 2012-08-04 MED ORDER — FENTANYL CITRATE 0.05 MG/ML IJ SOLN
INTRAMUSCULAR | Status: AC
  Administered 2012-08-04: 50 ug via INTRAVENOUS
  Filled 2012-08-04: qty 2

## 2012-08-04 MED ORDER — LACTATED RINGERS IV SOLN
INTRAVENOUS | Status: DC
  Administered 2012-08-04 (×2): via INTRAVENOUS

## 2012-08-04 MED ORDER — GLYCOPYRROLATE 0.2 MG/ML IJ SOLN
INTRAMUSCULAR | Status: DC | PRN
  Administered 2012-08-04: 0.4 mg via INTRAVENOUS

## 2012-08-04 MED ORDER — ONDANSETRON HCL 4 MG/2ML IJ SOLN
INTRAMUSCULAR | Status: DC | PRN
  Administered 2012-08-04: 4 mg via INTRAVENOUS

## 2012-08-04 MED ORDER — BUPIVACAINE HCL (PF) 0.25 % IJ SOLN
INTRAMUSCULAR | Status: AC
  Filled 2012-08-04: qty 30

## 2012-08-04 MED ORDER — PROPOFOL 10 MG/ML IV EMUL
INTRAVENOUS | Status: DC | PRN
  Administered 2012-08-04: 200 mg via INTRAVENOUS

## 2012-08-04 MED ORDER — IBUPROFEN 800 MG PO TABS: 800.0000 mg | ORAL_TABLET | Freq: Three times a day (TID) | ORAL | Status: DC | PRN

## 2012-08-04 MED ORDER — KETOROLAC TROMETHAMINE 30 MG/ML IJ SOLN
INTRAMUSCULAR | Status: DC | PRN
  Administered 2012-08-04: 30 mg via INTRAVENOUS

## 2012-08-04 MED ORDER — MIDAZOLAM HCL 2 MG/2ML IJ SOLN
INTRAMUSCULAR | Status: AC
  Filled 2012-08-04: qty 2

## 2012-08-04 MED ORDER — OXYCODONE-ACETAMINOPHEN 5-325 MG PO TABS: 1.0000 | ORAL_TABLET | ORAL | Status: DC | PRN

## 2012-08-04 MED ORDER — FENTANYL CITRATE 0.05 MG/ML IJ SOLN
INTRAMUSCULAR | Status: DC | PRN
  Administered 2012-08-04 (×2): 100 ug via INTRAVENOUS

## 2012-08-04 MED ORDER — GLYCOPYRROLATE 0.2 MG/ML IJ SOLN
INTRAMUSCULAR | Status: AC
  Filled 2012-08-04: qty 2

## 2012-08-04 MED ORDER — ROCURONIUM BROMIDE 50 MG/5ML IV SOLN
INTRAVENOUS | Status: AC
  Filled 2012-08-04: qty 1

## 2012-08-04 MED ORDER — NEOSTIGMINE METHYLSULFATE 1 MG/ML IJ SOLN
INTRAMUSCULAR | Status: DC | PRN
  Administered 2012-08-04: 3 mg via INTRAVENOUS

## 2012-08-04 MED ORDER — KETOROLAC TROMETHAMINE 30 MG/ML IJ SOLN
INTRAMUSCULAR | Status: AC
  Filled 2012-08-04: qty 1

## 2012-08-04 MED ORDER — BUPIVACAINE HCL (PF) 0.25 % IJ SOLN
INTRAMUSCULAR | Status: DC | PRN
  Administered 2012-08-04: 22 mL

## 2012-08-04 MED ORDER — LIDOCAINE HCL (CARDIAC) 20 MG/ML IV SOLN
INTRAVENOUS | Status: DC | PRN
  Administered 2012-08-04: 75 mg via INTRAVENOUS

## 2012-08-04 SURGICAL SUPPLY — 26 items
CABLE HIGH FREQUENCY MONO STRZ (ELECTRODE) IMPLANT
CATH ROBINSON RED A/P 16FR (CATHETERS) ×3 IMPLANT
CHLORAPREP W/TINT 26ML (MISCELLANEOUS) ×3 IMPLANT
CLOTH BEACON ORANGE TIMEOUT ST (SAFETY) ×3 IMPLANT
CORD ACTIVE DISPOSABLE (ELECTRODE) ×1
CORD ELECTRO ACTIVE DISP (ELECTRODE) ×2 IMPLANT
ELECT REM PT RETURN 9FT ADLT (ELECTROSURGICAL)
ELECTRODE REM PT RTRN 9FT ADLT (ELECTROSURGICAL) IMPLANT
GLOVE BIO SURGEON STRL SZ 6.5 (GLOVE) ×3 IMPLANT
GOWN PREVENTION PLUS LG XLONG (DISPOSABLE) ×6 IMPLANT
IV LACTATED RINGER IRRG 3000ML (IV SOLUTION) ×1
IV LR IRRIG 3000ML ARTHROMATIC (IV SOLUTION) ×2 IMPLANT
NDL SAFETY ECLIPSE 18X1.5 (NEEDLE) ×2 IMPLANT
NEEDLE HYPO 18GX1.5 SHARP (NEEDLE) ×1
NEEDLE INSUFFLATION 120MM (ENDOMECHANICALS) ×3 IMPLANT
PACK LAPAROSCOPY BASIN (CUSTOM PROCEDURE TRAY) ×3 IMPLANT
SCALPEL HARMONIC ACE (MISCELLANEOUS) ×3 IMPLANT
SET IRRIG TUBING LAPAROSCOPIC (IRRIGATION / IRRIGATOR) ×3 IMPLANT
STRIP CLOSURE SKIN 1/4X4 (GAUZE/BANDAGES/DRESSINGS) ×3 IMPLANT
SUT VICRYL 0 UR6 27IN ABS (SUTURE) ×3 IMPLANT
SUT VICRYL 4-0 PS2 18IN ABS (SUTURE) ×3 IMPLANT
TOWEL OR 17X24 6PK STRL BLUE (TOWEL DISPOSABLE) ×6 IMPLANT
TROCAR OPTI TIP 5M 100M (ENDOMECHANICALS) ×3 IMPLANT
TROCAR XCEL DIL TIP R 11M (ENDOMECHANICALS) ×3 IMPLANT
TROCAR XCEL OPT SLVE 5M 100M (ENDOMECHANICALS) ×6 IMPLANT
WATER STERILE IRR 1000ML POUR (IV SOLUTION) ×3 IMPLANT

## 2012-08-04 NOTE — Anesthesia Preprocedure Evaluation (Signed)
Anesthesia Evaluation  Patient identified by MRN, date of birth, ID band Patient awake    Reviewed: Allergy & Precautions, H&P , NPO status , Patient's Chart, lab work & pertinent test results, reviewed documented beta blocker date and time   History of Anesthesia Complications Negative for: history of anesthetic complications  Airway Mallampati: III TM Distance: >3 FB Neck ROM: full    Dental  (+) Teeth Intact   Pulmonary neg pulmonary ROS,  breath sounds clear to auscultation  Pulmonary exam normal       Cardiovascular Exercise Tolerance: Good negative cardio ROS  Rhythm:regular Rate:Normal     Neuro/Psych negative neurological ROS  negative psych ROS   GI/Hepatic negative GI ROS, Neg liver ROS,   Endo/Other  obese  Renal/GU Renal disease (h/o kidney stone)  negative genitourinary   Musculoskeletal   Abdominal   Peds  Hematology negative hematology ROS (+)   Anesthesia Other Findings   Reproductive/Obstetrics negative OB ROS                           Anesthesia Physical Anesthesia Plan  ASA: II  Anesthesia Plan: General ETT   Post-op Pain Management:    Induction:   Airway Management Planned:   Additional Equipment:   Intra-op Plan:   Post-operative Plan:   Informed Consent: I have reviewed the patients History and Physical, chart, labs and discussed the procedure including the risks, benefits and alternatives for the proposed anesthesia with the patient or authorized representative who has indicated his/her understanding and acceptance.   Dental Advisory Given  Plan Discussed with: CRNA and Surgeon  Anesthesia Plan Comments:         Anesthesia Quick Evaluation

## 2012-08-04 NOTE — Transfer of Care (Signed)
Immediate Anesthesia Transfer of Care Note  Patient: Susan Roth  Procedure(s) Performed: Procedure(s) with comments: LAPAROSCOPIC TUBAL LIGATION (N/A) - Operative lsc for right salpingectomy & Mirena insertion BILATERAL SALPINGECTOMY (Right)  Patient Location: PACU  Anesthesia Type:General  Level of Consciousness: awake, alert  and oriented  Airway & Oxygen Therapy: Patient Spontanous Breathing and Patient connected to nasal cannula oxygen  Post-op Assessment: Report given to PACU RN and Post -op Vital signs reviewed and stable  Post vital signs: stable  Complications: No apparent anesthesia complications

## 2012-08-04 NOTE — Op Note (Signed)
08/04/2012  1:17 PM  PATIENT:  Susan Roth  37 y.o. female  PRE-OPERATIVE DIAGNOSIS:  NWG#[id number] Right Hydrosalpinx & RUQ Pain, dysmenorrhea, desire for permanent sterillity  POST-OPERATIVE DIAGNOSIS:  HYQ#[id number] Same + endometriosis  PROCEDURE:  DIAGNOSTIC LAPAROSCOPY BILATERAL SALPINGECTOMY FOR PERMANENT STERILITY LYSIS OF ADHESIONS  FINDINGS: normal upper abdomen, right endometrioma, both oviducts densely adherent to the ovaries and posterior surface of the uterus   SURGEON:  Surgeon(s) and Role:    * Allie Bossier, MD - Primary   PHYSICIAN ASSISTANT:   ASSISTANTS: Catalina Antigua, MD   ANESTHESIA:   general  EBL:  Total I/O In: 1800 [I.V.:1800] Out: 60 [Urine:50; Blood:10]  BLOOD ADMINISTERED:none  DRAINS: none   LOCAL MEDICATIONS USED:  MARCAINE     SPECIMEN:  Source of Specimen:  both oviducts  DISPOSITION OF SPECIMEN:  PATHOLOGY  COUNTS:  YES  TOURNIQUET:  * No tourniquets in log *  DICTATION: .Dragon Dictation  PLAN OF CARE: Discharge to home after PACU  PATIENT DISPOSITION:  PACU - hemodynamically stable.   Delay start of Pharmacological VTE agent (>24hrs) due to surgical blood loss or risk of bleeding: not applicable  The risks, benefits, and alternatives of surgery were explained, understood, accepted. She is certain that she wants permanent sterility. She understands that this is not reversible. She understands there is a small failure rate of this procedure. She also would like to have both oviducts removed her due newest recommendations to help prevent ovarian/peritoneal cancer as well as the ultrasound finding of a right hydrosalpinx. In the operating room she was placed in the dorsal lithotomy position, and general anesthesia was given without complication. Her abdomen and vagina were prepped and draped in the usual sterile fashion. A timeout procedure was done. A bimanual exam revealed a small anteverted and mobile uterus. Her adnexa felt normal. A  Hulka manipulator was placed. Her bladder was emptied with a Foley catheter. Gloves were changed, and attention was turned to the abdomen. Approximately the 5 mL of 0.5% Marcaine was injected into the umbilicus. A vertical incision was made at the site. A varies needle was placed intraperitoneally. Low-flow CO2 was used to insufflate the abdomen to approximately 3-1/2 L. Once a good pneumoperitoneum was established, a 10 mm Excel trocar was placed. Laparoscopy confirmed correct placement. A 5 mm port was placed in each lower quadrant under direct laparoscopic visualization after injecting 0.5% Marcaine in the incision sites. Her  upper abdomen appeared normal. Both oviducts were densely adherent to the ovaries and posterior side of the uterus. Sharp and Bovie scissors were used to carefully dissect the oviducts free.  A Harmonic scapel was used to hemostatically removed both oviducts. The left oviduct was actually filled with chocolate cyst fluid. I irrigated the pelvis so that it was free of this fluid.  I switched to a 5 mm camera and removed the oviducts through the umbilical port.  I removed the 5 mm ports and noted hemostasis. The umbilical fascia was closed with a 0 vicryl suture. No defects were palpable. A subcuticular closure was done with 4-0 Vicryl suture at all incision sites. A Steri-Strip was placed across each incision. She was extubated and taken to the recovery room in stable condition.

## 2012-08-04 NOTE — H&P (Signed)
HPI  8 M AA P1 (37 yo daughter) who is here today for a diagnostic laparoscopy and bilateral salpingectomy. She had an u/s with the findings of a 3.5 x 1.3 cm right hydrosalpinx. She was seen in the MAU 11/13 for RUQ pain. She reports periods that last 7 days and is very painful. She does not miss work due to the pain. She takes Aleve and tylenol with relief. She uses condoms currently but she is interested in permanent sterility. She took Yaz in the past but did not like it.   Pertinent Gynecological History: Menses: flow is moderate Bleeding: painful Contraception: condoms DES exposure: denies Blood transfusions: none Sexually transmitted diseases: no past history Previous GYN Procedures: cryosurgery at 37 yo  Last pap: normal Date: 2013    Menstrual History: Menarche age: 13 No LMP recorded.    Past Medical History  Diagnosis Date  . Allergic rhinitis   . Herpes zoster without mention of complication   . Dysfunction of eustachian tube   . Kidney calculi   . Obesity   . Medical history non-contributory     Past Surgical History  Procedure Laterality Date  . Wisdom tooth extraction    . Tonsillectomy      as an adult - 2011  . Cesarean section  1998    x1    Family History  Problem Relation Age of Onset  . Hyperlipidemia Mother   . Hypertension Mother   . Nephrolithiasis Mother   . Crohn's disease Mother   . Nephrolithiasis Father   . Depression Father     bipolar  . Cancer Father     colon  . Asthma Other   . Allergies Other   . Allergies Other   . Clotting disorder Other   . Cancer Other     Cervical  . Arthritis Other   . Cancer Other     Colon  . Diabetes Other   . Asthma Sister     Social History:  reports that she has never smoked. She has never used smokeless tobacco. She reports that she drinks about 0.6 ounces of alcohol per week. She reports that she does not use illicit drugs.  Allergies:  Allergies  Allergen Reactions  . Amoxicillin  Hives  . Penicillins Hives  . Wellbutrin (Bupropion Hcl) Hives    Prescriptions prior to admission  Medication Sig Dispense Refill  . benzoyl peroxide-erythromycin (BENZAMYCIN) gel Apply topically 2 (two) times daily.  23.3 g  3  . Multiple Vitamin (MULTIVITAMIN WITH MINERALS) TABS Take 1 tablet by mouth daily.        Review of Systems  Psychiatric/Behavioral: Substance abuse: cryo.    Blood pressure 124/80, pulse 69, temperature 98.2 F (36.8 C), temperature source Oral, resp. rate 18, SpO2 100.00%. Physical Exam  Heart- rrr Lungs- CTAB Abd- benign  Results for orders placed during the hospital encounter of 08/04/12 (from the past 24 hour(s))  PREGNANCY, URINE     Status: None   Collection Time    08/04/12  9:45 AM      Result Value Range   Preg Test, Ur NEGATIVE  NEGATIVE    No results found.  Assessment/Plan: Dysmenorrhea- I will look for endometriosis or any other cause of dysmenorrhea. Desire for permanent sterility- I will remove both oviducts (including the hydrosalpinx)  She understands the risks of surgery, including, but not to infection, bleeding, DVTs, damage to bowel, bladder, ureters. She wishes to proceed.    Susan Roth C. 08/04/2012, 10:47  AM

## 2012-08-04 NOTE — Anesthesia Postprocedure Evaluation (Signed)
  Anesthesia Post-op Note  Anesthesia Post Note  Patient: Susan Roth  Procedure(s) Performed: Procedure(s) (LRB): LAPAROSCOPIC TUBAL LIGATION (N/A) BILATERAL SALPINGECTOMY (Right)  Anesthesia type: General  Patient location: PACU  Post pain: Pain level controlled  Post assessment: Post-op Vital signs reviewed  Last Vitals:  Filed Vitals:   08/04/12 1300  BP: 114/69  Pulse: 62  Temp: 36.9 C  Resp: 16    Post vital signs: Reviewed  Level of consciousness: sedated  Complications: No apparent anesthesia complications

## 2012-08-05 ENCOUNTER — Encounter (HOSPITAL_COMMUNITY): Payer: Self-pay | Admitting: Obstetrics & Gynecology

## 2012-08-06 ENCOUNTER — Telehealth: Payer: Self-pay | Admitting: *Deleted

## 2012-08-06 ENCOUNTER — Inpatient Hospital Stay (HOSPITAL_COMMUNITY)
Admission: AD | Admit: 2012-08-06 | Discharge: 2012-08-06 | Disposition: A | Discharge status: Home / Self Care | Payer: 59 | Source: Ambulatory Visit | Attending: Obstetrics & Gynecology | Admitting: Obstetrics & Gynecology

## 2012-08-06 ENCOUNTER — Encounter (HOSPITAL_COMMUNITY): Payer: Self-pay | Admitting: *Deleted

## 2012-08-06 DIAGNOSIS — K5900 Constipation, unspecified: Secondary | ICD-10-CM

## 2012-08-06 HISTORY — DX: Endometriosis, unspecified: N80.9

## 2012-08-06 NOTE — Telephone Encounter (Signed)
i spoke with patient and informed her that she can use dulcolax suppositories. She has been using miralax daily. Pt states that she will try this and if no success will call back.

## 2012-08-06 NOTE — MAU Note (Signed)
Pt reports she had a salpingectomy on 03/17, has not had a BM since surgery. Has tried a Dulculax today and has been taking miralax all week but has not helped.

## 2012-08-06 NOTE — Telephone Encounter (Signed)
Patient left a message stating that she had surgery Monday and she is unable to have a bowel movement. She wants to know what she can do.

## 2012-08-06 NOTE — MAU Provider Note (Signed)
History     CSN: [id number]  Arrival date and time: 08/06/12 2128   None     Chief Complaint  Patient presents with  . Constipation   HPI Comments: 37 y.o. F s/p diagnostic laparascopy and bilateral salpingectomy on 08/04/12 and reports that she has not had a BM since surgery (2 days). Called clinic and was told to try a Dulculax suppository today and has been taking miralax x3 doses but has not helped.  Last BM was 6 days ago. Constipation is a chronic issue for her; she usually has a BM every 3-4 days. She does not eat a high fiber diet. Minimal abdominal pain- last took narcotics early this AM. Is passing gas. Able to tolerate a regular diet without nausea or vomiting. No rectal bleeding.   Constipation This is a chronic problem. The current episode started in the past 7 days. The problem is unchanged. Her stool frequency is 2 to 3 times per week. The stool is described as firm. The patient is not on a high fiber diet. She does not exercise regularly. There has not been adequate water intake. Associated symptoms include abdominal pain, back pain and flatus. Pertinent negatives include no diarrhea, difficulty urinating, fever, hematochezia, melena, nausea, rectal pain or vomiting. Risk factors include immobility and obesity. She has tried laxatives and enemas for the symptoms. The treatment provided no relief. Her past medical history is significant for abdominal surgery.      Past Medical History  Diagnosis Date  . Allergic rhinitis   . Herpes zoster without mention of complication   . Dysfunction of eustachian tube   . Kidney calculi   . Obesity   . Medical history non-contributory     Past Surgical History  Procedure Laterality Date  . Wisdom tooth extraction    . Tonsillectomy      as an adult - 2011  . Cesarean section  1998    x1  . Laparoscopic tubal ligation N/A 08/04/2012    Procedure: LAPAROSCOPIC TUBAL LIGATION;  Surgeon: Allie Bossier, MD;  Location: [facility];   Service: Gynecology;  Laterality: N/A;  Operative lsc for right salpingectomy & Mirena insertion  . Bilateral salpingectomy Right 08/04/2012    Procedure: BILATERAL SALPINGECTOMY;  Surgeon: Allie Bossier, MD;  Location: [facility];  Service: Gynecology;  Laterality: Right;    Family History  Problem Relation Age of Onset  . Hyperlipidemia Mother   . Hypertension Mother   . Nephrolithiasis Mother   . Crohn's disease Mother   . Nephrolithiasis Father   . Depression Father     bipolar  . Cancer Father     colon  . Asthma Other   . Allergies Other   . Allergies Other   . Clotting disorder Other   . Cancer Other     Cervical  . Arthritis Other   . Cancer Other     Colon  . Diabetes Other   . Asthma Sister     History  Substance Use Topics  . Smoking status: Never Smoker   . Smokeless tobacco: Never Used     Comment: positive hx of passive tobacco smoke exposure  . Alcohol Use: 0.6 oz/week    1 Glasses of wine per week     Comment: occasionally    Allergies:  Allergies  Allergen Reactions  . Amoxicillin Hives  . Penicillins Hives  . Wellbutrin (Bupropion Hcl) Hives    Prescriptions prior to admission  Medication Sig Dispense Refill  .  benzoyl peroxide-erythromycin (BENZAMYCIN) gel Apply topically 2 (two) times daily.  23.3 g  3  . ibuprofen (ADVIL,MOTRIN) 800 MG tablet Take 1 tablet (800 mg total) by mouth every 8 (eight) hours as needed for pain.  60 tablet  2  . Multiple Vitamin (MULTIVITAMIN WITH MINERALS) TABS Take 1 tablet by mouth daily.      Marland Kitchen oxyCODONE-acetaminophen (PERCOCET/ROXICET) 5-325 MG per tablet Take 1 tablet by mouth every 4 (four) hours as needed for pain.  30 tablet  0    Review of Systems  Constitutional: Negative for fever and chills.  Gastrointestinal: Positive for abdominal pain, constipation and flatus. Negative for nausea, vomiting, diarrhea, blood in stool, melena, hematochezia and rectal pain.  Genitourinary: Negative for dysuria and difficulty  urinating.  Musculoskeletal: Positive for back pain.  Skin: Negative for rash.  Neurological: Negative for dizziness and headaches.   Physical Exam   Blood pressure 138/71, pulse 72, temperature 98.1 F (36.7 C), temperature source Oral, resp. rate 18, height 5\' 4"  (1.626 m), weight 89.359 kg (197 lb), last menstrual period 07/01/2012, SpO2 100.00%.  Physical Exam  Constitutional: She is oriented to person, place, and time. She appears well-developed and well-nourished. No distress.  HENT:  Head: Normocephalic and atraumatic.  Eyes: EOM are normal.  Neck: Normal range of motion. Neck supple.  Cardiovascular: Normal rate, regular rhythm and normal heart sounds.   No murmur heard. Respiratory: Effort normal and breath sounds normal. No respiratory distress.  GI: Soft. Bowel sounds are normal. She exhibits no distension and no mass. There is no tenderness. There is no rebound and no guarding.  BS are normoactive, x4 quad, not high pitched.  Incision site appears to be healing well, without redness or drainage   Musculoskeletal: Normal range of motion. She exhibits no edema.  Neurological: She is alert and oriented to person, place, and time.  Skin: Skin is warm and dry. No erythema.    MAU Course  Procedures  MDM Abdomen is soft, minimally tender, normal BS. Not a surgical abdomen. Does not appear to have an ileus. Constipation is a chronic issue for this pt.  Assessment and Plan  37 y.o. 2 days s/p laparoscopic exploration with bilateral salpingectomy now with constipation  -Increase miralax from qd to TID, continue use of dulcolax suppositories -Red flags for infection, ileus discussed -Discussed high fiber diet and increasing water intake to help with chronic component -If continues to be an issue, may need manual disimpaction; rectal exam deferred tonight per pt request but would be needed if still no BM in a few days   MCGILL,JACQUELYN 08/06/2012, 11:03 PM   I saw and  examined patient and agree with above resident note and plan. I reviewed history, op note, vitals and labs. Napoleon Form, MD

## 2012-08-07 ENCOUNTER — Telehealth: Payer: Self-pay | Admitting: Internal Medicine

## 2012-08-07 NOTE — Telephone Encounter (Signed)
Pt contacted surgeon and was told to increase miralax to tid. Pt will continue to follow recommendations of the surgeon and has appt to see Dr Leone Payor on 08/29/12 She will call if she worsens or does not have a bowel movement

## 2012-08-29 ENCOUNTER — Ambulatory Visit: Payer: 59 | Admitting: Internal Medicine

## 2012-09-09 ENCOUNTER — Encounter (HOSPITAL_COMMUNITY): Payer: Self-pay

## 2012-09-09 ENCOUNTER — Inpatient Hospital Stay (HOSPITAL_COMMUNITY)
Admission: AD | Admit: 2012-09-09 | Discharge: 2012-09-09 | Disposition: A | Discharge status: Home / Self Care | Payer: 59 | Source: Ambulatory Visit | Attending: Obstetrics & Gynecology | Admitting: Obstetrics & Gynecology

## 2012-09-09 DIAGNOSIS — N939 Abnormal uterine and vaginal bleeding, unspecified: Secondary | ICD-10-CM

## 2012-09-09 DIAGNOSIS — IMO0002 Reserved for concepts with insufficient information to code with codable children: Secondary | ICD-10-CM | POA: Insufficient documentation

## 2012-09-09 DIAGNOSIS — Y838 Other surgical procedures as the cause of abnormal reaction of the patient, or of later complication, without mention of misadventure at the time of the procedure: Secondary | ICD-10-CM | POA: Insufficient documentation

## 2012-09-09 LAB — WET PREP, GENITAL: Yeast Wet Prep HPF POC: NONE SEEN

## 2012-09-09 NOTE — MAU Provider Note (Signed)
Attestation of Attending Supervision of Advanced Practitioner (PA/CNM/NP): Evaluation and management procedures were performed by the Advanced Practitioner under my supervision and collaboration.  I have reviewed the Advanced Practitioner's note and chart, and I agree with the management and plan.  Endi Lagman, MD, FACOG Attending Obstetrician & Gynecologist Faculty Practice, [facility]  

## 2012-09-09 NOTE — MAU Provider Note (Signed)
History     CSN: [id number]  Arrival date and time: 09/09/12 1012   First Provider Initiated Contact with Patient 09/09/12 1054      Chief Complaint  Patient presents with  . Post-op Problem   HPI [name] is a 37 y.o. G1P0101 who presents to MAU with vaginal spotting. The patient states that she had bilateral salpingectomy on 08/04/12. She was told she could resume physical activity after 2-3 weeks. She had restarted Zumba classes last week without any problems. She did a 90 minute Zumba-thon on Saturday and had noticed some spotting yesterday. She is also having some low back pain. She had some mild cramping at the time she noticed the spotting, but no pain now. She is not bleeding anymore today. She denies UTI symptoms, fever, vaginal discharge.    OB History   Grav Para Term Preterm Abortions TAB SAB Ect Mult Living   1 1 0 1 0 0 0 0 0 1       Past Medical History  Diagnosis Date  . Allergic rhinitis   . Herpes zoster without mention of complication   . Dysfunction of eustachian tube   . Kidney calculi   . Obesity   . Medical history non-contributory   . Endometriosis     Past Surgical History  Procedure Laterality Date  . Wisdom tooth extraction    . Tonsillectomy      as an adult - 2011  . Cesarean section  1998    x1  . Laparoscopic tubal ligation N/A 08/04/2012    Procedure: LAPAROSCOPIC TUBAL LIGATION;  Surgeon: Allie Bossier, MD;  Location: [facility];  Service: Gynecology;  Laterality: N/A;  Operative lsc for right salpingectomy & Mirena insertion  . Bilateral salpingectomy Right 08/04/2012    Procedure: BILATERAL SALPINGECTOMY;  Surgeon: Allie Bossier, MD;  Location: [facility];  Service: Gynecology;  Laterality: Right;  . Colonoscopy      Family History  Problem Relation Age of Onset  . Hyperlipidemia Mother   . Hypertension Mother   . Nephrolithiasis Mother   . Crohn's disease Mother   . Nephrolithiasis Father   . Depression Father     bipolar  .  Colon cancer Father   . Asthma Other   . Allergies Other   . Allergies Other   . Clotting disorder Other   . Cervical cancer Other   . Arthritis Other   . Colon cancer Other   . Diabetes Other   . Asthma Sister     History  Substance Use Topics  . Smoking status: Never Smoker   . Smokeless tobacco: Never Used     Comment: positive hx of passive tobacco smoke exposure  . Alcohol Use: 0.6 oz/week    1 Glasses of wine per week     Comment: occasionally    Allergies:  Allergies  Allergen Reactions  . Amoxicillin Hives  . Penicillins Hives  . Wellbutrin (Bupropion Hcl) Hives    No prescriptions prior to admission    Review of Systems  Constitutional: Positive for malaise/fatigue. Negative for fever.  Gastrointestinal: Positive for nausea. Negative for vomiting, abdominal pain, diarrhea and constipation.  Genitourinary: Negative for dysuria, urgency and frequency.       + vaginal bleeding Neg - vaginal discharge  Musculoskeletal: Positive for back pain.  Neurological: Negative for dizziness, loss of consciousness and weakness.   Physical Exam   Blood pressure 114/67, pulse 63, temperature 97.9 F (36.6 C), temperature source  Oral, resp. rate 16, height 5\' 4"  (1.626 m), weight 194 lb 2 oz (88.055 kg), last menstrual period 08/19/2012.  Physical Exam  Constitutional: She is oriented to person, place, and time. She appears well-developed and well-nourished. No distress.  HENT:  Head: Normocephalic and atraumatic.  Cardiovascular: Normal rate, regular rhythm and normal heart sounds.   Respiratory: Effort normal and breath sounds normal. No respiratory distress.  GI: Soft. She exhibits no distension and no mass. There is no tenderness. There is no rebound and no guarding.  Genitourinary: Uterus normal. Vaginal discharge (scant mucus discharge noted. no bleeding) found.  Neurological: She is alert and oriented to person, place, and time.  Skin: Skin is warm and dry. No  erythema. No pallor.  Psychiatric: She has a normal mood and affect.   Results for orders placed during the hospital encounter of 09/09/12 (from the past 24 hour(s))  WET PREP, GENITAL     Status: Abnormal   Collection Time    09/09/12 11:05 AM      Result Value Range   Yeast Wet Prep HPF POC NONE SEEN  NONE SEEN   Trich, Wet Prep NONE SEEN  NONE SEEN   Clue Cells Wet Prep HPF POC NONE SEEN  NONE SEEN   WBC, Wet Prep HPF POC MODERATE (*) NONE SEEN    MAU Course  Procedures None  MDM Discussed with Dr. Macon Large. Ok to discharge today with bleeding precautions. Follow-up as scheduled.   Assessment and Plan  A: Vaginal bleeding s/p bilateral salpingectomy  P: Discharge home Bleeding precautions discussed Patient advised to keep follow-up appointments as scheduled Patient may return to MAU if her condition were to change or worsen  Freddi Starr, PA-C  09/09/2012, 1:39 PM

## 2012-09-09 NOTE — MAU Note (Signed)
Pt states has endometriosis, Dr. Marice Potter removed fallopian tubes only bilaterally on 08/04/2012. Had normal cycle 08/16/2012 lasting 4 days. Was told she could work out again after 2-3 weeks post-op. Did zumba-thon this past weekend for 90 minutes on Saturday. Noted lower back pain on Saturday, then bleeding began Monday. Spotting only at present. Urine looks dark. Deneis burning or urgency with voiding.

## 2012-09-10 ENCOUNTER — Ambulatory Visit (INDEPENDENT_AMBULATORY_CARE_PROVIDER_SITE_OTHER): Payer: 59 | Admitting: Obstetrics & Gynecology

## 2012-09-10 ENCOUNTER — Encounter: Payer: Self-pay | Admitting: Obstetrics & Gynecology

## 2012-09-10 VITALS — BP 121/83 | HR 69 | Temp 97.0°F | Ht 64.0 in | Wt 193.0 lb

## 2012-09-10 DIAGNOSIS — Z09 Encounter for follow-up examination after completed treatment for conditions other than malignant neoplasm: Secondary | ICD-10-CM

## 2012-09-10 NOTE — Progress Notes (Signed)
  Subjective:    Patient ID: Susan Roth, female    DOB: 1975/10/30, 37 y.o.   MRN: [id number]  HPI Ms. Haney is here for a post op visit. She had a l/s bilateral salpingectomy for permanent sterility. She has no complaints today. She has not had sex since surgery. She has been to the MAU twice since surgery. Once because of her long-standing constipation and the second time because she has some vaginal spotting after a Zumba class.   Review of Systems     Objective:   Physical Exam  Incisions- well healed Abdomen- benign     Assessment & Plan:   Post op- stable I have recommended that for non-emergent issues that she make an appt in the clinic instead of wasting her time with MAU visits.

## 2012-09-12 ENCOUNTER — Ambulatory Visit: Payer: 59 | Admitting: Obstetrics & Gynecology

## 2012-10-10 ENCOUNTER — Encounter: Payer: Self-pay | Admitting: Genetic Counselor

## 2012-10-10 ENCOUNTER — Ambulatory Visit: Payer: 59 | Admitting: Lab

## 2012-10-10 ENCOUNTER — Ambulatory Visit (HOSPITAL_BASED_OUTPATIENT_CLINIC_OR_DEPARTMENT_OTHER): Payer: 59 | Admitting: Genetic Counselor

## 2012-10-10 DIAGNOSIS — Z800 Family history of malignant neoplasm of digestive organs: Secondary | ICD-10-CM

## 2012-10-10 NOTE — Progress Notes (Signed)
Dr.  Illene Regulus requested a consultation for genetic counseling and risk assessment for Susan Roth, a 36 y.o. female, for discussion of her family history of a PMS2 mutation diagnosing Lynch syndrome. She presents to clinic today, with her paternal half sister, to discuss the possibility of a genetic predisposition to cancer, and to further clarify her risks, as well as her family members' risks for cancer.   HISTORY OF PRESENT ILLNESS: Susan Roth is a 37 y.o. female with no personal history of cancer.  She reportedly had a colonoscopy in the past that did not find colon polyps.  Past Medical History  Diagnosis Date  . Allergic rhinitis   . Herpes zoster without mention of complication   . Dysfunction of eustachian tube   . Kidney calculi   . Obesity   . Medical history non-contributory   . Endometriosis     Past Surgical History  Procedure Laterality Date  . Wisdom tooth extraction    . Tonsillectomy      as an adult - 2011  . Cesarean section  1998    x1  . Laparoscopic tubal ligation N/A 08/04/2012    Procedure: LAPAROSCOPIC TUBAL LIGATION;  Surgeon: Allie Bossier, MD;  Location: [facility];  Service: Gynecology;  Laterality: N/A;  Operative lsc for right salpingectomy & Mirena insertion  . Bilateral salpingectomy Right 08/04/2012    Procedure: BILATERAL SALPINGECTOMY;  Surgeon: Allie Bossier, MD;  Location: [facility];  Service: Gynecology;  Laterality: Right;  . Colonoscopy      History  Substance Use Topics  . Smoking status: Never Smoker   . Smokeless tobacco: Never Used     Comment: positive hx of passive tobacco smoke exposure  . Alcohol Use: 0.6 oz/week    1 Glasses of wine per week     Comment: occasionally    PERSONAL RISK ASSESSMENT FACTORS: Uterus Intact: Yes Ovaries Intact: Yes G1P1A1 , Patient has had a colonoscopy due to the family history of colon cancer Patient's sister has a documented PMS2 mutation.  Patient is at 50% risk for carrying the same  mutation.  FAMILY HISTORY:  We obtained a detailed, 4-generation family history.  Significant diagnoses are listed below: Family History  Problem Relation Age of Onset  . Hyperlipidemia Mother   . Hypertension Mother   . Nephrolithiasis Mother   . Crohn's disease Mother   . Nephrolithiasis Father   . Depression Father     bipolar  . Colon cancer Father 51  . Asthma Other   . Allergies Other   . Allergies Other   . Clotting disorder Other   . Cervical cancer Other   . Arthritis Other   . Colon cancer Other   . Diabetes Other   . Asthma Sister   . Colon cancer Paternal Aunt 30  . Colon cancer Paternal Uncle 47  . Colon polyps Sister     paternal half sister; Lynch syndrome  . Colon cancer Other     double great uncle from both mom and dads side    Patient's ancestors are of African American descent. There is no reported Ashkenazi Jewish ancestry. 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  The patient's paternal grandmother and maternal grandmother are half sisters.  GENETIC COUNSELING ASSESSMENT: Susan Roth is a 37 y.o. y.o. 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 We, therefore, discussed and recommended  the following at today's visit.   DISCUSSION: We reviewed the characteristics, features and inheritance patterns of hereditary cancer syndromes. We also discussed genetic testing, including the appropriate family members to test, the process of testing, insurance coverage and turn-around-time for results. We recommended Susan Roth pursue genetic testing for the known PMS2 mutation at [facility].   PLAN: After considering the risks, benefits, and limitations, Susan Roth provided informed consent to pursue genetic testing and the blood sample will be sent to [facility] for analysis of the PMS2. We discussed the implications of a positive, negative and/ or  variant of uncertain significance genetic test result. Results should be available within approximately 10 days to 2 weeks weeks' time, at which point they will be disclosed by telephone to Susan Roth, as will any additional recommendations warranted by these results. Susan Roth will receive a summary of her genetic counseling visit and a copy of her results once available. This information will also be available in Epic. We encouraged Susan Roth to remain in contact with cancer genetics annually so that we can continuously update the family history and inform her of any changes in cancer genetics and testing that may be of benefit for her family. Susan Roth's questions were answered to her satisfaction today. Our contact information was provided should additional questions or concerns arise.  After considering the risks, benefits, and limitations, the patient provided informed consent for the following testing: PMS2 single site testing through [facility].  Per the patient's request, we will contact her by telephone to discuss these results. A follow up genetic counseling visit will be scheduled if indicated.  The patient was seen for a total of 60 minutes, greater than 50% of which was spent face-to-face counseling.  This plan is being carried out per Dr. Illene Regulus' recommendations.  This note will also be sent to the referring provider via the electronic medical record. The patient will be supplied with a summary of this genetic counseling discussion as well as educational information on the discussed hereditary cancer syndromes following the conclusion of their visit.   Patient was discussed with Dr. Drue Second.  EPIC CC: Illene Regulus, MD Nicholaus Bloom, MD   _______________________________________________________________________ For Office Staff:  Number of people involved in session: 2 Was an Intern/ student involved with case: no }

## 2012-10-14 ENCOUNTER — Encounter: Payer: Self-pay | Admitting: Internal Medicine

## 2012-10-14 ENCOUNTER — Ambulatory Visit (INDEPENDENT_AMBULATORY_CARE_PROVIDER_SITE_OTHER): Payer: 59 | Admitting: Internal Medicine

## 2012-10-14 VITALS — BP 118/80 | HR 79 | Temp 97.9°F | Ht 64.0 in | Wt 194.8 lb

## 2012-10-14 DIAGNOSIS — N9089 Other specified noninflammatory disorders of vulva and perineum: Secondary | ICD-10-CM

## 2012-10-14 MED ORDER — METRONIDAZOLE 500 MG PO TABS: 500.0000 mg | ORAL_TABLET | Freq: Two times a day (BID) | ORAL | Status: DC

## 2012-10-14 NOTE — Patient Instructions (Addendum)
Vulvar swelling and irritation in the perienum: there appears to be a mild discharge and some mild skin erosion, suspect bacterial vaginosis.  Plan - metronidazole 500 mg twice a day for 7 days  Soak baths, cleanse with non-allergenic soaps  Avoid any products\   If not resolved will need to discuss with gynecologist.

## 2012-10-15 NOTE — Progress Notes (Signed)
  Subjective:    Patient ID: Susan Roth, female    DOB: 21-Jul-1975, 37 y.o.   MRN: [id number]  HPI Ms. Puchalski presents with the c/o swelling and discomfort in the vulvar region and perineum. She has pain with coitus. She has used a new dial soap but no other new products. She denies having any discharge. She has had no bleeding and denies any trauma. She will be seeing her gyn in a couple of weeks but wanted evaluation sooner.  PMH, FamHx and SocHx reviewed for any changes and relevance.  No current outpatient prescriptions on file prior to visit.   No current facility-administered medications on file prior to visit.      Review of Systems System review is negative for any constitutional, cardiac, pulmonary, GI or neuro symptoms or complaints other than as described in the HPI.     Objective:   Physical Exam Filed Vitals:   10/14/12 1147  BP: 118/80  Pulse: 79  Temp: 97.9 F (36.6 C)   Gen'l- WNWD AA woman in no distress Cor- 2+ radial pulse Pulm - normal respirations Abd - no guarding, rebound or tenderness Pelvic - superficial appearing erosions in the perineum and vulva; no open lesions; no vesicular lesions. A grey discharge was noted at the vaginal opening with some malodor. Speculum exam deferred.       Assessment & Plan:  Vulvar swelling and irritation in the perienum: there appears to be a mild discharge and some mild skin erosion, suspect bacterial vaginosis.  Plan - metronidazole 500 mg twice a day for 7 days  Soak baths, cleanse with non-allergenic soaps  Avoid any products\   If not resolved will need to discuss with gynecologist.

## 2012-10-28 ENCOUNTER — Telehealth: Payer: Self-pay | Admitting: Genetic Counselor

## 2012-10-28 ENCOUNTER — Encounter: Payer: Self-pay | Admitting: Genetic Counselor

## 2012-10-28 NOTE — Telephone Encounter (Signed)
Revealed negative genetic testing and that she did not have the PMS2  mutation found in the family.

## 2012-10-29 ENCOUNTER — Ambulatory Visit (INDEPENDENT_AMBULATORY_CARE_PROVIDER_SITE_OTHER): Payer: 59 | Admitting: Obstetrics & Gynecology

## 2012-10-29 ENCOUNTER — Encounter: Payer: Self-pay | Admitting: Obstetrics & Gynecology

## 2012-10-29 VITALS — BP 115/73 | HR 90 | Temp 98.6°F | Ht 64.0 in | Wt 194.0 lb

## 2012-10-29 DIAGNOSIS — N898 Other specified noninflammatory disorders of vagina: Secondary | ICD-10-CM

## 2012-10-29 DIAGNOSIS — R5381 Other malaise: Secondary | ICD-10-CM

## 2012-10-29 DIAGNOSIS — R5383 Other fatigue: Secondary | ICD-10-CM

## 2012-10-29 DIAGNOSIS — IMO0002 Reserved for concepts with insufficient information to code with codable children: Secondary | ICD-10-CM

## 2012-10-29 LAB — COMPREHENSIVE METABOLIC PANEL
ALT: 11 U/L (ref 0–35)
AST: 13 U/L (ref 0–37)
Albumin: 4.3 g/dL (ref 3.5–5.2)
Alkaline Phosphatase: 65 U/L (ref 39–117)
Glucose, Bld: 101 mg/dL — ABNORMAL HIGH (ref 70–99)
Potassium: 3.8 mEq/L (ref 3.5–5.3)
Sodium: 136 mEq/L (ref 135–145)
Total Protein: 7.4 g/dL (ref 6.0–8.3)

## 2012-10-29 LAB — TSH: TSH: 1.848 u[IU]/mL (ref 0.350–4.500)

## 2012-10-29 LAB — CBC
MCH: 31.4 pg (ref 26.0–34.0)
MCHC: 34.2 g/dL (ref 30.0–36.0)
Platelets: 348 10*3/uL (ref 150–400)

## 2012-10-29 NOTE — Progress Notes (Signed)
States here because she is swollen, sore after intercourse  .States saw her GP last week and it looked like erosion. Also c/o feeling tired all the time and pain on right side of chest/breast/arm that she has sometimes and hard to breathe when she works out.

## 2012-10-29 NOTE — Progress Notes (Signed)
  Subjective:    Patient ID: Susan Roth, female    DOB: 20-Sep-1975, 37 y.o.   MRN: [id number]  HPI 37 yo AA lady is here with 2 separate complaints. She is here because of dyspareunia (external) for the last 4 months. Astroglide did not relieve the issue. Her exam last month of her perineum was normal but today there is a slit like red area noted (?HSV). She was treated with flagyl for a vaginal discharge last week. She says that her symptomatic discharge is resolved.  Her other complaint is that of 3 weeks of fatigue.   Review of Systems     Objective:   Physical Exam  Perineum as above. Moderate amount of greyish vaginal discharge noted. (c/w BV      Assessment & Plan:  Fatigue- check TSH, CBC, CMETA, Vitamin D level. I have recommended that she go back to Dr. Arthur Holms for follow up of this symptom. Vaginal discharge- I will send a wet prep Perineal lesion/external dyspareunia- check HSV IgG

## 2012-10-30 LAB — VITAMIN D 25 HYDROXY (VIT D DEFICIENCY, FRACTURES): Vit D, 25-Hydroxy: 32 ng/mL (ref 30–89)

## 2012-10-30 LAB — WET PREP, GENITAL

## 2012-10-30 LAB — HSV 2 ANTIBODY, IGG: HSV 2 Glycoprotein G Ab, IgG: 8.39 IV — ABNORMAL HIGH

## 2012-11-04 ENCOUNTER — Telehealth: Payer: Self-pay | Admitting: *Deleted

## 2012-11-04 MED ORDER — VALACYCLOVIR HCL 1 G PO TABS: 1000.0000 mg | ORAL_TABLET | Freq: Two times a day (BID) | ORAL | Status: DC

## 2012-11-04 NOTE — Telephone Encounter (Addendum)
Message copied by Jill Side on Tue Nov 04, 2012  2:09 PM ------      Message from: Nicholaus Bloom C      Created: Mon Nov 03, 2012  2:20 PM       She has the herpes. Please give her generic valtrex 1 gram BID for a week with 6 refills. ------  Called pt and informed her of test result confirming herpes. Rx sent to pharmacy and pt informed. Since this is the pt's first outbreak, I informed her of the common sx of outbreak and advised that she get a refill of medication and take immediately. She should notify us if she is having frequent outbreaks as she may then prefer to take prophylactic dose of medication.  Pt voiced understanding of all information and advice given.

## 2012-11-13 ENCOUNTER — Encounter: Payer: Self-pay | Admitting: Internal Medicine

## 2012-11-13 ENCOUNTER — Ambulatory Visit (INDEPENDENT_AMBULATORY_CARE_PROVIDER_SITE_OTHER): Payer: 59 | Admitting: Internal Medicine

## 2012-11-13 VITALS — BP 102/60 | HR 80 | Ht 64.0 in | Wt 196.5 lb

## 2012-11-13 DIAGNOSIS — K589 Irritable bowel syndrome without diarrhea: Secondary | ICD-10-CM

## 2012-11-13 MED ORDER — PHILLIPS COLON HEALTH PO CAPS: 1.0000 | ORAL_CAPSULE | Freq: Every day | ORAL | Status: DC

## 2012-11-13 NOTE — Patient Instructions (Addendum)
Stop fiber and start colon health one a day to put the good bacteria back in your colon, take for a month.   Follow up with [place] as needed.   I appreciate the opportunity to care for you.

## 2012-11-13 NOTE — Assessment & Plan Note (Signed)
Clinical history is very compatible with this, people off and go from constipation to loose stools. Perhaps the antibiotics she is having last few months and disrupting her bacterial flora. I'm recommending she stopped using a fiber supplement and take a probiotic for a month. [facility] probiotic samples provided to the patient. If this fails to work she will call for followup.

## 2012-11-13 NOTE — Progress Notes (Signed)
  Subjective:    Patient ID: Susan Roth, female    DOB: [birth year], 37 y.o.   MRN: [id number]  HPI Patient is here for followup. In the past she been seeing for constipation, she had some rectal bleeding and abdominal pain and had an unrevealing colonoscopy. She had called [place] and the spraying after some gynecologic surgery and she was severely constipated, MiraLax relieved that. Now she is having some crampy right-sided abdominal pain radiating into the back, she is having loose stools and she cannot distinguish pain gas and stool at times. No rectal bleeding. She has been taking a fiber supplement. Medications, allergies, past medical history, past surgical history, family history and social history are reviewed and updated in the EMR.  Review of Systems As above, she is also having a fair amount of fatigue and she was having dyspareunia and probably had a bacterial vaginosis    Objective:   Physical Exam Obese pleasant black woman in no acute distress Abdomen is soft nontender without organomegaly or mass Rectal exam is performed in the presence of female staff demonstrates no anoderm or perianal changes. Digital rectal exam reveals minimal stool no rectocele, normal rectal tone and no mass. There is no impaction.     Assessment & Plan:   1. IBS (irritable bowel syndrome)

## 2012-11-17 ENCOUNTER — Telehealth: Payer: Self-pay | Admitting: Internal Medicine

## 2012-11-17 NOTE — Telephone Encounter (Signed)
Patient wanted you to know that she has had two BMs today with blood in the stool and on the tissue.  She notes it started this am after her workout.  She is also c/o large amounts of gas.  She is not taking Miralax and states that at present she is not constipated.  She has no other complaints.  Please advise

## 2012-11-17 NOTE — Telephone Encounter (Signed)
Left message for patient to call back  

## 2012-11-17 NOTE — Telephone Encounter (Signed)
Rectal bleeding is new since I saw her She will need an evaluation in the office this week if possible (advanced practitioner is ok)

## 2012-11-18 ENCOUNTER — Ambulatory Visit: Payer: 59 | Admitting: Nurse Practitioner

## 2012-11-18 NOTE — Telephone Encounter (Signed)
Patient was a no show for the appt today with Willette Cluster RNP

## 2012-11-18 NOTE — Telephone Encounter (Signed)
OK - she will need to call back to [place] then

## 2012-11-18 NOTE — Telephone Encounter (Signed)
Patient is scheduled to see Willette Cluster RNP this afternoon at 3:30

## 2012-12-30 ENCOUNTER — Encounter: Payer: Self-pay | Admitting: Internal Medicine

## 2012-12-30 ENCOUNTER — Ambulatory Visit (INDEPENDENT_AMBULATORY_CARE_PROVIDER_SITE_OTHER): Payer: 59 | Admitting: Internal Medicine

## 2012-12-30 ENCOUNTER — Other Ambulatory Visit (INDEPENDENT_AMBULATORY_CARE_PROVIDER_SITE_OTHER): Payer: 59

## 2012-12-30 VITALS — BP 102/70 | HR 76 | Temp 97.6°F | Ht 64.0 in | Wt 195.1 lb

## 2012-12-30 DIAGNOSIS — N342 Other urethritis: Secondary | ICD-10-CM | POA: Insufficient documentation

## 2012-12-30 LAB — URINALYSIS, ROUTINE W REFLEX MICROSCOPIC
Bilirubin Urine: NEGATIVE
Ketones, ur: NEGATIVE
Urine Glucose: NEGATIVE
Urobilinogen, UA: 0.2 (ref 0.0–1.0)

## 2012-12-30 MED ORDER — CIPROFLOXACIN HCL 500 MG PO TABS: 500.0000 mg | ORAL_TABLET | Freq: Two times a day (BID) | ORAL | Status: DC

## 2012-12-30 NOTE — Assessment & Plan Note (Signed)
Mild, for urine studies for antibx course,  to f/u any worsening symptoms or concerns

## 2012-12-30 NOTE — Progress Notes (Signed)
Subjective:    Patient ID: Susan Roth, female    DOB: 04-Apr-1976, 37 y.o.   MRN: [id number]  HPI   Here with acute onset dysuria for 2 days mostly end urination, but Denies urinary symptoms such as frequency, urgency, flank pain, hematuria or n/v, fever, chills. No hx of STD, hx of endometriosis, monagamous, partner most likely faithful Past Medical History  Diagnosis Date  . Allergic rhinitis   . Herpes zoster without mention of complication   . Dysfunction of eustachian tube   . Kidney calculi   . Obesity   . Medical history non-contributory   . Endometriosis    Past Surgical History  Procedure Laterality Date  . Wisdom tooth extraction    . Tonsillectomy      as an adult - 2011  . Cesarean section  1998    x1  . Laparoscopic tubal ligation N/A 08/04/2012    Procedure: LAPAROSCOPIC TUBAL LIGATION;  Surgeon: Allie Bossier, MD;  Location: [facility];  Service: Gynecology;  Laterality: N/A;  Operative lsc for right salpingectomy & Mirena insertion  . Bilateral salpingectomy Right 08/04/2012    Procedure: BILATERAL SALPINGECTOMY;  Surgeon: Allie Bossier, MD;  Location: [facility];  Service: Gynecology;  Laterality: Right;  . Colonoscopy      reports that she has never smoked. She has never used smokeless tobacco. She reports that she drinks about 0.6 ounces of alcohol per week. She reports that she does not use illicit drugs. family history includes Allergies in her others; Arthritis in her other; Asthma in her other and sister; Cervical cancer in her other; Clotting disorder in her other; Colon cancer in her others; Colon cancer (age of onset: 40) in her paternal aunt; Colon cancer (age of onset: 57) in her paternal uncle; Colon cancer (age of onset: 44) in her father; Colon polyps in her sister; Crohn's disease in her mother; Depression in her father; Diabetes in her other; Hyperlipidemia in her mother; Hypertension in her mother; and Nephrolithiasis in her father and mother. Allergies   Allergen Reactions  . Amoxicillin Hives  . Penicillins Hives  . Wellbutrin (Bupropion Hcl) Hives   Current Outpatient Prescriptions on File Prior to Visit  Medication Sig Dispense Refill  . cholecalciferol (VITAMIN D) 1000 UNITS tablet Take 1,000 Units by mouth daily.      . Probiotic Product (PHILLIPS COLON HEALTH) CAPS Take 1 capsule by mouth daily.  28 capsule  0  . valACYclovir (VALTREX) 1000 MG tablet Take 1 tablet (1,000 mg total) by mouth 2 (two) times daily.  14 tablet  6   No current facility-administered medications on file prior to visit.   Review of Systems All otherwise neg per pt     Objective:   Physical Exam BP 102/70  Pulse 76  Temp(Src) 97.6 F (36.4 C) (Oral)  Ht 5\' 4"  (1.626 m)  Wt 195 lb 2 oz (88.508 kg)  BMI 33.48 kg/m2  SpO2 97% VS noted,  Constitutional: Pt appears well-developed and well-nourished.  HENT: Head: NCAT.  Right Ear: External ear normal.  Left Ear: External ear normal.  Eyes: Conjunctivae and EOM are normal. Pupils are equal, round, and reactive to light.  Neck: Normal range of motion. Neck supple.  Cardiovascular: Normal rate and regular rhythm.   Pulmonary/Chest: Effort normal and breath sounds normal.  Abd:  Soft, NT, non-distended, + BS Neurological: Pt is alert. Not confused  Skin: Skin is warm. No erythema.  Psychiatric: Pt behavior is normal. Thought  content normal.     Assessment & Plan:

## 2012-12-30 NOTE — Patient Instructions (Signed)
Please take all new medication as prescribed Please continue all other medications as before, and refills have been done if requested. Please go to the LAB in the Basement (turn left off the elevator) for the tests to be done today - just the urine studies today You will be contacted by phone if any changes need to be made immediately.  Otherwise, you will receive a letter about your results with an explanation, but please check with MyChart first.  Please remember to sign up for My Chart if you have not done so, as this will be important to you in the future with finding out test results, communicating by private email, and scheduling acute appointments online when needed.

## 2012-12-31 LAB — GC/CHLAMYDIA PROBE AMP, URINE: GC Probe Amp, Urine: NEGATIVE

## 2013-01-01 LAB — URINE CULTURE: Colony Count: >=100000

## 2013-02-03 ENCOUNTER — Other Ambulatory Visit (INDEPENDENT_AMBULATORY_CARE_PROVIDER_SITE_OTHER): Payer: 59

## 2013-02-03 ENCOUNTER — Encounter: Payer: Self-pay | Admitting: Internal Medicine

## 2013-02-03 ENCOUNTER — Ambulatory Visit (INDEPENDENT_AMBULATORY_CARE_PROVIDER_SITE_OTHER): Payer: 59 | Admitting: Internal Medicine

## 2013-02-03 VITALS — BP 120/68 | HR 73 | Temp 97.6°F | Wt 195.0 lb

## 2013-02-03 DIAGNOSIS — R202 Paresthesia of skin: Secondary | ICD-10-CM

## 2013-02-03 DIAGNOSIS — R209 Unspecified disturbances of skin sensation: Secondary | ICD-10-CM

## 2013-02-03 DIAGNOSIS — H539 Unspecified visual disturbance: Secondary | ICD-10-CM

## 2013-02-03 DIAGNOSIS — IMO0001 Reserved for inherently not codable concepts without codable children: Secondary | ICD-10-CM
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
## 2013-02-03 NOTE — Patient Instructions (Addendum)
1. Burning pain and discomfort in the legs made worse by exertion. Best I can piece together this has been going on for a while. ON exam there is no evidence of any back issue: no disc disease, no nerve compression. On exam there are normal reflexes, normal sensation to light touch, pin-prick and vibration. This may be a metabolic problem like B12 deficiency vs a rheumatologic problem like myositis (muscle inflammation) vs a neurologic problem.  Plan No restriction in activity  Lab - today to check on the above.  If all the labs are normal we may need to reconsult with neurology.  2. Chest pain with exertion - a little unusual. You do not have a lot of cardiac risk factors.  Plan Close attention to this. If you have continue reproducible chest pressure with shortness of breath with exertion will need to have cardiac risk stratification/testing, e.g. Stress test.   3. Gyn and urology problems - seems quiet now and this is not directly related to the other symptoms.  4. Vision changes - you should see an ophthalmology - we will refer you to [facility] ophthalmology

## 2013-02-04 LAB — CK TOTAL AND CKMB (NOT AT ARMC): Total CK: 44 U/L (ref 7–177)

## 2013-02-06 LAB — VITAMIN B12: Vitamin B-12: 508 pg/mL (ref 211–911)

## 2013-02-09 ENCOUNTER — Encounter: Payer: Self-pay | Admitting: Internal Medicine

## 2013-02-09 DIAGNOSIS — R202 Paresthesia of skin: Secondary | ICD-10-CM | POA: Insufficient documentation

## 2013-03-11 ENCOUNTER — Ambulatory Visit: Payer: 59 | Admitting: Obstetrics & Gynecology

## 2013-03-26 ENCOUNTER — Other Ambulatory Visit: Payer: Self-pay

## 2013-04-29 ENCOUNTER — Telehealth: Payer: Self-pay | Admitting: Internal Medicine

## 2013-04-29 NOTE — Telephone Encounter (Signed)
Left message for patient to call back  

## 2013-04-30 ENCOUNTER — Encounter: Payer: Self-pay | Admitting: Nurse Practitioner

## 2013-04-30 ENCOUNTER — Ambulatory Visit: Payer: 59 | Admitting: Internal Medicine

## 2013-04-30 ENCOUNTER — Ambulatory Visit (INDEPENDENT_AMBULATORY_CARE_PROVIDER_SITE_OTHER): Payer: 59 | Admitting: Nurse Practitioner

## 2013-04-30 VITALS — BP 112/70 | HR 76 | Ht 64.5 in | Wt 193.1 lb

## 2013-04-30 DIAGNOSIS — Z0289 Encounter for other administrative examinations: Secondary | ICD-10-CM

## 2013-04-30 DIAGNOSIS — K589 Irritable bowel syndrome without diarrhea: Secondary | ICD-10-CM

## 2013-04-30 DIAGNOSIS — K5900 Constipation, unspecified: Secondary | ICD-10-CM

## 2013-04-30 MED ORDER — NA SULFATE-K SULFATE-MG SULF 17.5-3.13-1.6 GM/177ML PO SOLN: 1.0000 | Freq: Once | ORAL | Status: AC

## 2013-04-30 MED ORDER — LINACLOTIDE 145 MCG PO CAPS: 145.0000 ug | ORAL_CAPSULE | Freq: Every day | ORAL | Status: DC

## 2013-04-30 NOTE — Patient Instructions (Signed)
Sample of Linzess 145 mcg given today, please take one capsule by mouth once daily on an empty stomach. If this medication works for you please call back for a prescription.  Sample of Suprep given today, please follow directions on box. You can start mixing and drinking prep as soon as you get home.

## 2013-04-30 NOTE — Telephone Encounter (Signed)
Patient c/o constipation with pain to her back.  She will come in and see Willette Cluster RNP today at 2:30

## 2013-04-30 NOTE — Progress Notes (Signed)
     History of Present Illness:  Patient is a 36 year old female known to Dr. Leone Payor. She is followed here for constipation and abdominal pain. Patient had a normal colonoscopy  July 2011 (done for constipation and rectal bleeding). She's been diagnosed with irritable bowel syndrome. At her last visit in June of this year patient was actually complaining of loose stool felt to be secondary to recent antibiotics. We discontinued fiber and recommended a probiotic which she is still taking. She has also been taking MiraLax once a day. Twice daily MiraLax causes incontinence. Currently, her stools are loose, low volume and evacuation is incomplete. She's not had a normal bowel movement in 2 months. Patient has significant lower back pain which is typical for her when constipated.  Current Medications, Allergies, Past Medical History, Past Surgical History, Family History and Social History were reviewed in [employer] [facility] record.  Physical Exam: General: Pleasant, well developed , black female in no acute distress Head: Normocephalic and atraumatic Eyes:  sclerae anicteric, conjunctiva pink  Ears: Normal auditory acuity Lungs: Clear throughout to auscultation Heart: Regular rate and rhythm Abdomen: Limited exam, patient's back hurts too much to get on exam table. She was examined in standing position abdomen is soft, non distended, non-tender. No masses felt.  Rectal: No stool in vault (not impacted) Musculoskeletal: Symmetrical with no gross deformities  Extremities: No edema  Neurological: Alert oriented x 4, grossly nonfocal Psychological:  Alert and cooperative. Normal mood and affect  Assessment and Recommendations:  37 year old female with acute exacerbation of constipation predominant IBS. Once daily MiraLax not adequate but twice daily dosing causes incontinence. Patient is quite miserable with lower back pain from constipation. Will purge her bowels with a Suprep  today. Following that she will try Linzess (samples given). Patient will call us with a condition update, we can give her a prescription for linzess if it works.

## 2013-05-01 NOTE — Progress Notes (Signed)
Agree with Ms. Guenther's assessment and plan. Carl E. Gessner, MD, FACG   

## 2013-06-01 ENCOUNTER — Telehealth: Payer: Self-pay | Admitting: Internal Medicine

## 2013-06-01 MED ORDER — LINACLOTIDE 145 MCG PO CAPS: 145.0000 ug | ORAL_CAPSULE | Freq: Every day | ORAL | Status: DC

## 2013-06-01 NOTE — Telephone Encounter (Signed)
According to last office visit with Tye Savoy NP, ok to send in rx for Linzess.

## 2013-06-03 ENCOUNTER — Ambulatory Visit: Payer: 59 | Admitting: Internal Medicine

## 2013-08-07 ENCOUNTER — Other Ambulatory Visit: Payer: Self-pay | Admitting: Nurse Practitioner

## 2013-08-07 NOTE — Telephone Encounter (Signed)
Per Tye Savoy NP-C since the Linzess 145 mcg is helping, give you # 30 with several refills. Last seen by Nevin Bloodgood 04-30-2013.

## 2013-09-15 ENCOUNTER — Telehealth: Payer: Self-pay | Admitting: Internal Medicine

## 2013-09-15 MED ORDER — LINACLOTIDE 145 MCG PO CAPS: 145.0000 ug | ORAL_CAPSULE | Freq: Every day | ORAL | Status: DC

## 2013-09-15 NOTE — Telephone Encounter (Signed)
Refill sent in as requested. 

## 2014-04-06 ENCOUNTER — Ambulatory Visit (INDEPENDENT_AMBULATORY_CARE_PROVIDER_SITE_OTHER): Payer: 59 | Admitting: Family

## 2014-04-06 ENCOUNTER — Encounter: Payer: Self-pay | Admitting: Family

## 2014-04-06 VITALS — BP 122/80 | HR 81 | Temp 98.6°F | Resp 18 | Ht 64.0 in | Wt 190.0 lb

## 2014-04-06 DIAGNOSIS — J0100 Acute maxillary sinusitis, unspecified: Secondary | ICD-10-CM

## 2014-04-06 DIAGNOSIS — J019 Acute sinusitis, unspecified: Secondary | ICD-10-CM | POA: Insufficient documentation

## 2014-04-06 MED ORDER — HYDROCODONE-HOMATROPINE 5-1.5 MG/5ML PO SYRP: 5.0000 mL | ORAL_SOLUTION | Freq: Three times a day (TID) | ORAL | Status: DC | PRN

## 2014-04-06 MED ORDER — DOXYCYCLINE HYCLATE 100 MG PO TABS: 100.0000 mg | ORAL_TABLET | Freq: Two times a day (BID) | ORAL | Status: DC

## 2014-04-06 NOTE — Assessment & Plan Note (Signed)
Symptoms consistent with viral sinusitis; cannot rule out progression to bacterial. Start hycodan as needed for sleep and cough - discussed sedation side effects of the medication. If symptoms worsen or fail to gradually improve in the next 2-3 days start doxycycline 100 mg BID x 10 days. Continue OTC medications as needed for symptom relief. Follow up if symptoms worsen after antibiotic.

## 2014-04-06 NOTE — Progress Notes (Signed)
   Subjective:    Patient ID: Susan Roth, female    DOB: 1975/07/15, 38 y.o.   MRN: [id number]  Chief Complaint  Patient presents with  . Establish Care    has sinus drainage and cough, CRP was high from job labwork    HPI:  Susan Roth is a 38 y.o. female who presents today to establishing care and discuss lab work and sinus drainage.   1) Lab work - Had lab work at [employer]. She had a CRP previously that was 0.3 and this year was greater than 6.0. Concern for elevation and relation to heart disease risk. Pt reports all of her other blood work as being normal.   2) Sinus - Acute symptoms started about 4 days ago. Felt warm at work and when she got home from work after a nap felt sick. Has tried taking the Zyrtec which has helped a little. Currently experiencing - congestion, coughing (at night), nasal drainage. Started off as a scratchy throat which has since resolved. Describes some ethmoid sinus pressure. Denies headaches or fevers, nausea or vomiting.   Allergies  Allergen Reactions  . Amoxicillin Hives  . Penicillins Hives  . Wellbutrin [Bupropion Hcl] Hives   No current outpatient prescriptions on file prior to visit.   No current facility-administered medications on file prior to visit.   Past Medical History  Diagnosis Date  . Allergic rhinitis   . Herpes zoster without mention of complication   . Dysfunction of eustachian tube   . Obesity   . Medical history non-contributory   . Endometriosis   . IBS (irritable bowel syndrome)   . Kidney calculi   . Allergy     Review of Systems    See HPI  Objective:    BP 122/80 mmHg  Pulse 81  Temp(Src) 98.6 F (37 C) (Oral)  Resp 18  Ht 5\' 4"  (1.626 m)  Wt 190 lb (86.183 kg)  BMI 32.60 kg/m2  SpO2 98% Nursing note and vital signs reviewed.  Physical Exam  Constitutional: She is oriented to person, place, and time. She appears well-developed and well-nourished. No distress.  HENT:  Right Ear: Hearing,  tympanic membrane, external ear and ear canal normal.  Left Ear: Hearing, tympanic membrane, external ear and ear canal normal.  Nose: Right sinus exhibits maxillary sinus tenderness. Right sinus exhibits no frontal sinus tenderness. Left sinus exhibits maxillary sinus tenderness. Left sinus exhibits no frontal sinus tenderness.  Mouth/Throat: Uvula is midline, oropharynx is clear and moist and mucous membranes are normal.  Cardiovascular: Normal rate, regular rhythm, normal heart sounds and intact distal pulses.   Pulmonary/Chest: Effort normal and breath sounds normal.  Lymphadenopathy:    She has no cervical adenopathy.  Neurological: She is alert and oriented to person, place, and time.  Skin: Skin is warm and dry.  Psychiatric: She has a normal mood and affect. Her behavior is normal. Judgment and thought content normal.       Assessment & Plan:

## 2014-04-06 NOTE — Progress Notes (Signed)
Pre visit review using our clinic review tool, if applicable. No additional management support is needed unless otherwise documented below in the visit note. 

## 2014-04-06 NOTE — Patient Instructions (Signed)
Thank you for choosing [facility].  Summary/Instructions:  Your prescription(s) have been submitted to your pharmacy. Please take as directed and contact our office if you believe you are having problem(s) with the medication(s).  If your symptoms worsen or fail to improve, please contact our office for further instruction, or in case of emergency go directly to the emergency room at the closest medical facility.   Homatropine; Hydrocodone oral syrup What is this medicine? HYDROCODONE (hye droe KOE done) is used to help relieve cough. This medicine may be used for other purposes; ask your health care provider or pharmacist if you have questions. COMMON BRAND NAME(S): Hycodan, Hydromet, Hydropane, Mycodone What should I tell my health care provider before I take this medicine? They need to know if you have any of these conditions: -brain tumor -drug abuse or addiction -head injury -heart disease -if you frequently drink alcohol-containing drinks -kidney disease -liver disease -lung disease, asthma, or breathing problems -mental problems -an allergic reaction to hydrocodone, other opioid analgesics, other medicines, foods, dyes, or preservatives -pregnant or trying to get pregnant -breast-feeding How should I use this medicine? Take this medicine by mouth with a full glass of water. Use a specially marked spoon or dropper to measure your dose. Ask your pharmacist if you do not have one. Do not use a household spoon. Follow the directions on the prescription label. Take your medicine at regular intervals. Do not take your medicine more often than directed. Talk to your pediatrician regarding the use of this medicine in children. This medicine is not approved for use in children less than 3 years old. Patients over 63 years old may have a stronger reaction and need a smaller dose. Overdosage: If you think you have taken too much of this medicine contact a poison control center or  emergency room at once. NOTE: This medicine is only for you. Do not share this medicine with others. What if I miss a dose? If you miss a dose, take it as soon as you can. If it is almost time for your next dose, take only that dose. Do not take double or extra doses. What may interact with this medicine? -alcohol -antihistamines -MAOIs like Carbex, Eldepryl, Marplan, Nardil, and Parnate -medicines for depression, anxiety, or psychotic disturbances -medicines for sleep -muscle relaxants -naltrexone -narcotic medicines (opiates) for pain -tramadol -tricyclic antidepressants like amitriptyline, clomipramine, desipramine, doxepin, imipramine, nortriptyline, and protriptyline This list may not describe all possible interactions. Give your health care provider a list of all the medicines, herbs, non-prescription drugs, or dietary supplements you use. Also tell them if you smoke, drink alcohol, or use illegal drugs. Some items may interact with your medicine. What should I watch for while using this medicine? You may develop tolerance to this medicine if you take it for a long time. Tolerance means that you will get less cough relief with time. Tell your doctor or health care professional if you symptoms do not improve or if they get worse. Do not suddenly stop taking your medicine because you may develop a severe reaction. Your body becomes used to the medicine. This does NOT mean you are addicted. Addiction is a behavior related to getting and using a drug for a non-medical reason. If your doctor wants you to stop the medicine, the dose will be slowly lowered over time to avoid any side effects. You may get drowsy or dizzy. Do not drive, use machinery, or do anything that needs mental alertness until you know how  this medicine affects you. Do not stand or sit up quickly, especially if you are an older patient. This reduces the risk of dizzy or fainting spells. Alcohol may interfere with the effect of  this medicine. Avoid alcoholic drinks. This medicine will cause constipation. Try to have a bowel movement at least every 2 to 3 days. If you do not have a bowel movement for 3 days, call your doctor or health care professional. What side effects may I notice from receiving this medicine? Side effects that you should report to your doctor or health care professional as soon as possible: -allergic reactions like skin rash, itching or hives, swelling of the face, lips, or tongue -breathing difficulties, wheezing -confusion -light headedness or fainting spells Side effects that usually do not require medical attention (report to your doctor or health care professional if they continue or are bothersome): -dizziness -drowsiness -itching -nausea -vomiting This list may not describe all possible side effects. Call your doctor for medical advice about side effects. You may report side effects to FDA at 1-800-FDA-1088. Where should I keep my medicine? Keep out of the reach of children. This medicine can be abused. Keep your medicine in a safe place to protect it from theft. Do not share this medicine with anyone. Selling or giving away this medicine is dangerous and against the law. Store at room temperature between 15 and 30 degrees C (59 and 86 degrees F). Protect from light. Throw away any unused medicine after the expiration date. Discard unused medicine and used packaging carefully. Pets and children can be harmed if they find used or lost packages. NOTE: This sheet is a summary. It may not cover all possible information. If you have questions about this medicine, talk to your doctor, pharmacist, or health care provider.  2015, Elsevier/Gold Standard. (2011-01-15 10:04:07)

## 2014-07-19 ENCOUNTER — Telehealth: Payer: Self-pay | Admitting: Family

## 2014-07-19 NOTE — Telephone Encounter (Signed)
Patient Name: Susan Roth DOB: 14-Nov-1975 Initial Comment [name] states she is having shortness of breath on the right side. Nurse Assessment Nurse: Susan Sa, RN, Susan Roth Date/Time (Eastern Time): 07/19/2014 2:40:50 PM Confirm and document reason for call. If symptomatic, describe symptoms. ---[name] states she developed shortness of breath and wheezing on her right side about 2 weeks ago. No injury in the past 3 days. No fever. Has the patient traveled out of the country within the last 30 days? ---No Does the patient require triage? ---Yes Related visit to physician within the last 2 weeks? ---No Does the PT have any chronic conditions? (i.e. diabetes, asthma, etc.) ---Yes List chronic conditions. ---Allergies, Plantar Fasciatis Did the patient indicate they were pregnant? ---No Guidelines Guideline Title Affirmed Question Affirmed Notes Breathing Difficulty [1] MODERATE difficulty breathing (e.g., speaks in phrases, SOB even at rest, pulse 100-120) AND [2] NEW-onset or WORSE than normal Final Disposition User Go to ED Now Susan Sa, RN, Susan Roth plans to go to [facility] ER.

## 2014-07-20 ENCOUNTER — Other Ambulatory Visit (INDEPENDENT_AMBULATORY_CARE_PROVIDER_SITE_OTHER): Payer: Managed Care, Other (non HMO)

## 2014-07-20 ENCOUNTER — Encounter: Payer: Self-pay | Admitting: Family

## 2014-07-20 ENCOUNTER — Telehealth: Payer: Self-pay | Admitting: *Deleted

## 2014-07-20 ENCOUNTER — Ambulatory Visit (INDEPENDENT_AMBULATORY_CARE_PROVIDER_SITE_OTHER): Payer: Managed Care, Other (non HMO) | Admitting: Family

## 2014-07-20 VITALS — BP 128/82 | HR 84 | Temp 97.9°F | Resp 18 | Ht 64.0 in | Wt 191.0 lb

## 2014-07-20 DIAGNOSIS — R1011 Right upper quadrant pain: Secondary | ICD-10-CM | POA: Insufficient documentation

## 2014-07-20 DIAGNOSIS — R1010 Upper abdominal pain, unspecified: Secondary | ICD-10-CM

## 2014-07-20 DIAGNOSIS — G8929 Other chronic pain: Secondary | ICD-10-CM

## 2014-07-20 LAB — URINALYSIS
Bilirubin Urine: NEGATIVE
Hgb urine dipstick: NEGATIVE
KETONES UR: NEGATIVE
Leukocytes, UA: NEGATIVE
Nitrite: NEGATIVE
PH: 7 (ref 5.0–8.0)
Specific Gravity, Urine: 1.02 (ref 1.000–1.030)
Total Protein, Urine: NEGATIVE
URINE GLUCOSE: NEGATIVE
Urobilinogen, UA: 1 (ref 0.0–1.0)

## 2014-07-20 LAB — LIPASE: LIPASE: 15 U/L (ref 11.0–59.0)

## 2014-07-20 LAB — AMYLASE: Amylase: 24 U/L — ABNORMAL LOW (ref 27–131)

## 2014-07-20 LAB — H. PYLORI ANTIBODY, IGG: H PYLORI IGG: POSITIVE — AB

## 2014-07-20 MED ORDER — CLARITHROMYCIN 500 MG PO TABS: 500.0000 mg | ORAL_TABLET | Freq: Two times a day (BID) | ORAL | Status: DC

## 2014-07-20 MED ORDER — METRONIDAZOLE 500 MG PO TABS: 500.0000 mg | ORAL_TABLET | Freq: Two times a day (BID) | ORAL | Status: DC

## 2014-07-20 MED ORDER — OMEPRAZOLE 20 MG PO CPDR: 20.0000 mg | DELAYED_RELEASE_CAPSULE | Freq: Two times a day (BID) | ORAL | Status: DC

## 2014-07-20 NOTE — Patient Instructions (Signed)
Thank you for choosing [facility].  Summary/Instructions:  Please stop by the lab on the basement level of the building for your blood work. Your results will be released to [place] (or called to you) after review, usually within 72 hours after test completion. If any changes need to be made, you will be notified at that same time.  Referrals have been made during this visit. You should expect to hear back from our schedulers in about 7-10 days in regards to establishing an appointment with the specialists we discussed.   If your symptoms worsen or fail to improve, please contact our office for further instruction, or in case of emergency go directly to the emergency room at the closest medical facility.    Continue the probiotic and trial prilosec or other antacid medication.

## 2014-07-20 NOTE — Progress Notes (Signed)
Pre visit review using our clinic review tool, if applicable. No additional management support is needed unless otherwise documented below in the visit note. 

## 2014-07-20 NOTE — Progress Notes (Signed)
   Subjective:    Patient ID: Susan Roth, female    DOB: [birth year], 39 y.o.   MRN: [id number]  Chief Complaint  Patient presents with  . Shortness of Breath    x2 weeks, says she starts having SOB as the day goes on and has a weird feeling in her stomach like she can't catch her breath, she also states that she has been using the bathroom more frequently throughout the night, says she sweats easily and its getting bad, all symptoms are worse on right side    HPI:  Susan Roth is a 39 y.o. female who presents today for an acute visit.  This is a new problem. Associated symptoms of shortness of breath has been going on for about 2 weeks. Timing of the symptoms are worse as the day goes on. Indicates that she feels like she has pressure in the right upper quadrant. Intensity of the pain is 0/10. Indicates that eating has an effect on the feeling.    Allergies  Allergen Reactions  . Amoxicillin Hives  . Penicillins Hives  . Wellbutrin [Bupropion Hcl] Hives    No current outpatient prescriptions on file prior to visit.   No current facility-administered medications on file prior to visit.   Past Medical History  Diagnosis Date  . Allergic rhinitis   . Herpes zoster without mention of complication   . Dysfunction of eustachian tube   . Obesity   . Medical history non-contributory   . Endometriosis   . IBS (irritable bowel syndrome)   . Kidney calculi   . Allergy     Review of Systems  Constitutional: Negative for fever and chills.  Gastrointestinal: Positive for abdominal pain (Pressure) and constipation. Negative for diarrhea.  Genitourinary: Positive for urgency and frequency. Negative for dysuria and difficulty urinating.  Musculoskeletal: Positive for back pain (occasional).      Objective:    BP 128/82 mmHg  Pulse 84  Temp(Src) 97.9 F (36.6 C) (Oral)  Resp 18  Ht 5\' 4"  (1.626 m)  Wt 191 lb (86.637 kg)  BMI 32.77 kg/m2  SpO2 96% Nursing note and  vital signs reviewed.  Physical Exam  Constitutional: She is oriented to person, place, and time. She appears well-developed and well-nourished. No distress.  Cardiovascular: Normal rate, regular rhythm, normal heart sounds and intact distal pulses.   Pulmonary/Chest: Effort normal and breath sounds normal.  Abdominal: Soft. Normal appearance and bowel sounds are normal. She exhibits no distension. There is no hepatosplenomegaly. There is tenderness in the right upper quadrant. There is no rigidity, no rebound, no guarding, no CVA tenderness, no tenderness at McBurney's point and negative Murphy's sign.  Neurological: She is alert and oriented to person, place, and time.  Skin: Skin is warm and dry.  Psychiatric: She has a normal mood and affect. Her behavior is normal. Judgment and thought content normal.       Assessment & Plan:

## 2014-07-20 NOTE — Assessment & Plan Note (Signed)
Non-distinct abdominal discomfort. Obtain Amylase, H. Pylorii, lipase and UA. Obtain US abdomen. Most likely related to IBS, but cannot rule out underlying pathology. Continue OTC probiotic and consider trial of OTC antacid. If symptoms worsen patient instructed to go to the Emergency Room. Follow up if symptoms worsen or fail to improve.

## 2014-07-20 NOTE — Telephone Encounter (Signed)
[facility] - Client [facility] Patient Name: Susan Roth Gender: Female DOB: 1975-12-25 Age: 39 Y 71 M 10 D Return Phone Number: [phone] (Primary) Address: City/State/Zip: [place] Client [facility] - Client Client Site [facility] - Day Contact Type Call Call Type Triage / Clinical [name] Name [place] Relationship To Patient Self Appointment Disposition EMR Appointment Attempted - Not Scheduled Return Phone Number [phone] (Primary) Chief Complaint BREATHING - shortness of breath or sounds breathless Initial Comment [name] states she is having shortness of breath on the right side. PreDisposition Did not know what to do Info pasted into Epic Yes Nurse Assessment Nurse: [name], RN, [name] Date/Time (Eastern Time): 07/19/2014 2:40:50 PM Confirm and document reason for call. If symptomatic, describe symptoms. ---[name] states she developed shortness of breath and wheezing on her right side about 2 weeks ago. No injury in the past 3 days. No fever. Has the patient traveled out of the country within the last 30 days? ---No Does the patient require triage? ---Yes Related visit to physician within the last 2 weeks? ---No Does the PT have any chronic conditions? (i.e. diabetes, asthma, etc.) ---Yes List chronic conditions. ---Allergies, Plantar Fasciatis Did the patient indicate they were pregnant? ---No Guidelines Guideline Title Affirmed Question Affirmed Notes Nurse Date/Time (Eastern Time) Breathing Difficulty [1] MODERATE difficulty breathing (e.g., speaks in phrases, SOB even at rest, pulse 100-120) AND [2] NEWonset or WORSE than normal [name], RN, [name] 07/19/2014 2:44:18 PM Disp. Time [name] Time) Disposition Final User 07/19/2014 2:37:37 PM Send to Urgent Queue [name] PLEASE NOTE: All timestamps contained within this report are represented as [place] Standard  Time. CONFIDENTIALTY NOTICE: This fax transmission is intended only for the addressee. It contains information that is legally privileged, confidential or otherwise protected from use or disclosure. If you are not the intended recipient, you are strictly prohibited from reviewing, disclosing, copying using or disseminating any of this information or taking any action in reliance on or regarding this information. If you have received this fax in error, please notify us immediately by telephone so that we can arrange for its return to [place]. Phone: [phone], Toll-Free: [phone], Fax: [phone] Page: 2 of 2 Call Id: [id number] 07/19/2014 2:46:50 PM Go to ED Now Yes [name], RN, [name] Understands: Yes Disagree/Comply: Comply Care Advice Given Per Guideline GO TO ED NOW: You need to be seen in the Emergency Department. Go to the ER at ___________ [place] now. Drive carefully. NOTE TO TRIAGER - DRIVING: * Another adult should drive. * If immediate transportation is not available via car or taxi, then the patient should be instructed to call [id number]. BRING MEDICINES: * Please bring a list of your current medicines when you go to the Emergency Department (ER). * It is also a good idea to bring the pill bottles too. This will help the doctor to make certain you are taking the right medicines and the right dose. CALL EMS 911 IF: you become worse. CARE ADVICE given per Breathing Difficulty (Adult) guideline. After Care Instructions Given Call Event Type User Date / Time Description Referrals [facility] - ED

## 2014-07-27 ENCOUNTER — Ambulatory Visit (HOSPITAL_BASED_OUTPATIENT_CLINIC_OR_DEPARTMENT_OTHER): Payer: Managed Care, Other (non HMO)

## 2014-08-16 ENCOUNTER — Other Ambulatory Visit: Payer: Self-pay

## 2014-08-16 DIAGNOSIS — R234 Changes in skin texture: Secondary | ICD-10-CM

## 2014-12-06 ENCOUNTER — Encounter: Payer: Self-pay | Admitting: Family

## 2014-12-06 ENCOUNTER — Ambulatory Visit (INDEPENDENT_AMBULATORY_CARE_PROVIDER_SITE_OTHER): Payer: Managed Care, Other (non HMO) | Admitting: Family

## 2014-12-06 VITALS — BP 104/70 | HR 67 | Temp 98.2°F | Resp 18 | Ht 64.0 in | Wt 177.0 lb

## 2014-12-06 DIAGNOSIS — M5441 Lumbago with sciatica, right side: Secondary | ICD-10-CM | POA: Diagnosis not present

## 2014-12-06 DIAGNOSIS — M545 Low back pain, unspecified: Secondary | ICD-10-CM | POA: Insufficient documentation

## 2014-12-06 LAB — POCT URINALYSIS DIPSTICK
BILIRUBIN UA: NEGATIVE
GLUCOSE UA: NEGATIVE
Ketones, UA: NEGATIVE
LEUKOCYTES UA: NEGATIVE
Nitrite, UA: NEGATIVE
PH UA: 6.5
PROTEIN UA: NEGATIVE
RBC UA: NEGATIVE
Spec Grav, UA: 1.015
Urobilinogen, UA: NEGATIVE

## 2014-12-06 MED ORDER — PREDNISONE 10 MG PO TABS: ORAL_TABLET | ORAL | Status: DC

## 2014-12-06 NOTE — Patient Instructions (Addendum)
Thank you for choosing [facility].  Summary/Instructions:  Your prescription(s) have been submitted to your pharmacy or been printed and provided for you. Please take as directed and contact our office if you believe you are having problem(s) with the medication(s) or have any questions.  If your symptoms worsen or fail to improve, please contact our office for further instruction, or in case of emergency go directly to the emergency room at the closest medical facility.    6 Day Prednisone Taper Instructions:   Day 1: Two tablets before breakfast, one after lunch, one after dinner, and two at bedtime.  Day 2: One tablet before breakfast, one after lunch, one after dinner, and two at bedtime Day 3: One tablet before breakfast, one after lunch, one after dinner, and one at bedtime Day 4: One tablet before breakfast, one after lunch, and one at bedtime Day 5: One tablet before breakfast and one at bedtime Day 6: One tablet before breakfast  Sciatica Sciatica is pain, weakness, numbness, or tingling along the path of the sciatic nerve. The nerve starts in the lower back and runs down the back of each leg. The nerve controls the muscles in the lower leg and in the back of the knee, while also providing sensation to the back of the thigh, lower leg, and the sole of your foot. Sciatica is a symptom of another medical condition. For instance, nerve damage or certain conditions, such as a herniated disk or bone spur on the spine, pinch or put pressure on the sciatic nerve. This causes the pain, weakness, or other sensations normally associated with sciatica. Generally, sciatica only affects one side of the body. CAUSES   Herniated or slipped disc.  Degenerative disk disease.  A pain disorder involving the narrow muscle in the buttocks (piriformis syndrome).  Pelvic injury or fracture.  Pregnancy.  Tumor (rare). SYMPTOMS  Symptoms can vary from mild to very severe. The symptoms  usually travel from the low back to the buttocks and down the back of the leg. Symptoms can include:  Mild tingling or dull aches in the lower back, leg, or hip.  Numbness in the back of the calf or sole of the foot.  Burning sensations in the lower back, leg, or hip.  Sharp pains in the lower back, leg, or hip.  Leg weakness.  Severe back pain inhibiting movement. These symptoms may get worse with coughing, sneezing, laughing, or prolonged sitting or standing. Also, being overweight may worsen symptoms. DIAGNOSIS  Your caregiver will perform a physical exam to look for common symptoms of sciatica. He or she may ask you to do certain movements or activities that would trigger sciatic nerve pain. Other tests may be performed to find the cause of the sciatica. These may include:  Blood tests.  X-rays.  Imaging tests, such as an MRI or CT scan. TREATMENT  Treatment is directed at the cause of the sciatic pain. Sometimes, treatment is not necessary and the pain and discomfort goes away on its own. If treatment is needed, your caregiver may suggest:  Over-the-counter medicines to relieve pain.  Prescription medicines, such as anti-inflammatory medicine, muscle relaxants, or narcotics.  Applying heat or ice to the painful area.  Steroid injections to lessen pain, irritation, and inflammation around the nerve.  Reducing activity during periods of pain.  Exercising and stretching to strengthen your abdomen and improve flexibility of your spine. Your caregiver may suggest losing weight if the extra weight makes the back pain worse.  Physical therapy.  Surgery to eliminate what is pressing or pinching the nerve, such as a bone spur or part of a herniated disk. HOME CARE INSTRUCTIONS   Only take over-the-counter or prescription medicines for pain or discomfort as directed by your caregiver.  Apply ice to the affected area for 20 minutes, 3-4 times a day for the first 48-72 hours.  Then try heat in the same way.  Exercise, stretch, or perform your usual activities if these do not aggravate your pain.  Attend physical therapy sessions as directed by your caregiver.  Keep all follow-up appointments as directed by your caregiver.  Do not wear high heels or shoes that do not provide proper support.  Check your mattress to see if it is too soft. A firm mattress may lessen your pain and discomfort. SEEK IMMEDIATE MEDICAL CARE IF:   You lose control of your bowel or bladder (incontinence).  You have increasing weakness in the lower back, pelvis, buttocks, or legs.  You have redness or swelling of your back.  You have a burning sensation when you urinate.  You have pain that gets worse when you lie down or awakens you at night.  Your pain is worse than you have experienced in the past.  Your pain is lasting longer than 4 weeks.  You are suddenly losing weight without reason. MAKE SURE YOU:  Understand these instructions.  Will watch your condition.  Will get help right away if you are not doing well or get worse. Document Released: 05/01/2001 Document Revised: 11/06/2011 Document Reviewed: 09/16/2011 [facility] Patient Information 2015 [place], [place]. This information is not intended to replace advice given to you by your health care provider. Make sure you discuss any questions you have with your health care provider.

## 2014-12-06 NOTE — Progress Notes (Signed)
Subjective:    Patient ID: Susan Roth, female    DOB: 1975-09-19, 38 y.o.   MRN: [id number]  Chief Complaint  Patient presents with  . Back Pain    x2 weeks, has low back pain that is more towards her buttocks, has had this happen before, hard to walk, stand, or lay down, when it hurts like this its hard for her to hold her urine and BM    HPI:  Susan Roth is a 39 y.o. female with a PMH of allergic rhinitis, irritable bowel syndrome, dysmenorrhea, anxiety and depression, and obesity who presents today for an acute office visit.   Associated symptom of pain located in her lower back and right buttocks has been going on for approximately 2 weeks and started about 1 year ago. Describes the pain as achy with occasional sharpness and severity of 5-6/10. Indicates that when it flares up, she is unable to hold her urine or bowel when the pain increases, but when the pain decreases the function returns to normal. Modifying factors include ibuprofen. Notes the pain when she is walking, standing and laying down. Notes that she stays constipated and it increases her back pain. Notes that occasionally the pain in her back is improved with a bowel movement. Constipation is currently controlled with linzess.   Allergies  Allergen Reactions  . Amoxicillin Hives  . Penicillins Hives  . Wellbutrin [Bupropion Hcl] Hives    No current outpatient prescriptions on file prior to visit.   No current facility-administered medications on file prior to visit.    Past Medical History  Diagnosis Date  . Allergic rhinitis   . Herpes zoster without mention of complication   . Dysfunction of eustachian tube   . Obesity   . Medical history non-contributory   . Endometriosis   . IBS (irritable bowel syndrome)   . Kidney calculi   . Allergy       Review of Systems  Constitutional: Negative for fever and chills.  Genitourinary: Negative for dysuria, urgency, frequency and hematuria.    Musculoskeletal: Positive for back pain.  Neurological: Negative for headaches.      Objective:    BP 104/70 mmHg  Pulse 67  Temp(Src) 98.2 F (36.8 C) (Oral)  Resp 18  Ht 5\' 4"  (1.626 m)  Wt 177 lb (80.287 kg)  BMI 30.37 kg/m2  SpO2 97% Nursing note and vital signs reviewed.  Physical Exam  Constitutional: She is oriented to person, place, and time. She appears well-developed and well-nourished. No distress.  Cardiovascular: Normal rate, regular rhythm, normal heart sounds and intact distal pulses.   Pulmonary/Chest: Effort normal and breath sounds normal.  Musculoskeletal:  No obvious deformity, discoloration, or edema of the lumbar spine noted. Mild tenderness elicited along the right lumbar paraspinal musculature and SI joint. Range of motion is significantly limited in flexion and normal within other motions. Straight leg raise is negative. Distal pulses, sensation, and reflexes are intact and appropriate.  Neurological: She is alert and oriented to person, place, and time.  Skin: Skin is warm and dry.  Psychiatric: She has a normal mood and affect. Her behavior is normal. Judgment and thought content normal.       Assessment & Plan:   Problem List Items Addressed This Visit      Other   Low back pain - Primary    Right-sided low back pain most likely related to sciatica and SI joint dysfunction, however cannot rule out lumbar muscle  strain. In office urinalysis negative for hematuria, nitrites, and leukocytes indicating absence of urinary tract infection. Start prednisone taper. Continue over-the-counter anti-inflammatories as needed for pain. Muscle strengthening and stretching exercises provided for home therapy purposes. Continue nonpharmacological treatments including ice/heat and stretching. Denies bowel or bladder incontinence. Follow-up at completion of prednisone taper if symptoms worsen or fail to improve.      Relevant Medications   predniSONE (DELTASONE) 10  MG tablet   Other Relevant Orders   POCT urinalysis dipstick (Completed)

## 2014-12-06 NOTE — Progress Notes (Signed)
Pre visit review using our clinic review tool, if applicable. No additional management support is needed unless otherwise documented below in the visit note. 

## 2014-12-06 NOTE — Assessment & Plan Note (Addendum)
Right-sided low back pain most likely related to sciatica and SI joint dysfunction, however cannot rule out lumbar muscle strain. In office urinalysis negative for hematuria, nitrites, and leukocytes indicating absence of urinary tract infection. Start prednisone taper. Continue over-the-counter anti-inflammatories as needed for pain. Muscle strengthening and stretching exercises provided for home therapy purposes. Continue nonpharmacological treatments including ice/heat and stretching. Denies bowel or bladder incontinence. Follow-up at completion of prednisone taper if symptoms worsen or fail to improve.

## 2014-12-26 ENCOUNTER — Emergency Department (HOSPITAL_BASED_OUTPATIENT_CLINIC_OR_DEPARTMENT_OTHER)
Admit: 2014-12-26 | Discharge: 2014-12-26 | Disposition: A | Discharge status: Home / Self Care | Payer: Managed Care, Other (non HMO) | Attending: Emergency Medicine | Admitting: Emergency Medicine

## 2014-12-26 ENCOUNTER — Emergency Department (HOSPITAL_COMMUNITY): Payer: Managed Care, Other (non HMO)

## 2014-12-26 ENCOUNTER — Encounter (HOSPITAL_COMMUNITY): Payer: Self-pay | Admitting: Emergency Medicine

## 2014-12-26 ENCOUNTER — Emergency Department (HOSPITAL_COMMUNITY)
Admission: EM | Admit: 2014-12-26 | Discharge: 2014-12-26 | Disposition: A | Discharge status: Home / Self Care | Payer: Managed Care, Other (non HMO) | Attending: Emergency Medicine | Admitting: Emergency Medicine

## 2014-12-26 DIAGNOSIS — M7989 Other specified soft tissue disorders: Secondary | ICD-10-CM | POA: Diagnosis not present

## 2014-12-26 DIAGNOSIS — L03116 Cellulitis of left lower limb: Secondary | ICD-10-CM | POA: Insufficient documentation

## 2014-12-26 DIAGNOSIS — M25475 Effusion, left foot: Secondary | ICD-10-CM

## 2014-12-26 DIAGNOSIS — Z880 Allergy status to penicillin: Secondary | ICD-10-CM | POA: Diagnosis not present

## 2014-12-26 DIAGNOSIS — E669 Obesity, unspecified: Secondary | ICD-10-CM | POA: Insufficient documentation

## 2014-12-26 DIAGNOSIS — R079 Chest pain, unspecified: Secondary | ICD-10-CM | POA: Diagnosis not present

## 2014-12-26 DIAGNOSIS — Z8719 Personal history of other diseases of the digestive system: Secondary | ICD-10-CM | POA: Insufficient documentation

## 2014-12-26 DIAGNOSIS — Z8619 Personal history of other infectious and parasitic diseases: Secondary | ICD-10-CM | POA: Insufficient documentation

## 2014-12-26 DIAGNOSIS — Z8669 Personal history of other diseases of the nervous system and sense organs: Secondary | ICD-10-CM | POA: Insufficient documentation

## 2014-12-26 DIAGNOSIS — R0602 Shortness of breath: Secondary | ICD-10-CM | POA: Insufficient documentation

## 2014-12-26 DIAGNOSIS — Z8742 Personal history of other diseases of the female genital tract: Secondary | ICD-10-CM | POA: Insufficient documentation

## 2014-12-26 DIAGNOSIS — Z87442 Personal history of urinary calculi: Secondary | ICD-10-CM | POA: Insufficient documentation

## 2014-12-26 DIAGNOSIS — R2242 Localized swelling, mass and lump, left lower limb: Secondary | ICD-10-CM | POA: Diagnosis present

## 2014-12-26 MED ORDER — CEPHALEXIN 500 MG PO CAPS: 500.0000 mg | ORAL_CAPSULE | Freq: Three times a day (TID) | ORAL | Status: DC

## 2014-12-26 MED ORDER — NAPROXEN 500 MG PO TABS
500.0000 mg | ORAL_TABLET | Freq: Two times a day (BID) | ORAL | Status: DC
Start: 2014-12-26 — End: 2015-05-03

## 2014-12-26 NOTE — Discharge Instructions (Signed)
Naprosyn for pain. Keflex for possible infection. Keep elevated. Ice. Follow up with PCP as needed. Return if worsening.   Cellulitis Cellulitis is an infection of the skin and the tissue beneath it. The infected area is usually red and tender. Cellulitis occurs most often in the arms and lower legs.  CAUSES  Cellulitis is caused by bacteria that enter the skin through cracks or cuts in the skin. The most common types of bacteria that cause cellulitis are staphylococci and streptococci. SIGNS AND SYMPTOMS   Redness and warmth.  Swelling.  Tenderness or pain.  Fever. DIAGNOSIS  Your health care provider can usually determine what is wrong based on a physical exam. Blood tests may also be done. TREATMENT  Treatment usually involves taking an antibiotic medicine. HOME CARE INSTRUCTIONS   Take your antibiotic medicine as directed by your health care provider. Finish the antibiotic even if you start to feel better.  Keep the infected arm or leg elevated to reduce swelling.  Apply a warm cloth to the affected area up to 4 times per day to relieve pain.  Take medicines only as directed by your health care provider.  Keep all follow-up visits as directed by your health care provider. SEEK MEDICAL CARE IF:   You notice red streaks coming from the infected area.  Your red area gets larger or turns dark in color.  Your bone or joint underneath the infected area becomes painful after the skin has healed.  Your infection returns in the same area or another area.  You notice a swollen bump in the infected area.  You develop new symptoms.  You have a fever. SEEK IMMEDIATE MEDICAL CARE IF:   You feel very sleepy.  You develop vomiting or diarrhea.  You have a general ill feeling (malaise) with muscle aches and pains. MAKE SURE YOU:   Understand these instructions.  Will watch your condition.  Will get help right away if you are not doing well or get worse. Document  Released: 02/14/2005 Document Revised: 09/21/2013 Document Reviewed: 07/23/2011 [facility] Patient Information 2015 [place], [place]. This information is not intended to replace advice given to you by your health care provider. Make sure you discuss any questions you have with your health care provider.

## 2014-12-26 NOTE — ED Notes (Signed)
Per pt she woke up with painful swollen left foot on Thursday. Pt denies fall or injury to foot. Pt co chest pain with activity like walking up stairs, yet denies chest pain at this time. Also reports family history of CHF. Pt denies Hx of HTN.

## 2014-12-26 NOTE — Progress Notes (Signed)
*  Preliminary Results* Left lower extremity venous duplex completed. Left lower extremity is negative for deep vein thrombosis. There is no evidence of left Baker's cyst.  12/26/2014 3:38 PM  Maudry Mayhew, RVT, RDCS, RDMS

## 2014-12-26 NOTE — ED Provider Notes (Signed)
CSN: [id number]     Arrival date & time 12/26/14  1259 History  This chart was scribed for Jeannett Senior, PA-C, working with Fredia Sorrow, MD by Starleen Arms, ED Scribe. This patient was seen in room WTR6/WTR6 and the patient's care was started at 1:43 PM.   Chief Complaint  Patient presents with  . Foot Swelling    left   The history is provided by the patient. No language interpreter was used.   HPI Comments: Susan Roth is a 39 y.o. female who presents to the Emergency Department complaining of left foot swelling onset 3 days ago without injury.  She describes the complaint as sore and tight.  She also some chronic, mild SOB and CP with walking up stairs.  The CP is not worsened by laying down or deep inspiration. States she has been seen for this in the past with no diagnosis made.  No treatments, including elevation have been tried.  There has been no recent travel or surgical procedures and the patient does not use birth control.  Patient completed a seven day course of prednisone approximately 2 weeks ago for back pain.     Past Medical History  Diagnosis Date  . Allergic rhinitis   . Herpes zoster without mention of complication   . Dysfunction of eustachian tube   . Obesity   . Medical history non-contributory   . Endometriosis   . IBS (irritable bowel syndrome)   . Kidney calculi   . Allergy    Past Surgical History  Procedure Laterality Date  . Wisdom tooth extraction    . Tonsillectomy      as an adult - 2011  . Cesarean section  1998    x1  . Laparoscopic tubal ligation N/A 08/04/2012    Procedure: LAPAROSCOPIC TUBAL LIGATION;  Surgeon: Emily Filbert, MD;  Location: [facility];  Service: Gynecology;  Laterality: N/A;  Operative lsc for right salpingectomy & Mirena insertion  . Bilateral salpingectomy Right 08/04/2012    Procedure: BILATERAL SALPINGECTOMY;  Surgeon: Emily Filbert, MD;  Location: [facility];  Service: Gynecology;  Laterality: Right;  . Colonoscopy      Family History  Problem Relation Age of Onset  . Hyperlipidemia Mother   . Colon polyps Mother   . Nephrolithiasis Mother   . Diverticulosis Mother   . Nephrolithiasis Father   . Depression Father     bipolar  . Colon cancer Father 53  . Asthma Sister   . Allergies Sister   . Allergies Brother   . Clotting disorder Maternal Uncle   . Ovarian cancer Maternal Grandmother   . Colon cancer Paternal Aunt   . Diabetes Paternal Aunt   . Asthma Sister   . Colon cancer Paternal Aunt 33  . Colon cancer Paternal Uncle 5    x 2 uncles  . Colon polyps Sister     paternal half sister; Lynch syndrome  . Colon cancer Other     double great uncle from both mom and dads side   History  Substance Use Topics  . Smoking status: Never Smoker   . Smokeless tobacco: Never Used     Comment: positive hx of passive tobacco smoke exposure  . Alcohol Use: 0.6 oz/week    1 Glasses of wine per week     Comment: occasionally   OB History    Gravida Para Term Preterm AB TAB SAB Ectopic Multiple Living   1 1 0 1 0 0 0  0 0 1     Review of Systems  Constitutional: Negative for fever and chills.  Respiratory: Positive for shortness of breath.   Cardiovascular: Positive for chest pain and leg swelling.  Musculoskeletal: Positive for joint swelling and arthralgias.      Allergies  Amoxicillin; Penicillins; and Wellbutrin  Home Medications   Prior to Admission medications   Medication Sig Start Date End Date Taking? Authorizing Provider  phentermine 15 MG capsule Take 15 mg by mouth every morning.    Historical Provider, MD  predniSONE (DELTASONE) 10 MG tablet Take 6 tablets x 1 day, 5 tablets x 1 day, 4 tablets x 1 day, 3 tablets x 1 day, 2 tablets x 1 day, 1 tablet x 1 day 12/06/14   [place], FNP   There were no vitals taken for this visit. Physical Exam  Constitutional: She is oriented to person, place, and time. She appears well-developed and well-nourished. No distress.  HENT:   Head: Normocephalic and atraumatic.  Eyes: Conjunctivae and EOM are normal.  Neck: Neck supple. No tracheal deviation present.  Cardiovascular: Normal rate, regular rhythm and normal heart sounds.   Pulmonary/Chest: Effort normal and breath sounds normal. No respiratory distress. She has no wheezes. She has no rales.  Musculoskeletal: Normal range of motion.  Left foot and ankle swelling. Mild erythema to the dorsal foot. TTP. Mild tenderness to the left calf. Pain with dorsiflexion of the ankle. DP pulses intact and equal bilaterally.   Neurological: She is alert and oriented to person, place, and time.  Skin: Skin is warm and dry.  Psychiatric: She has a normal mood and affect. Her behavior is normal.  Nursing note and vitals reviewed.   ED Course  Procedures (including critical care time)  DIAGNOSTIC STUDIES: Oxygen Saturation is 97% on RA, normal by my interpretation.    COORDINATION OF CARE:  1:48 PM Discussed treatment plan with patient at bedside.  Patient acknowledges and agrees with plan.    Labs Review Labs Reviewed - No data to display  Imaging Review Dg Chest 2 View  12/26/2014   CLINICAL DATA:  Shortness of breath for 1 year  EXAM: CHEST  2 VIEW  COMPARISON:  None  FINDINGS: Normal heart size, mediastinal contours, and pulmonary vascularity.  Lungs clear.  No pneumothorax.  Bones unremarkable.  IMPRESSION: Normal exam.   Electronically Signed   By: Lavonia Dana M.D.   On: 12/26/2014 14:08     EKG Interpretation None      MDM   Final diagnoses:  Swelling of foot joint, left  Cellulitis of left foot   Pt with chronic SOB, only on exertion. None at present. VS normal. Left foot swelling, ddx includes dependent edema, cellulitis, DVT. Venous dopler obtained to ro dvt and it was negative. CXR obtained for chronic sob and is negative. Pt afebrile, non toxic appearing. Ambulates without difficulty. Will dc with keflex, cover for possible cellulitis. Follow up as  needed. Return precautions discussed.   Filed Vitals:   12/26/14 1312 12/26/14 1610  BP: 138/75 119/72  Pulse: 76   Temp: 97.8 F (36.6 C)   TempSrc: Oral   Resp: 16   SpO2: 100%    I personally performed the services described in this documentation, which was scribed in my presence. The recorded information has been reviewed and is accurate.   Jeannett Senior, PA-C 12/26/14 [place] Liu, MD 12/27/14 [id number]

## 2015-01-25 ENCOUNTER — Encounter: Payer: Self-pay | Admitting: Internal Medicine

## 2015-01-26 ENCOUNTER — Ambulatory Visit: Payer: Managed Care, Other (non HMO) | Admitting: Internal Medicine

## 2015-05-03 ENCOUNTER — Ambulatory Visit (INDEPENDENT_AMBULATORY_CARE_PROVIDER_SITE_OTHER): Payer: 59 | Admitting: Obstetrics & Gynecology

## 2015-05-03 ENCOUNTER — Encounter: Payer: Self-pay | Admitting: Obstetrics & Gynecology

## 2015-05-03 VITALS — Ht 64.0 in | Wt 175.0 lb

## 2015-05-03 DIAGNOSIS — Z124 Encounter for screening for malignant neoplasm of cervix: Secondary | ICD-10-CM

## 2015-05-03 DIAGNOSIS — Z113 Encounter for screening for infections with a predominantly sexual mode of transmission: Secondary | ICD-10-CM

## 2015-05-03 DIAGNOSIS — Z1151 Encounter for screening for human papillomavirus (HPV): Secondary | ICD-10-CM | POA: Diagnosis not present

## 2015-05-03 DIAGNOSIS — Z23 Encounter for immunization: Secondary | ICD-10-CM

## 2015-05-03 DIAGNOSIS — Z01419 Encounter for gynecological examination (general) (routine) without abnormal findings: Secondary | ICD-10-CM | POA: Diagnosis not present

## 2015-05-03 DIAGNOSIS — Z0000 Encounter for general adult medical examination without abnormal findings: Secondary | ICD-10-CM

## 2015-05-03 NOTE — Progress Notes (Signed)
Subjective:    Susan Roth is a 39 y.o. separated AAP1 (54 yo daughter)  female who presents for an annual exam. The patient has no complaints today except continued vaginal dryness. She has tried the astroglide. Using Replens with a little help. C/o graying of all of her hair. The patient is sexually active. GYN screening history: last pap: was normal. The patient wears seatbelts: yes. The patient participates in regular exercise: yes. Has the patient ever been transfused or tattooed?: yes. The patient reports that there is not domestic violence in her life.   Menstrual History: OB History    Gravida Para Term Preterm AB TAB SAB Ectopic Multiple Living   1 1 0 1 0 0 0 0 0 1       Menarche age: 70  Patient's last menstrual period was 04/21/2015.    The following portions of the patient's history were reviewed and updated as appropriate: allergies, current medications, past family history, past medical history, past social history, past surgical history and problem list.  Review of Systems Pertinent items are noted in HPI. Monogamous for about 4 months. Separated for 4 months. Works at [employer].   Objective:    Ht 5\' 4"  (1.626 m)  Wt 175 lb (79.379 kg)  BMI 30.02 kg/m2  LMP 04/21/2015  General Appearance:    Alert, cooperative, no distress, appears stated age  Head:    Normocephalic, without obvious abnormality, atraumatic  Eyes:    PERRL, conjunctiva/corneas clear, EOM's intact, fundi    benign, both eyes  Ears:    Normal TM's and external ear canals, both ears  Nose:   Nares normal, septum midline, mucosa normal, no drainage    or sinus tenderness  Throat:   Lips, mucosa, and tongue normal; teeth and gums normal  Neck:   Supple, symmetrical, trachea midline, no adenopathy;    thyroid:  no enlargement/tenderness/nodules; no carotid   bruit or JVD  Back:     Symmetric, no curvature, ROM normal, no CVA tenderness  Lungs:     Clear to auscultation bilaterally,  respirations unlabored  Chest Wall:    No tenderness or deformity   Heart:    Regular rate and rhythm, S1 and S2 normal, no murmur, rub   or gallop  Breast Exam:    No tenderness, masses, or nipple abnormality  Abdomen:     Soft, non-tender, bowel sounds active all four quadrants,    no masses, no organomegaly  Genitalia:    Normal female without lesion, discharge or tenderness     Extremities:   Extremities normal, atraumatic, no cyanosis or edema  Pulses:   2+ and symmetric all extremities  Skin:   Skin color, texture, turgor normal, no rashes or lesions  Lymph nodes:   Cervical, supraclavicular, and axillary nodes normal  Neurologic:   CNII-XII intact, normal strength, sensation and reflexes    throughout  .    Assessment:    Healthy female exam.    Plan:     Breast self exam technique reviewed and patient encouraged to perform self-exam monthly. Chlamydia specimen. GC specimen. Thin prep Pap smear. flu vaccine

## 2015-05-05 LAB — CYTOLOGY - PAP

## 2015-07-01 ENCOUNTER — Encounter: Payer: Self-pay | Admitting: Family

## 2015-07-01 ENCOUNTER — Ambulatory Visit (INDEPENDENT_AMBULATORY_CARE_PROVIDER_SITE_OTHER): Payer: 59 | Admitting: Family

## 2015-07-01 VITALS — BP 110/62 | HR 89 | Temp 97.6°F | Resp 16 | Ht 64.0 in | Wt 187.0 lb

## 2015-07-01 DIAGNOSIS — R238 Other skin changes: Secondary | ICD-10-CM | POA: Insufficient documentation

## 2015-07-01 DIAGNOSIS — L989 Disorder of the skin and subcutaneous tissue, unspecified: Secondary | ICD-10-CM | POA: Diagnosis not present

## 2015-07-01 NOTE — Assessment & Plan Note (Signed)
Scalp irritation with no evidence of dermatitis and most likely related to dryness. Recommend Selsun Blue or similar products for basic care care. Consider clobetasol foam if symptoms worsen or do not improve. Follow-up as needed.

## 2015-07-01 NOTE — Patient Instructions (Signed)
Thank you for choosing [facility].  Summary/Instructions:  If your symptoms worsen or fail to improve, please contact our office for further instruction, or in case of emergency go directly to the emergency room at the closest medical facility.   Please use selsun blue or Head and shoulders. Recommend Dove face wash.

## 2015-07-01 NOTE — Progress Notes (Signed)
Pre visit review using our clinic review tool, if applicable. No additional management support is needed unless otherwise documented below in the visit note. 

## 2015-07-01 NOTE — Progress Notes (Signed)
   Subjective:    Patient ID: Susan Roth, female    DOB: 1975-11-18, 40 y.o.   MRN: [id number]  Chief Complaint  Patient presents with  . Rash    states that she has broke out on her scalp with bumps that itch and irritate and now her forhead is breaking out, she had her hair done with braids and had hair extentions put in and does not know if she is allergic to the hair, this has been bothering her since november, hair has thinned out    HPI:  Susan Roth is a 40 y.o. female who  has a past medical history of Allergic rhinitis; Herpes zoster without mention of complication; Dysfunction of eustachian tube; Obesity; Endometriosis; IBS (irritable bowel syndrome); Kidney calculi; and Allergy. and presents today for an acute office visit.   This is a new problem. Associated symptom of a rash located on her scalp that is described as itchy and irritated and has worsened has been going on since around November when she had her hair bradied and hair extensions placed. She has since taken out the braids. Modifying factors include having her hair appointments. Believes it may have spread to her forehead.   Allergies  Allergen Reactions  . Amoxicillin Hives  . Penicillins Hives  . Wellbutrin [Bupropion Hcl] Hives    Outpatient Prescriptions Prior to Visit  Medication Sig Dispense Refill  . Probiotic Product (PROBIOTIC PO) Take 1 capsule by mouth daily.    . phentermine 15 MG capsule Take 15 mg by mouth every morning.     No facility-administered medications prior to visit.     Review of Systems  Constitutional: Negative for fever and chills.  Skin: Positive for rash.      Objective:    BP 110/62 mmHg  Pulse 89  Temp(Src) 97.6 F (36.4 C) (Oral)  Resp 16  Ht 5\' 4"  (1.626 m)  Wt 187 lb (84.823 kg)  BMI 32.08 kg/m2  SpO2 98% Nursing note and vital signs reviewed.  Physical Exam  Constitutional: She is oriented to person, place, and time. She appears well-developed and  well-nourished. No distress.  HENT:  No obvious rash or redness noted. Hair is fine and dry.  Cardiovascular: Normal rate, regular rhythm, normal heart sounds and intact distal pulses.   Pulmonary/Chest: Effort normal and breath sounds normal.  Neurological: She is alert and oriented to person, place, and time.  Skin: Skin is warm and dry.  Psychiatric: She has a normal mood and affect. Her behavior is normal. Judgment and thought content normal.       Assessment & Plan:   Problem List Items Addressed This Visit      Musculoskeletal and Integument   Scalp irritation - Primary    Scalp irritation with no evidence of dermatitis and most likely related to dryness. Recommend Selsun Blue or similar products for basic care care. Consider clobetasol foam if symptoms worsen or do not improve. Follow-up as needed.

## 2015-09-07 ENCOUNTER — Ambulatory Visit (INDEPENDENT_AMBULATORY_CARE_PROVIDER_SITE_OTHER): Payer: 59 | Admitting: Family

## 2015-09-07 ENCOUNTER — Encounter: Payer: Self-pay | Admitting: Family

## 2015-09-07 ENCOUNTER — Other Ambulatory Visit: Payer: 59

## 2015-09-07 VITALS — BP 120/84 | HR 79 | Temp 97.5°F | Resp 16 | Ht 64.0 in | Wt 189.0 lb

## 2015-09-07 DIAGNOSIS — B001 Herpesviral vesicular dermatitis: Secondary | ICD-10-CM | POA: Diagnosis not present

## 2015-09-07 DIAGNOSIS — R10A1 Flank pain, right side: Secondary | ICD-10-CM | POA: Insufficient documentation

## 2015-09-07 DIAGNOSIS — R109 Unspecified abdominal pain: Secondary | ICD-10-CM | POA: Insufficient documentation

## 2015-09-07 DIAGNOSIS — R8290 Unspecified abnormal findings in urine: Secondary | ICD-10-CM

## 2015-09-07 LAB — POCT URINALYSIS DIPSTICK
Bilirubin, UA: NEGATIVE
GLUCOSE UA: NEGATIVE
Ketones, UA: NEGATIVE
Leukocytes, UA: NEGATIVE
NITRITE UA: NEGATIVE
Protein, UA: NEGATIVE
RBC UA: NEGATIVE
Spec Grav, UA: 1.025
UROBILINOGEN UA: NEGATIVE
pH, UA: 7

## 2015-09-07 NOTE — Patient Instructions (Signed)
Thank you for choosing [facility].  Summary/Instructions:  If your symptoms worsen or fail to improve, please contact our office for further instruction, or in case of emergency go directly to the emergency room at the closest medical facility.    Cold Sore A cold sore (fever blister) is a skin infection caused by the herpes simplex virus (HSV-1). HSV-1 is closely related to the virus that causes genital herpes (HSV-2), but they are not the same even though both viruses can cause oral and genital infections. Cold sores are small, fluid-filled sores inside of the mouth or on the lips, gums, nose, chin, cheeks, or fingers.  The herpes simplex virus can be easily passed (contagious) to other people through close personal contact, such as kissing or sharing personal items. The virus can also spread to other parts of the body, such as the eyes or genitals. Cold sores are contagious until the sores crust over completely. They often heal within 2 weeks.  Once a person is infected, the herpes simplex virus remains permanently in the body. Therefore, there is no cure for cold sores, and they often recur when a person is tired, stressed, sick, or gets too much sun. Additional factors that can cause a recurrence include hormone changes in menstruation or pregnancy, certain drugs, and cold weather.  CAUSES  Cold sores are caused by the herpes simplex virus. The virus is spread from person to person through close contact, such as through kissing, touching the affected area, or sharing personal items such as lip balm, razors, or eating utensils.  SYMPTOMS  The first infection may not cause symptoms. If symptoms develop, the symptoms often go through different stages. Here is how a cold sore develops:   Tingling, itching, or burning is felt 1-2 days before the outbreak.   Fluid-filled blisters appear on the lips, inside the mouth, nose, or on the cheeks.   The blisters start to ooze clear fluid.    The blisters dry up and a yellow crust appears in its place.   The crust falls off.  Symptoms depend on whether it is the initial outbreak or a recurrence. Some other symptoms with the first outbreak may include:   Fever.   Sore throat.   Headache.   Muscle aches.   Swollen neck glands.  DIAGNOSIS  A diagnosis is often made based on your symptoms and looking at the sores. Sometimes, a sore may be swabbed and then examined in the lab to make a final diagnosis. If the sores are not present, blood tests can find the herpes simplex virus.  TREATMENT  There is no cure for cold sores and no vaccine for the herpes simplex virus. Within 2 weeks, most cold sores go away on their own without treatment. Medicines cannot make the infection go away, but medicine can help relieve some of the pain associated with the sores, can work to stop the virus from multiplying, and can also shorten healing time. Medicine may be in the form of creams, gels, pills, or a shot.  HOME CARE INSTRUCTIONS   Only take over-the-counter or prescription medicines for pain, discomfort, or fever as directed by your caregiver. Do not use aspirin.   Use a cotton-tip swab to apply creams or gels to your sores.   Do not touch the sores or pick the scabs. Wash your hands often. Do not touch your eyes without washing your hands first.   Avoid kissing, oral sex, and sharing personal items until sores heal.  Apply an ice pack on your sores for 10-15 minutes to ease any discomfort.   Avoid hot, cold, or salty foods because they may hurt your mouth. Eat a soft, bland diet to avoid irritating the sores. Use a straw to drink if you have pain when drinking out of a glass.   Keep sores clean and dry to prevent an infection of other tissues.   Avoid the sun and limit stress if these things trigger outbreaks. If sun causes cold sores, apply sunscreen on the lips before being out in the sun.  SEEK MEDICAL CARE IF:    You have a fever or persistent symptoms for more than 2-3 days.   You have a fever and your symptoms suddenly get worse.   You have pus, not clear fluid, coming from the sores.   You have redness that is spreading.   You have pain or irritation in your eye.   You get sores on your genitals.   Your sores do not heal within 2 weeks.   You have a weakened immune system.   You have frequent recurrences of cold sores.  MAKE SURE YOU:   Understand these instructions.  Will watch your condition.  Will get help right away if you are not doing well or get worse.   This information is not intended to replace advice given to you by your health care provider. Make sure you discuss any questions you have with your health care provider.   Document Released: 05/04/2000 Document Revised: 05/28/2014 Document Reviewed: 09/19/2011 Elsevier Interactive Patient Education [employer].

## 2015-09-07 NOTE — Assessment & Plan Note (Addendum)
Right flank and lower right quadrant pain that is gradually improving with concern for possible ovarian cyst or possible kidney stone. In office urinalysis negative for nitrites, leukocytes, and hematuria. Exam unlikely for appendicitis. Continue to monitor at this time and if symptoms remain or worsen we'll seek further imaging and assessment.

## 2015-09-07 NOTE — Progress Notes (Signed)
Pre visit review using our clinic review tool, if applicable. No additional management support is needed unless otherwise documented below in the visit note. 

## 2015-09-07 NOTE — Assessment & Plan Note (Signed)
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 No cold sore noted on evaluation today. Continue over-the-counter medications as needed for symptom relief. Follow-up if symptoms return or worsen.

## 2015-09-07 NOTE — Progress Notes (Signed)
Subjective:    Patient ID: Susan Roth, female    DOB: 24-Apr-1976, 40 y.o.   MRN: [id number]  Chief Complaint  Patient presents with  . Flank Pain    has pain in right side, urine odor, has been going on since last week, also wants to see if she can get something for cold sores    HPI:  Susan Roth is a 40 y.o. female who  has a past medical history of Allergic rhinitis; Herpes zoster without mention of complication; Dysfunction of eustachian tube; Obesity; Endometriosis; IBS (irritable bowel syndrome); Kidney calculi; and Allergy. and presents today for an acute office visit.   1.) Urinary odor - This is a new problem. Associated symptom of a urinary odor and right side flank pain that has ben going on for about 1 week. Does have history of kidney stone. Denies fevers, dysuria, urinary frequency or urgency, or vaginal discharge.   2.) Cold sores - This is a new problem. Associated symptoms of cold sores located around her right side of her lip has been going on for about 1 month. She has had 2 small outbreaks over the past month. Modifying factors include Cathey Endow which has helped with her symptoms. No history of oral HSV in the past.   Allergies  Allergen Reactions  . Amoxicillin Hives  . Penicillins Hives  . Wellbutrin [Bupropion Hcl] Hives     Current Outpatient Prescriptions on File Prior to Visit  Medication Sig Dispense Refill  . Probiotic Product (PROBIOTIC PO) Take 1 capsule by mouth daily.     No current facility-administered medications on file prior to visit.     Past Surgical History  Procedure Laterality Date  . Wisdom tooth extraction    . Tonsillectomy      as an adult - 2011  . Cesarean section  1998    x1  . Laparoscopic tubal ligation N/A 08/04/2012    Procedure: LAPAROSCOPIC TUBAL LIGATION;  Surgeon: Emily Filbert, MD;  Location: [facility];  Service: Gynecology;  Laterality: N/A;  Operative lsc for right salpingectomy & Mirena insertion  . Bilateral  salpingectomy Right 08/04/2012    Procedure: BILATERAL SALPINGECTOMY;  Surgeon: Emily Filbert, MD;  Location: [facility];  Service: Gynecology;  Laterality: Right;  . Colonoscopy      Past Medical History  Diagnosis Date  . Allergic rhinitis   . Herpes zoster without mention of complication   . Dysfunction of eustachian tube   . Obesity   . Endometriosis   . IBS (irritable bowel syndrome)   . Kidney calculi   . Allergy      Review of Systems  Constitutional: Negative for fever and chills.  Genitourinary: Positive for flank pain. Negative for dysuria, urgency, frequency, hematuria and vaginal discharge.      Objective:    BP 120/84 mmHg  Pulse 79  Temp(Src) 97.5 F (36.4 C) (Oral)  Resp 16  Ht 5\' 4"  (1.626 m)  Wt 189 lb (85.73 kg)  BMI 32.43 kg/m2  SpO2 98% Nursing note and vital signs reviewed.  Physical Exam  Constitutional: She is oriented to person, place, and time. She appears well-developed and well-nourished. No distress.  HENT:  Mouth/Throat: Oropharynx is clear and moist and mucous membranes are normal.  No evidence of cold sore noted  Cardiovascular: Normal rate, regular rhythm, normal heart sounds and intact distal pulses.   Pulmonary/Chest: Effort normal and breath sounds normal.  Abdominal: Normal appearance and bowel sounds are  normal. She exhibits no mass. There is no hepatosplenomegaly. There is tenderness in the right lower quadrant. There is guarding. There is no rigidity, no rebound, no CVA tenderness, no tenderness at McBurney's point and negative Murphy's sign.  Neurological: She is alert and oriented to person, place, and time.  Skin: Skin is warm and dry.  Psychiatric: She has a normal mood and affect. Her behavior is normal. Judgment and thought content normal.       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 No cold sore noted on evaluation today. Continue over-the-counter medications as needed for symptom relief. Follow-up if symptoms return or worsen.        Other   Right flank pain    Right flank and lower right quadrant pain that is gradually improving with concern for possible ovarian cyst or possible kidney stone. In office urinalysis negative for nitrites, leukocytes, and hematuria. Exam unlikely for appendicitis. Continue to monitor at this time and if symptoms remain or worsen we'll seek further imaging and assessment.       Other Visit Diagnoses    Abnormal urine odor    -  Primary    Relevant Orders    POCT urinalysis dipstick (Completed)    Urine culture        I am having [name] maintain her Probiotic Product (PROBIOTIC PO).  Follow-up: Return if symptoms worsen or fail to improve.  Mauricio Po, FNP

## 2015-09-10 ENCOUNTER — Encounter: Payer: Self-pay | Admitting: Family

## 2015-09-10 LAB — URINE CULTURE: Colony Count: 80000

## 2015-09-12 ENCOUNTER — Encounter: Payer: Self-pay | Admitting: Family

## 2015-09-13 MED ORDER — CIPROFLOXACIN HCL 500 MG PO TABS: 500.0000 mg | ORAL_TABLET | Freq: Two times a day (BID) | ORAL | Status: DC

## 2015-09-13 MED ORDER — PHENAZOPYRIDINE HCL 100 MG PO TABS: 100.0000 mg | ORAL_TABLET | Freq: Three times a day (TID) | ORAL | Status: DC | PRN

## 2015-09-22 ENCOUNTER — Ambulatory Visit (INDEPENDENT_AMBULATORY_CARE_PROVIDER_SITE_OTHER): Payer: 59 | Admitting: Family

## 2015-09-22 ENCOUNTER — Encounter: Payer: Self-pay | Admitting: Family

## 2015-09-22 VITALS — BP 128/82 | HR 78 | Temp 98.1°F | Resp 16 | Ht 64.0 in | Wt 190.1 lb

## 2015-09-22 DIAGNOSIS — Z0000 Encounter for general adult medical examination without abnormal findings: Secondary | ICD-10-CM | POA: Insufficient documentation

## 2015-09-22 NOTE — Assessment & Plan Note (Signed)
1) Anticipatory Guidance: Discussed importance of wearing a seatbelt while driving and not texting while driving; changing batteries in smoke detector at least once annually; wearing suntan lotion when outside; eating a balanced and moderate diet; getting physical activity at least 30 minutes per day.  2) Immunizations / Screenings / Labs:  All immunizations are up-to-date per recommendations. Due for a dental screening encouraged to be completed independently. Obtain vitamin D for vitamin D deficiency. All other screenings are up-to-date per recommendations. Obtain CBC, CMET, Lipid profile and TSH.   Overall well exam with risk factors for cardiovascular disease including obesity. Recommend weight loss approximate 5-10% of current body weight through lifestyle changes.Recommend increasing physical activity to 30 minutes of moderate level activity daily. Encourage nutritional intake that focuses on nutrient dense foods and is moderate, varied, and balanced and is low in saturated fats and processed/sugary foods. Continue other healthy lifestyle behaviors and choices. Follow-up prevention exam in 1 year. Follow-up office visit pending blood work as necessary.

## 2015-09-22 NOTE — Patient Instructions (Addendum)
Thank you for choosing [facility].  Summary/Instructions:  Health Maintenance, Female Adopting a healthy lifestyle and getting preventive care can go a long way to promote health and wellness. Talk with your health care provider about what schedule of regular examinations is right for you. This is a good chance for you to check in with your provider about disease prevention and staying healthy. In between checkups, there are plenty of things you can do on your own. Experts have done a lot of research about which lifestyle changes and preventive measures are most likely to keep you healthy. Ask your health care provider for more information. WEIGHT AND DIET  Eat a healthy diet  Be sure to include plenty of vegetables, fruits, low-fat dairy products, and lean protein.  Do not eat a lot of foods high in solid fats, added sugars, or salt.  Get regular exercise. This is one of the most important things you can do for your health.  Most adults should exercise for at least 150 minutes each week. The exercise should increase your heart rate and make you sweat (moderate-intensity exercise).  Most adults should also do strengthening exercises at least twice a week. This is in addition to the moderate-intensity exercise.  Maintain a healthy weight  Body mass index (BMI) is a measurement that can be used to identify possible weight problems. It estimates body fat based on height and weight. Your health care provider can help determine your BMI and help you achieve or maintain a healthy weight.  For females 28 years of age and older:   A BMI below 18.5 is considered underweight.  A BMI of 18.5 to 24.9 is normal.  A BMI of 25 to 29.9 is considered overweight.  A BMI of 30 and above is considered obese.  Watch levels of cholesterol and blood lipids  You should start having your blood tested for lipids and cholesterol at 40 years of age, then have this test every 5 years.  You may need  to have your cholesterol levels checked more often if:  Your lipid or cholesterol levels are high.  You are older than 40 years of age.  You are at high risk for heart disease.  CANCER SCREENING   Lung Cancer  Lung cancer screening is recommended for adults 76-55 years old who are at high risk for lung cancer because of a history of smoking.  A yearly low-dose CT scan of the lungs is recommended for people who:  Currently smoke.  Have quit within the past 15 years.  Have at least a 30-pack-year history of smoking. A pack year is smoking an average of one pack of cigarettes a day for 1 year.  Yearly screening should continue until it has been 15 years since you quit.  Yearly screening should stop if you develop a health problem that would prevent you from having lung cancer treatment.  Breast Cancer  Practice breast self-awareness. This means understanding how your breasts normally appear and feel.  It also means doing regular breast self-exams. Let your health care provider know about any changes, no matter how small.  If you are in your 20s or 30s, you should have a clinical breast exam (CBE) by a health care provider every 1-3 years as part of a regular health exam.  If you are 28 or older, have a CBE every year. Also consider having a breast X-ray (mammogram) every year.  If you have a family history of breast cancer, talk to your  health care provider about genetic screening.  If you are at high risk for breast cancer, talk to your health care provider about having an MRI and a mammogram every year.  Breast cancer gene (BRCA) assessment is recommended for women who have family members with BRCA-related cancers. BRCA-related cancers include:  Breast.  Ovarian.  Tubal.  Peritoneal cancers.  Results of the assessment will determine the need for genetic counseling and BRCA1 and BRCA2 testing. Cervical Cancer Your health care provider may recommend that you be  screened regularly for cancer of the pelvic organs (ovaries, uterus, and vagina). This screening involves a pelvic examination, including checking for microscopic changes to the surface of your cervix (Pap test). You may be encouraged to have this screening done every 3 years, beginning at age 78.  For women ages 13-65, health care providers may recommend pelvic exams and Pap testing every 3 years, or they may recommend the Pap and pelvic exam, combined with testing for human papilloma virus (HPV), every 5 years. Some types of HPV increase your risk of cervical cancer. Testing for HPV may also be done on women of any age with unclear Pap test results.  Other health care providers may not recommend any screening for nonpregnant women who are considered low risk for pelvic cancer and who do not have symptoms. Ask your health care provider if a screening pelvic exam is right for you.  If you have had past treatment for cervical cancer or a condition that could lead to cancer, you need Pap tests and screening for cancer for at least 20 years after your treatment. If Pap tests have been discontinued, your risk factors (such as having a new sexual partner) need to be reassessed to determine if screening should resume. Some women have medical problems that increase the chance of getting cervical cancer. In these cases, your health care provider may recommend more frequent screening and Pap tests. Colorectal Cancer  This type of cancer can be detected and often prevented.  Routine colorectal cancer screening usually begins at 40 years of age and continues through 40 years of age.  Your health care provider may recommend screening at an earlier age if you have risk factors for colon cancer.  Your health care provider may also recommend using home test kits to check for hidden blood in the stool.  A small camera at the end of a tube can be used to examine your colon directly (sigmoidoscopy or colonoscopy).  This is done to check for the earliest forms of colorectal cancer.  Routine screening usually begins at age 9.  Direct examination of the colon should be repeated every 5-10 years through 40 years of age. However, you may need to be screened more often if early forms of precancerous polyps or small growths are found. Skin Cancer  Check your skin from head to toe regularly.  Tell your health care provider about any new moles or changes in moles, especially if there is a change in a mole's shape or color.  Also tell your health care provider if you have a mole that is larger than the size of a pencil eraser.  Always use sunscreen. Apply sunscreen liberally and repeatedly throughout the day.  Protect yourself by wearing long sleeves, pants, a wide-brimmed hat, and sunglasses whenever you are outside. HEART DISEASE, DIABETES, AND HIGH BLOOD PRESSURE   High blood pressure causes heart disease and increases the risk of stroke. High blood pressure is more likely to develop in:  People who have blood pressure in the high end of the normal range (130-139/85-89 mm Hg).  People who are overweight or obese.  People who are African American.  If you are 66-57 years of age, have your blood pressure checked every 3-5 years. If you are 80 years of age or older, have your blood pressure checked every year. You should have your blood pressure measured twice--once when you are at a hospital or clinic, and once when you are not at a hospital or clinic. Record the average of the two measurements. To check your blood pressure when you are not at a hospital or clinic, you can use:  An automated blood pressure machine at a pharmacy.  A home blood pressure monitor.  If you are between 70 years and 51 years old, ask your health care provider if you should take aspirin to prevent strokes.  Have regular diabetes screenings. This involves taking a blood sample to check your fasting blood sugar level.  If you  are at a normal weight and have a low risk for diabetes, have this test once every three years after 40 years of age.  If you are overweight and have a high risk for diabetes, consider being tested at a younger age or more often. PREVENTING INFECTION  Hepatitis B  If you have a higher risk for hepatitis B, you should be screened for this virus. You are considered at high risk for hepatitis B if:  You were born in a country where hepatitis B is common. Ask your health care provider which countries are considered high risk.  Your parents were born in a high-risk country, and you have not been immunized against hepatitis B (hepatitis B vaccine).  You have HIV or AIDS.  You use needles to inject street drugs.  You live with someone who has hepatitis B.  You have had sex with someone who has hepatitis B.  You get hemodialysis treatment.  You take certain medicines for conditions, including cancer, organ transplantation, and autoimmune conditions. Hepatitis C  Blood testing is recommended for:  Everyone born from [birth year] through 1965.  Anyone with known risk factors for hepatitis C. Sexually transmitted infections (STIs)  You should be screened for sexually transmitted infections (STIs) including gonorrhea and chlamydia if:  You are sexually active and are younger than 40 years of age.  You are older than 40 years of age and your health care provider tells you that you are at risk for this type of infection.  Your sexual activity has changed since you were last screened and you are at an increased risk for chlamydia or gonorrhea. Ask your health care provider if you are at risk.  If you do not have HIV, but are at risk, it may be recommended that you take a prescription medicine daily to prevent HIV infection. This is called pre-exposure prophylaxis (PrEP). You are considered at risk if:  You are sexually active and do not regularly use condoms or know the HIV status of your  partner(s).  You take drugs by injection.  You are sexually active with a partner who has HIV. Talk with your health care provider about whether you are at high risk of being infected with HIV. If you choose to begin PrEP, you should first be tested for HIV. You should then be tested every 3 months for as long as you are taking PrEP.  PREGNANCY   If you are premenopausal and you may become pregnant, ask your health  care provider about preconception counseling.  If you may become pregnant, take 400 to 800 micrograms (mcg) of folic acid every day.  If you want to prevent pregnancy, talk to your health care provider about birth control (contraception). OSTEOPOROSIS AND MENOPAUSE   Osteoporosis is a disease in which the bones lose minerals and strength with aging. This can result in serious bone fractures. Your risk for osteoporosis can be identified using a bone density scan.  If you are 42 years of age or older, or if you are at risk for osteoporosis and fractures, ask your health care provider if you should be screened.  Ask your health care provider whether you should take a calcium or vitamin D supplement to lower your risk for osteoporosis.  Menopause may have certain physical symptoms and risks.  Hormone replacement therapy may reduce some of these symptoms and risks. Talk to your health care provider about whether hormone replacement therapy is right for you.  HOME CARE INSTRUCTIONS   Schedule regular health, dental, and eye exams.  Stay current with your immunizations.   Do not use any tobacco products including cigarettes, chewing tobacco, or electronic cigarettes.  If you are pregnant, do not drink alcohol.  If you are breastfeeding, limit how much and how often you drink alcohol.  Limit alcohol intake to no more than 1 drink per day for nonpregnant women. One drink equals 12 ounces of beer, 5 ounces of wine, or 1 ounces of hard liquor.  Do not use street drugs.  Do  not share needles.  Ask your health care provider for help if you need support or information about quitting drugs.  Tell your health care provider if you often feel depressed.  Tell your health care provider if you have ever been abused or do not feel safe at home.   This information is not intended to replace advice given to you by your health care provider. Make sure you discuss any questions you have with your health care provider.   Document Released: 11/20/2010 Document Revised: 05/28/2014 Document Reviewed: 04/08/2013 Elsevier Interactive Patient Education [employer].
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
## 2015-09-22 NOTE — Progress Notes (Signed)
Pre visit review using our clinic review tool, if applicable. No additional management support is needed unless otherwise documented below in the visit note. 

## 2015-09-26 ENCOUNTER — Other Ambulatory Visit (INDEPENDENT_AMBULATORY_CARE_PROVIDER_SITE_OTHER): Payer: 59

## 2015-09-26 ENCOUNTER — Encounter: Payer: Self-pay | Admitting: Family

## 2015-09-26 DIAGNOSIS — Z0000 Encounter for general adult medical examination without abnormal findings: Secondary | ICD-10-CM | POA: Diagnosis not present

## 2015-09-26 LAB — LIPID PANEL
CHOL/HDL RATIO: 2
Cholesterol: 157 mg/dL (ref 0–200)
HDL: 64.9 mg/dL (ref >39.00)
LDL Cholesterol: 82 mg/dL (ref 0–99)
NONHDL: 91.85
TRIGLYCERIDES: 47 mg/dL (ref 0.0–149.0)
VLDL: 9.4 mg/dL (ref 0.0–40.0)

## 2015-09-26 LAB — COMPREHENSIVE METABOLIC PANEL
ALBUMIN: 4.1 g/dL (ref 3.5–5.2)
ALT: 10 U/L (ref 0–35)
AST: 14 U/L (ref 0–37)
Alkaline Phosphatase: 43 U/L (ref 39–117)
BILIRUBIN TOTAL: 0.6 mg/dL (ref 0.2–1.2)
BUN: 15 mg/dL (ref 6–23)
CALCIUM: 9.1 mg/dL (ref 8.4–10.5)
CO2: 29 mEq/L (ref 19–32)
CREATININE: 0.83 mg/dL (ref 0.40–1.20)
Chloride: 100 mEq/L (ref 96–112)
GFR: 98.06 mL/min (ref >60.00)
Glucose, Bld: 89 mg/dL (ref 70–99)
Potassium: 3.8 mEq/L (ref 3.5–5.1)
Sodium: 135 mEq/L (ref 135–145)
Total Protein: 7.2 g/dL (ref 6.0–8.3)

## 2015-09-26 LAB — CBC
HCT: 40.2 % (ref 36.0–46.0)
Hemoglobin: 13.7 g/dL (ref 12.0–15.0)
MCHC: 34.1 g/dL (ref 30.0–36.0)
MCV: 94.3 fl (ref 78.0–100.0)
PLATELETS: 338 10*3/uL (ref 150.0–400.0)
RBC: 4.26 Mil/uL (ref 3.87–5.11)
RDW: 12.8 % (ref 11.5–15.5)
WBC: 4.4 10*3/uL (ref 4.0–10.5)

## 2015-09-26 LAB — TSH: TSH: 1.61 u[IU]/mL (ref 0.35–4.50)

## 2015-09-26 LAB — VITAMIN D 25 HYDROXY (VIT D DEFICIENCY, FRACTURES): VITD: 28.86 ng/mL — AB (ref 30.00–100.00)

## 2015-10-25 ENCOUNTER — Ambulatory Visit (INDEPENDENT_AMBULATORY_CARE_PROVIDER_SITE_OTHER): Payer: 59 | Admitting: Obstetrics & Gynecology

## 2015-10-25 ENCOUNTER — Encounter: Payer: Self-pay | Admitting: Obstetrics & Gynecology

## 2015-10-25 VITALS — BP 107/70 | HR 80 | Resp 16 | Ht 64.0 in | Wt 186.0 lb

## 2015-10-25 DIAGNOSIS — N898 Other specified noninflammatory disorders of vagina: Secondary | ICD-10-CM

## 2015-10-25 NOTE — Progress Notes (Signed)
   Subjective:    Patient ID: Susan Roth, female    DOB: Jun 16, 1975, 40 y.o.   MRN: [id number]  HPI  40 yo Separated AA P1 (40 yo daughter) here today with the complaint of vaginal dryness. She is using Replens, some natural lubricant she found on [employer], and Astroglide.  She is having a period every month. She denies a size issue and she wants to have sex. She says that she doesn't even have a normal vaginal discharge  She also complains of leaking her urine. She seems to have some urge incontinence. She has already made an appt with a urologist for next month.  Review of Systems     Objective:   Physical Exam [id number] Breathing, conversing, and ambulating normally Vagina with a paucity of discharge Cervix with clear, sticky discharge c/w ovulation       Assessment & Plan:  Vaginal dryness with sex, paucity of normal vaginal discharge Wet prep sent Rec Align probiotic Rec yogourt with active culture per vagina daily for a week

## 2015-10-26 ENCOUNTER — Telehealth: Payer: Self-pay | Admitting: *Deleted

## 2015-10-26 DIAGNOSIS — B9689 Other specified bacterial agents as the cause of diseases classified elsewhere: Secondary | ICD-10-CM

## 2015-10-26 DIAGNOSIS — N760 Acute vaginitis: Principal | ICD-10-CM

## 2015-10-26 LAB — WET PREP, GENITAL
Trich, Wet Prep: NONE SEEN
WBC WET PREP: NONE SEEN
Yeast Wet Prep HPF POC: NONE SEEN

## 2015-10-26 MED ORDER — METRONIDAZOLE 500 MG PO TABS: 500.0000 mg | ORAL_TABLET | Freq: Two times a day (BID) | ORAL | Status: DC

## 2015-10-26 NOTE — Telephone Encounter (Signed)
LM on voicemail of positive BV on wet prep.  RX for Flagyl sent to [facility] per Dr Hulan Fray

## 2015-12-15 ENCOUNTER — Telehealth: Payer: Self-pay | Admitting: *Deleted

## 2015-12-15 DIAGNOSIS — B3731 Acute candidiasis of vulva and vagina: Secondary | ICD-10-CM

## 2015-12-15 DIAGNOSIS — B373 Candidiasis of vulva and vagina: Secondary | ICD-10-CM

## 2015-12-15 MED ORDER — FLUCONAZOLE 150 MG PO TABS: 150.0000 mg | ORAL_TABLET | Freq: Once | ORAL | 0 refills | Status: AC

## 2015-12-15 NOTE — Telephone Encounter (Signed)
Pt called stating that she went to her urologist and was given antibiotic and now has yeast infection.  She is requesting something for yeast.  Per Dr Hulan Fray may have Diflucan 150 mg.  This was sent to her pharmacy.

## 2015-12-21 ENCOUNTER — Encounter: Payer: Self-pay | Admitting: Family

## 2015-12-22 MED ORDER — VALACYCLOVIR HCL 1 G PO TABS: 2000.0000 mg | ORAL_TABLET | Freq: Two times a day (BID) | ORAL | 0 refills | Status: DC

## 2016-01-08 ENCOUNTER — Encounter (HOSPITAL_BASED_OUTPATIENT_CLINIC_OR_DEPARTMENT_OTHER): Payer: Self-pay | Admitting: Emergency Medicine

## 2016-01-08 ENCOUNTER — Emergency Department (HOSPITAL_BASED_OUTPATIENT_CLINIC_OR_DEPARTMENT_OTHER)
Admission: EM | Admit: 2016-01-08 | Discharge: 2016-01-08 | Disposition: A | Discharge status: Home / Self Care | Payer: 59 | Attending: Dermatology | Admitting: Dermatology

## 2016-01-08 DIAGNOSIS — Z5321 Procedure and treatment not carried out due to patient leaving prior to being seen by health care provider: Secondary | ICD-10-CM | POA: Diagnosis not present

## 2016-01-08 DIAGNOSIS — Z7722 Contact with and (suspected) exposure to environmental tobacco smoke (acute) (chronic): Secondary | ICD-10-CM | POA: Diagnosis not present

## 2016-01-08 DIAGNOSIS — R109 Unspecified abdominal pain: Secondary | ICD-10-CM | POA: Insufficient documentation

## 2016-01-08 LAB — URINE MICROSCOPIC-ADD ON: RBC / HPF: NONE SEEN RBC/hpf (ref 0–5)

## 2016-01-08 LAB — URINALYSIS, ROUTINE W REFLEX MICROSCOPIC
Bilirubin Urine: NEGATIVE
GLUCOSE, UA: NEGATIVE mg/dL
Ketones, ur: NEGATIVE mg/dL
Nitrite: NEGATIVE
PROTEIN: NEGATIVE mg/dL
Specific Gravity, Urine: 1.016 (ref 1.005–1.030)
pH: 6.5 (ref 5.0–8.0)

## 2016-01-08 NOTE — ED Notes (Signed)
Pt called twice to be roomed. No answer.

## 2016-01-08 NOTE — ED Triage Notes (Signed)
Pt states abd pain x 2 weeks, denies any n/v/d, just gtting worse

## 2016-01-09 ENCOUNTER — Emergency Department (HOSPITAL_BASED_OUTPATIENT_CLINIC_OR_DEPARTMENT_OTHER)
Admission: EM | Admit: 2016-01-09 | Discharge: 2016-01-09 | Disposition: A | Discharge status: Home / Self Care | Payer: 59 | Attending: Emergency Medicine | Admitting: Emergency Medicine

## 2016-01-09 ENCOUNTER — Emergency Department (HOSPITAL_BASED_OUTPATIENT_CLINIC_OR_DEPARTMENT_OTHER): Payer: 59

## 2016-01-09 ENCOUNTER — Encounter (HOSPITAL_BASED_OUTPATIENT_CLINIC_OR_DEPARTMENT_OTHER): Payer: Self-pay

## 2016-01-09 DIAGNOSIS — Z79899 Other long term (current) drug therapy: Secondary | ICD-10-CM | POA: Diagnosis not present

## 2016-01-09 DIAGNOSIS — R1010 Upper abdominal pain, unspecified: Secondary | ICD-10-CM | POA: Insufficient documentation

## 2016-01-09 LAB — CBC WITH DIFFERENTIAL/PLATELET
Basophils Absolute: 0 10*3/uL (ref 0.0–0.1)
Basophils Relative: 0 %
EOS ABS: 0.1 10*3/uL (ref 0.0–0.7)
EOS PCT: 1 %
HCT: 38.8 % (ref 36.0–46.0)
Hemoglobin: 13.5 g/dL (ref 12.0–15.0)
Lymphocytes Relative: 38 %
Lymphs Abs: 1.8 10*3/uL (ref 0.7–4.0)
MCH: 32.8 pg (ref 26.0–34.0)
MCHC: 34.8 g/dL (ref 30.0–36.0)
MCV: 94.2 fL (ref 78.0–100.0)
Monocytes Absolute: 0.5 10*3/uL (ref 0.1–1.0)
Monocytes Relative: 11 %
Neutro Abs: 2.3 10*3/uL (ref 1.7–7.7)
Neutrophils Relative %: 50 %
Platelets: 355 10*3/uL (ref 150–400)
RBC: 4.12 MIL/uL (ref 3.87–5.11)
RDW: 11.6 % (ref 11.5–15.5)
WBC: 4.7 10*3/uL (ref 4.0–10.5)

## 2016-01-09 LAB — BASIC METABOLIC PANEL
Anion gap: 5 (ref 5–15)
BUN: 8 mg/dL (ref 6–20)
CO2: 28 mmol/L (ref 22–32)
CREATININE: 0.72 mg/dL (ref 0.44–1.00)
Calcium: 8.1 mg/dL — ABNORMAL LOW (ref 8.9–10.3)
Chloride: 103 mmol/L (ref 101–111)
GFR calc Af Amer: >60 mL/min (ref >60)
GFR calc non Af Amer: >60 mL/min (ref >60)
Glucose, Bld: 89 mg/dL (ref 65–99)
Potassium: 3.8 mmol/L (ref 3.5–5.1)
SODIUM: 136 mmol/L (ref 135–145)

## 2016-01-09 LAB — PREGNANCY, URINE: Preg Test, Ur: NEGATIVE

## 2016-01-09 LAB — HEPATIC FUNCTION PANEL
ALT: 17 U/L (ref 14–54)
AST: 18 U/L (ref 15–41)
Albumin: 3.8 g/dL (ref 3.5–5.0)
Alkaline Phosphatase: 46 U/L (ref 38–126)
BILIRUBIN TOTAL: 0.3 mg/dL (ref 0.3–1.2)
Total Protein: 6.9 g/dL (ref 6.5–8.1)

## 2016-01-09 LAB — URINALYSIS, ROUTINE W REFLEX MICROSCOPIC
Bilirubin Urine: NEGATIVE
Glucose, UA: NEGATIVE mg/dL
Hgb urine dipstick: NEGATIVE
Ketones, ur: NEGATIVE mg/dL
Leukocytes, UA: NEGATIVE
Nitrite: NEGATIVE
PH: 6.5 (ref 5.0–8.0)
Protein, ur: NEGATIVE mg/dL
SPECIFIC GRAVITY, URINE: 1.008 (ref 1.005–1.030)

## 2016-01-09 LAB — LIPASE, BLOOD: LIPASE: 26 U/L (ref 11–51)

## 2016-01-09 MED ORDER — OMEPRAZOLE 20 MG PO CPDR: 20.0000 mg | DELAYED_RELEASE_CAPSULE | Freq: Every day | ORAL | 0 refills | Status: DC

## 2016-01-09 MED FILL — OMEPRAZOLE DR 20 MG CAPSULE: 20 | 30 days supply | Qty: 30 | Fill #0

## 2016-01-09 NOTE — ED Notes (Signed)
MD at bedside discussing results with patient at this time. 

## 2016-01-09 NOTE — ED Notes (Signed)
Pt states that last BM today. States that there was bright red blood in stool.

## 2016-01-09 NOTE — ED Triage Notes (Signed)
C/o abd pain x 3 weeks-+nausea-denies v/d-NAD-steady gait

## 2016-01-09 NOTE — ED Notes (Signed)
Pt in US at this time 

## 2016-01-09 NOTE — ED Notes (Signed)
MD at bedside. 

## 2016-01-09 NOTE — ED Provider Notes (Signed)
[facility] Provider Note   CSN: [id number] Arrival date & time: 01/09/16  1307   By signing my name below, I, Dolores Hoose, attest that this documentation has been prepared under the direction and in the presence of Jola Schmidt, MD . Electronically Signed: Dolores Hoose, Scribe. 01/09/2016. 3:55 PM.  History   Chief Complaint Chief Complaint  Patient presents with  . Abdominal Pain   The history is provided by the patient. No language interpreter was used.     HPI Comments:  Susan Roth is a 40 y.o. female with PMHx of endometriosis who presents to the Emergency Department complaining of waxing/waning unchanged upper abdomen pain onset three weeks ago. Her reports that pain is worsened by eating with no alleviating factors indicated. She reports associated nausea and larger volume of defecation. She denies any vomiting, diarrhea, CP, dysuria, hematuria, or SOB. Pt reports she started taking bactrim a month ago when the symptoms began.   Past Medical History:  Diagnosis Date  . Abnormal Pap smear of cervix    cryo  . Allergic rhinitis   . Allergy   . Dysfunction of eustachian tube   . Endometriosis   . Herpes zoster without mention of complication   . IBS (irritable bowel syndrome)   . Kidney calculi   . Obesity     Patient Active Problem List   Diagnosis Date Noted  . Routine general medical examination at a health care facility 09/22/2015  . 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 Right flank pain 09/07/2015  . Scalp irritation 07/01/2015  . Low back pain 12/06/2014  . RUQ discomfort 07/20/2014  . Sinusitis, acute 04/06/2014  . Paresthesia 02/09/2013  . Urethritis 12/30/2012  . IBS (irritable bowel syndrome) 11/13/2012  . Admission for sterilization 08/04/2012  . Dysmenorrhea 08/04/2012  . Obesity 06/25/2012  . 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 Anxiety and depression 09/26/2010  . ACNE, MILD 09/27/2008  . HERPES ZOSTER 09/17/2008  . Allergic  rhinitis due to other allergen 09/17/2008    Past Surgical History:  Procedure Laterality Date  . BILATERAL SALPINGECTOMY Right 08/04/2012   Procedure: BILATERAL SALPINGECTOMY;  Surgeon: Emily Filbert, MD;  Location: [facility];  Service: Gynecology;  Laterality: Right;  . CESAREAN SECTION  1998   x1  . COLONOSCOPY    . LAPAROSCOPIC TUBAL LIGATION N/A 08/04/2012   Procedure: LAPAROSCOPIC TUBAL LIGATION;  Surgeon: Emily Filbert, MD;  Location: [facility];  Service: Gynecology;  Laterality: N/A;  Operative lsc for right salpingectomy & Mirena insertion  . TONSILLECTOMY     as an adult - 2011  . WISDOM TOOTH EXTRACTION      OB History    Gravida Para Term Preterm AB Living   1 1 0 1 0 1   SAB TAB Ectopic Multiple Live Births   0 0 0 0 1       Home Medications    Prior to Admission medications   Medication Sig Start Date End Date Taking? Authorizing Provider  Calcium Carbonate Antacid (TUMS E-X PO) Take by mouth.   Yes Historical Provider, MD  Esomeprazole Magnesium (NEXIUM PO) Take by mouth.   Yes Historical Provider, MD    Family History Family History  Problem Relation Age of Onset  . Hyperlipidemia Mother   . Colon polyps Mother   . Nephrolithiasis Mother   . Diverticulosis Mother   . Nephrolithiasis Father   . Depression Father     bipolar  . Colon cancer Father 60  .  Asthma Sister   . Colon cancer Paternal Aunt 20  . Colon cancer Paternal Uncle 32    x 2 uncles  . Colon polyps Sister     paternal half sister; Lynch syndrome  . Colon cancer Other     double great uncle from both mom and dads side  . Asthma Sister   . Allergies Sister   . Allergies Brother   . Clotting disorder Maternal Uncle   . Ovarian cancer Maternal Grandmother   . Colon cancer Paternal Aunt   . Diabetes Paternal Aunt     Social History Social History  Substance Use Topics  . Smoking status: Never Smoker  . Smokeless tobacco: Never Used     Comment: positive hx of passive tobacco smoke  exposure  . Alcohol use 0.6 oz/week    1 Glasses of wine per week     Comment: occasionally     Allergies   Amoxicillin; Penicillins; and Wellbutrin [bupropion hcl]   Review of Systems Review of Systems   Physical Exam Updated Vital Signs BP 102/67 (BP Location: Right Arm)   Pulse 77   Temp 98.8 F (37.1 C) (Oral)   Resp 18   Ht 5\' 4"  (1.626 m)   Wt 185 lb (83.9 kg)   LMP 01/07/2016   SpO2 100%   BMI 31.76 kg/m   Physical Exam  Constitutional: She is oriented to person, place, and time. She appears well-developed and well-nourished.  HENT:  Head: Normocephalic.  Eyes: EOM are normal.  Neck: Normal range of motion.  Pulmonary/Chest: Effort normal.  Abdominal: Soft. She exhibits no distension. There is no tenderness.  Musculoskeletal: Normal range of motion.  Neurological: She is alert and oriented to person, place, and time.  Psychiatric: She has a normal mood and affect.  Nursing note and vitals reviewed.   ED Treatments / Results  DIAGNOSTIC STUDIES:  Oxygen Saturation is 100% on RA, normal by my interpretation.    COORDINATION OF CARE:  3:30 PM Discussed treatment plan with pt at bedside and pt agreed to plan.  100Labs (all labs ordered are listed, but only abnormal results are displayed) Labs Reviewed  BASIC METABOLIC PANEL - Abnormal; Notable for the following:       Result Value   Calcium 8.1 (*)    All other components within normal limits  HEPATIC FUNCTION PANEL - Abnormal; Notable for the following:    Bilirubin, Direct <0.1 (*)    All other components within normal limits  PREGNANCY, URINE  URINALYSIS, ROUTINE W REFLEX MICROSCOPIC (NOT AT [facility])  CBC WITH DIFFERENTIAL/PLATELET  LIPASE, BLOOD    EKG  EKG Interpretation None       Radiology US Abdomen Limited Ruq  Result Date: 01/09/2016 CLINICAL DATA:  Epigastric pain and nausea for the past 3 weeks EXAM: US ABDOMEN LIMITED - RIGHT UPPER QUADRANT COMPARISON:  Abdominal and pelvic CT  scan of August 27, 2011 and abdominal ultrasound of April 28, 2011 FINDINGS: Gallbladder: No gallstones or wall thickening visualized. No sonographic Murphy sign noted by sonographer. Common bile duct: Diameter: 3 mm Liver: No focal lesion identified. Within normal limits in parenchymal echogenicity. IMPRESSION: Normal right upper quadrant ultrasound examination. If there are clinical concerns of gallbladder dysfunction, a nuclear medicine hepatobiliary scan may be useful. Electronically Signed   By: David  Martinique M.D.   On: 01/09/2016 15:47    Procedures Procedures (including critical care time)  Medications Ordered in ED Medications - No data to display  Initial Impression / Assessment and Plan / ED Course  I have reviewed the triage vital signs and the nursing notes.  Pertinent labs & imaging results that were available during my care of the patient were reviewed by me and considered in my medical decision making (see chart for details).  Clinical Course    Patient is overall well-appearing.  She's continued to have intermittent upper abdominal pain.  Ultrasound is normal.  LFTs and lipase are normal.  She will need outpatient GI follow-up as she may benefit from endoscopy.  I'll place patient on daily Prilosec.  Primary care follow-up.  She understands to return to the ER for new or worsening symptoms   Final Clinical Impressions(s) / ED Diagnoses   Final diagnoses:  Upper abdominal pain    New Prescriptions New Prescriptions   No medications on file     Medical screening examination/treatment/procedure(s) were performed by non-physician practitioner and as supervising physician I was immediately available for consultation/collaboration.        Jola Schmidt, MD 01/09/16 [phone]

## 2016-03-20 ENCOUNTER — Encounter: Payer: Self-pay | Admitting: Obstetrics & Gynecology

## 2016-03-20 ENCOUNTER — Ambulatory Visit (INDEPENDENT_AMBULATORY_CARE_PROVIDER_SITE_OTHER): Payer: 59 | Admitting: Obstetrics & Gynecology

## 2016-03-20 VITALS — BP 111/69 | HR 73 | Ht 64.0 in | Wt 195.0 lb

## 2016-03-20 DIAGNOSIS — N898 Other specified noninflammatory disorders of vagina: Secondary | ICD-10-CM

## 2016-03-20 DIAGNOSIS — N926 Irregular menstruation, unspecified: Secondary | ICD-10-CM

## 2016-03-20 NOTE — Progress Notes (Signed)
   Subjective:    Patient ID: Susan Roth, female    DOB: 01-Jul-1975, 40 y.o.   MRN: [id number]  HPI 40 yo AA lady here with 2 episodes (2 days) a week earlier than her period. She is concerned. She also complains of vaginal dryness.   Review of Systems She has had her tubes removed.    Objective:   Physical Exam [id number] Breathing, conversing, and ambulating normally Abd- benign Cervix normal No vulvar/vaginal atrophy       Assessment & Plan:  One occasion of irregular bleeding- She is requesting an u/s so I ordered this Check TSH, [facility]

## 2016-03-21 ENCOUNTER — Ambulatory Visit (INDEPENDENT_AMBULATORY_CARE_PROVIDER_SITE_OTHER): Payer: 59

## 2016-03-21 ENCOUNTER — Ambulatory Visit: Payer: 59

## 2016-03-21 DIAGNOSIS — N898 Other specified noninflammatory disorders of vagina: Secondary | ICD-10-CM

## 2016-03-21 DIAGNOSIS — D259 Leiomyoma of uterus, unspecified: Secondary | ICD-10-CM | POA: Diagnosis not present

## 2016-03-21 DIAGNOSIS — N83202 Unspecified ovarian cyst, left side: Secondary | ICD-10-CM

## 2016-03-21 LAB — TSH: TSH: 2.46 m[IU]/L

## 2016-03-21 LAB — FOLLICLE STIMULATING HORMONE: FSH: 3.4 m[IU]/mL

## 2016-03-22 ENCOUNTER — Telehealth: Payer: Self-pay | Admitting: *Deleted

## 2016-03-22 NOTE — Telephone Encounter (Signed)
Pt notified of normal TSH and [place]

## 2016-03-22 NOTE — Telephone Encounter (Signed)
Pt notified of normal labs

## 2016-05-09 LAB — HM HEPATITIS C SCREENING LAB: HM Hepatitis Screen: NEGATIVE

## 2016-09-29 IMAGING — US US ABDOMEN LIMITED
1 series · 14 of 25 positions shown · non-contrast
Comparison: Abdominal and pelvic CT scan August 27, 2011 and
abdominal ultrasound April 28, 2011

CLINICAL DATA: Epigastric pain and nausea for the past 3 weeks

EXAM:
US ABDOMEN LIMITED - RIGHT UPPER QUADRANT

[Series 1: us abdomen limited · 0.20mm/px · 14 of 31 slices shown]
[im 1/31]
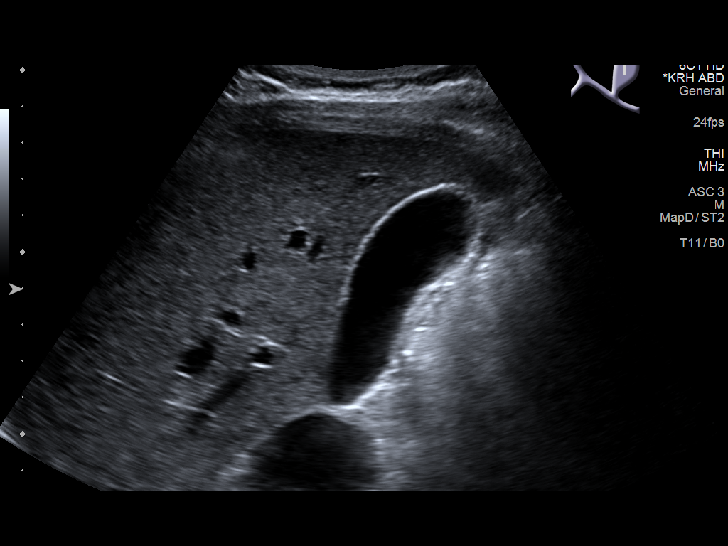
[im 3/31]
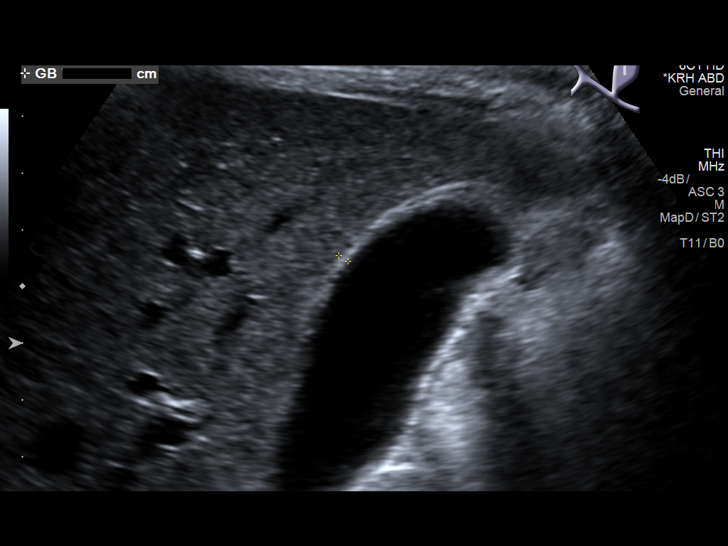
[im 6/31]
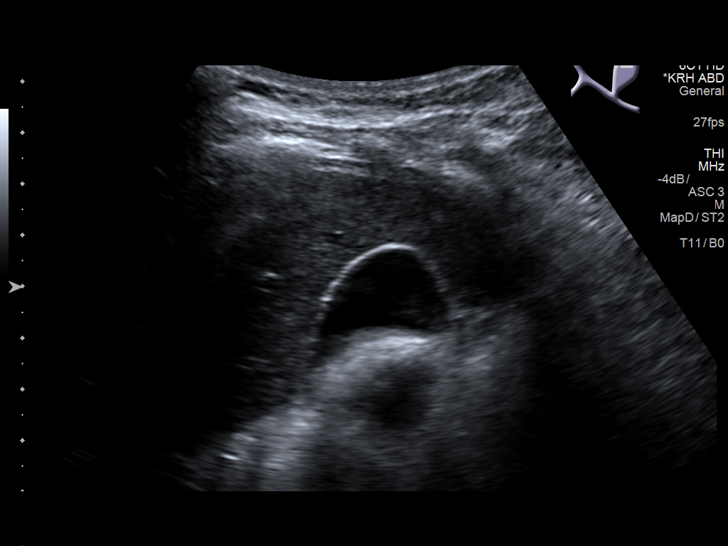
[im 8/31]
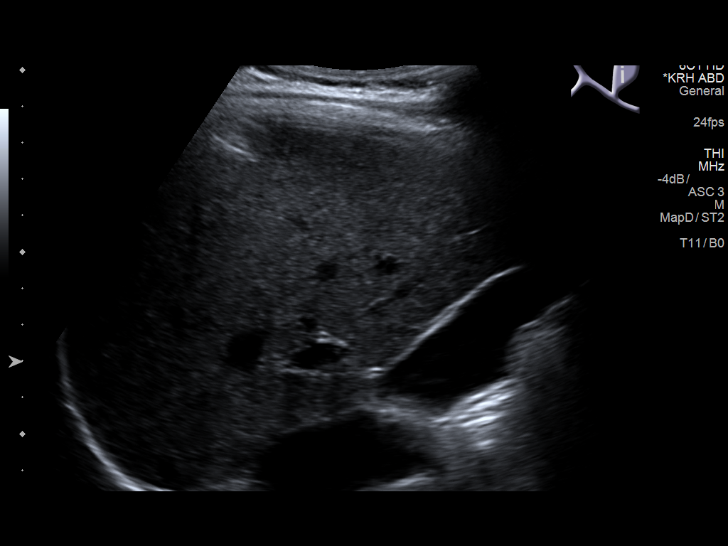
[im 11/31]
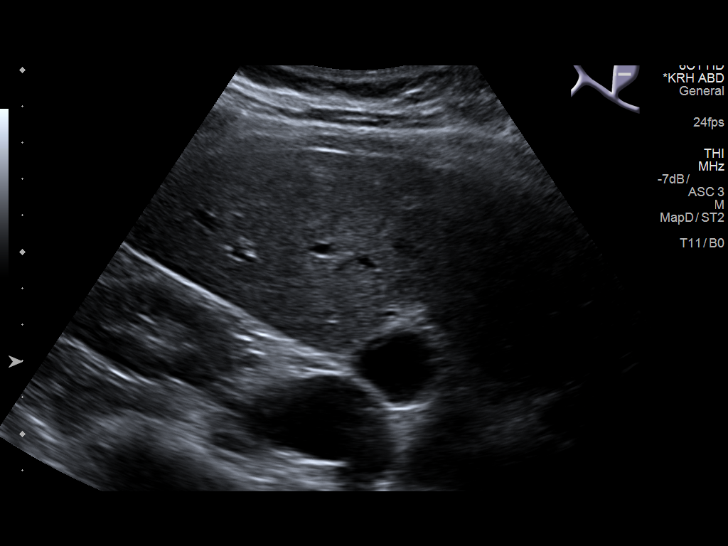
[im 12/31]
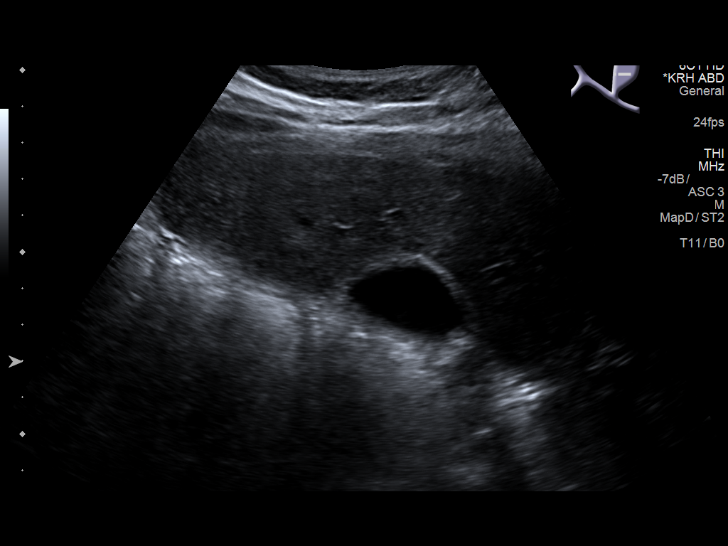
[im 14/31]
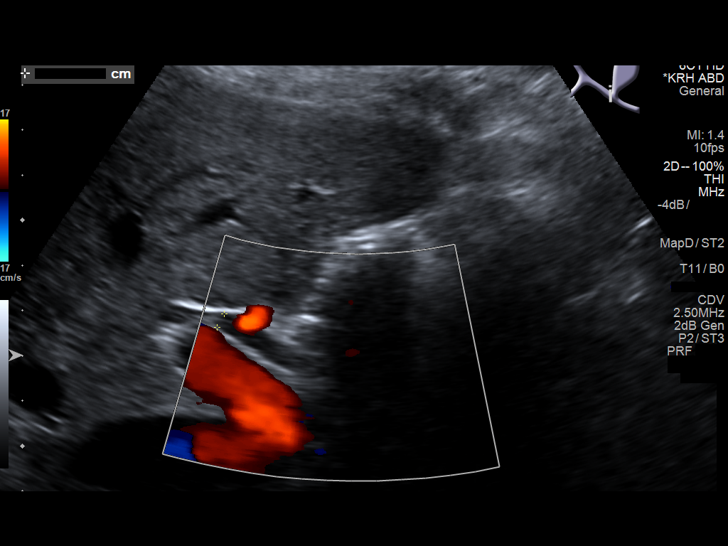
[im 17/31]
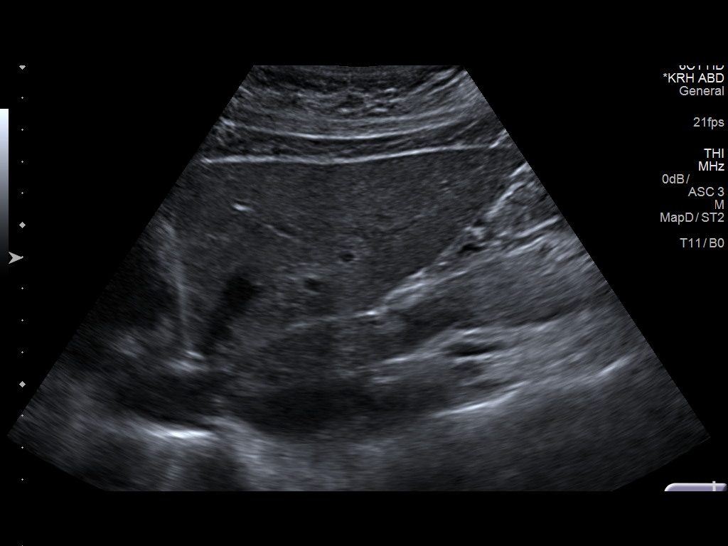
[im 19/31]
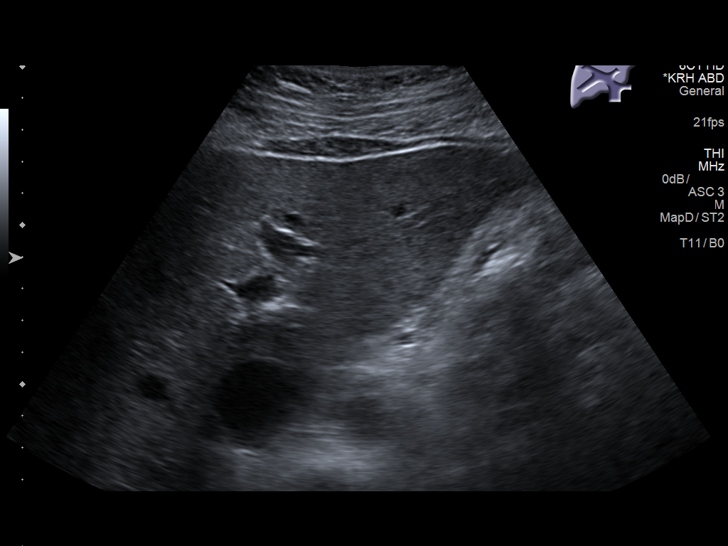
[im 21/31]
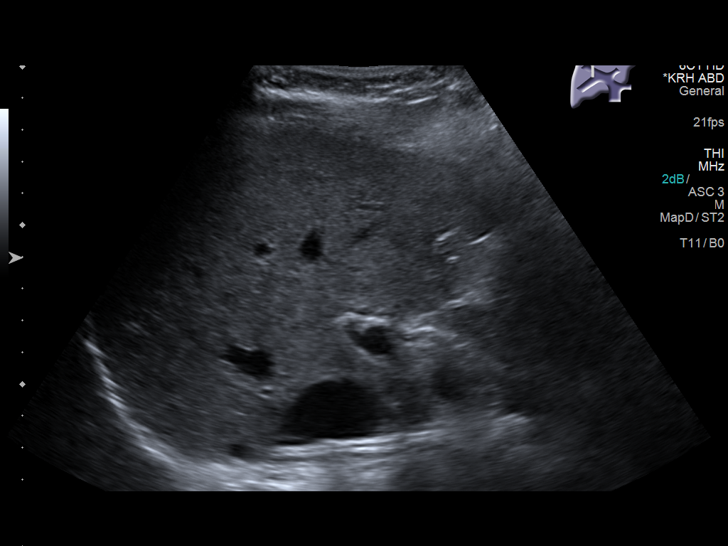
[im 23/31]
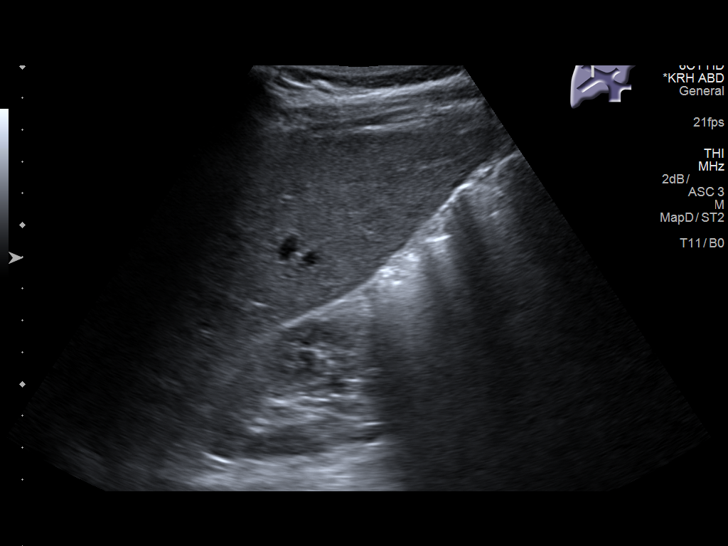
[im 26/31]
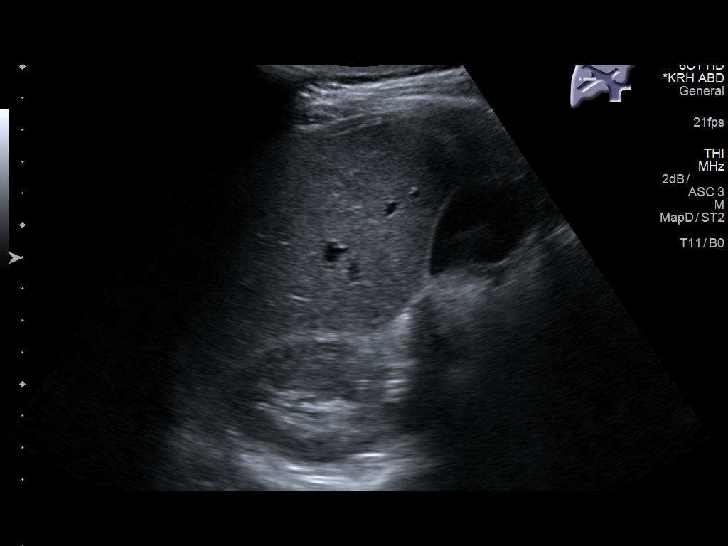
[im 28/31]
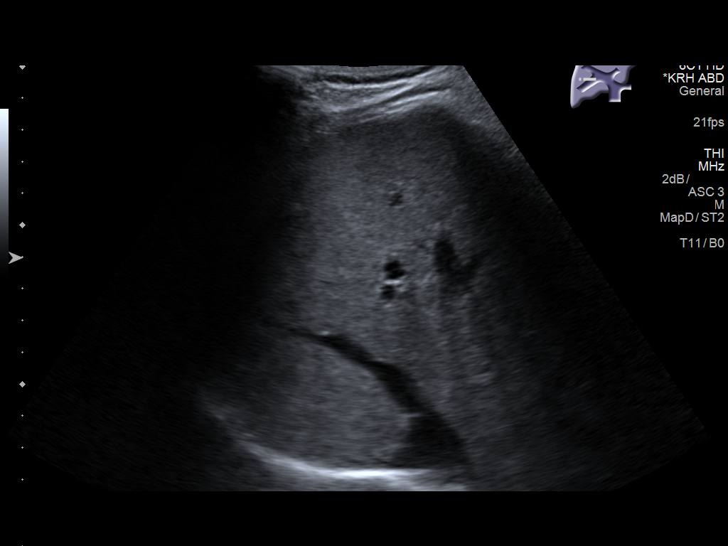
[im 31/31]
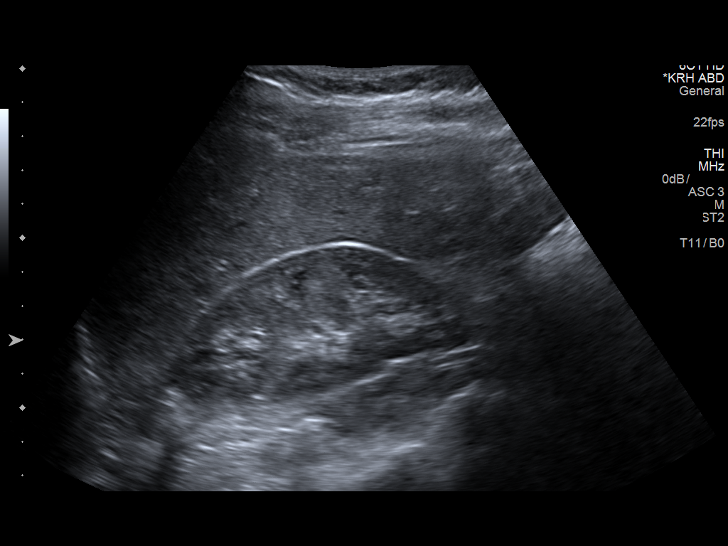

[14 of 25 positions shown; findings below may reference images not displayed]

FINDINGS: Gallbladder:

No gallstones or wall thickening visualized. No sonographic Murphy
sign noted by sonographer.

Common bile duct:

Diameter: 3 mm

Liver:

No focal lesion identified. Within normal limits in parenchymal
echogenicity.
IMPRESSION: Normal right upper quadrant ultrasound examination. If there are
clinical concerns of gallbladder dysfunction, a nuclear medicine
hepatobiliary scan may be useful.

## 2016-10-03 ENCOUNTER — Telehealth: Payer: Self-pay

## 2016-10-03 DIAGNOSIS — B379 Candidiasis, unspecified: Secondary | ICD-10-CM

## 2016-10-03 MED ORDER — FLUCONAZOLE 150 MG PO TABS: 150.0000 mg | ORAL_TABLET | Freq: Once | ORAL | 0 refills | Status: AC

## 2016-10-03 NOTE — Telephone Encounter (Signed)
Lavella Lemons Buckson spoke to pt and said that the pt was explaining symptoms of a yeast infection and said she had a yeast infection. Lavella Lemons Buckson called me and asked me to send in Diflucan. She told pt if it didn't clear up after taking the Diflucan then she would need to be seen in office

## 2017-04-22 ENCOUNTER — Other Ambulatory Visit: Payer: Self-pay

## 2017-04-22 ENCOUNTER — Emergency Department (HOSPITAL_BASED_OUTPATIENT_CLINIC_OR_DEPARTMENT_OTHER)
Admission: EM | Admit: 2017-04-22 | Discharge: 2017-04-22 | Disposition: A | Discharge status: Home / Self Care | Payer: 59 | Attending: Emergency Medicine | Admitting: Emergency Medicine

## 2017-04-22 ENCOUNTER — Encounter (HOSPITAL_BASED_OUTPATIENT_CLINIC_OR_DEPARTMENT_OTHER): Payer: Self-pay | Admitting: *Deleted

## 2017-04-22 DIAGNOSIS — M5412 Radiculopathy, cervical region: Secondary | ICD-10-CM | POA: Insufficient documentation

## 2017-04-22 DIAGNOSIS — M542 Cervicalgia: Secondary | ICD-10-CM | POA: Diagnosis not present

## 2017-04-22 MED ORDER — CYCLOBENZAPRINE HCL 10 MG PO TABS: 10.0000 mg | ORAL_TABLET | Freq: Three times a day (TID) | ORAL | 0 refills | Status: DC | PRN

## 2017-04-22 MED ORDER — NAPROXEN 250 MG PO TABS
500.0000 mg | ORAL_TABLET | Freq: Once | ORAL | Status: AC
  Administered 2017-04-22: 500 mg via ORAL
  Filled 2017-04-22: qty 2

## 2017-04-22 MED ORDER — HYDROCODONE-ACETAMINOPHEN 5-325 MG PO TABS: 1.0000 | ORAL_TABLET | Freq: Four times a day (QID) | ORAL | 0 refills | Status: DC | PRN

## 2017-04-22 NOTE — ED Provider Notes (Addendum)
[facility] Provider Note: Georgena Spurling, MD, FACEP  CSN: [id number] MRN: [id number] ARRIVAL: 04/22/17 at [place]: [place]  Neck Pain   HISTORY OF PRESENT ILLNESS  04/22/17 4:28 AM Susan Roth is a 41 y.o. female who awoke this morning with pain and stiffness in her neck.  The pain originates in the left posterior lateral aspect of her neck and radiates to her left upper back and shoulder.  It is a sharp pain.  It was severe initially but she now rates it as a 5 out of 10.  Her pain is exacerbated with movement of the neck notably rotation of the right neck to the right, leaning of the head to the left and leaning of the head posteriorly.  There is no associated numbness or weakness of the left arm.  The pain does not radiate down the left arm.  She is not aware of injuring her neck.  Consultation with the [facility] state controlled substances database reveals the patient has received no opioid prescriptions in the past 2 years.   Past Medical History:  Diagnosis Date  . Abnormal Pap smear of cervix    cryo  . Allergic rhinitis   . Allergy   . Dysfunction of eustachian tube   . Endometriosis   . Herpes zoster without mention of complication   . IBS (irritable bowel syndrome)   . Kidney calculi   . Obesity     Past Surgical History:  Procedure Laterality Date  . BILATERAL SALPINGECTOMY Right 08/04/2012   Procedure: BILATERAL SALPINGECTOMY;  Surgeon: Emily Filbert, MD;  Location: [facility];  Service: Gynecology;  Laterality: Right;  . BRAIN SURGERY    . CESAREAN SECTION  1998   x1  . COLONOSCOPY    . LAPAROSCOPIC TUBAL LIGATION N/A 08/04/2012   Procedure: LAPAROSCOPIC TUBAL LIGATION;  Surgeon: Emily Filbert, MD;  Location: [facility];  Service: Gynecology;  Laterality: N/A;  Operative lsc for right salpingectomy & Mirena insertion  . TONSILLECTOMY     as an adult - 2011  . WISDOM TOOTH EXTRACTION      Family History  Problem Relation Age  of Onset  . Hyperlipidemia Mother   . Colon polyps Mother   . Nephrolithiasis Mother   . Diverticulosis Mother   . Nephrolithiasis Father   . Depression Father        bipolar  . Colon cancer Father 23  . Asthma Sister   . Colon cancer Paternal Aunt 60  . Colon cancer Paternal Uncle 20       x 2 uncles  . Colon polyps Sister        paternal half sister; Lynch syndrome  . Colon cancer Other        double great uncle from both mom and dads side  . Asthma Sister   . Allergies Sister   . Allergies Brother   . Clotting disorder Maternal Uncle   . Ovarian cancer Maternal Grandmother   . Colon cancer Paternal Aunt   . Diabetes Paternal Aunt     Social History   Tobacco Use  . Smoking status: Never Smoker  . Smokeless tobacco: Never Used  . Tobacco comment: positive hx of passive tobacco smoke exposure  Substance Use Topics  . Alcohol use: Yes    Alcohol/week: 0.6 oz    Types: 1 Glasses of wine per week    Comment: occasionally  . Drug use: No  Prior to Admission medications   Not on File    Allergies Amoxicillin; Penicillins; and Wellbutrin [bupropion hcl]   REVIEW OF SYSTEMS  Negative except as noted here or in the History of Present Illness.   PHYSICAL EXAMINATION  Initial Vital Signs Blood pressure (!) 135/91, pulse 80, temperature 98.1 F (36.7 C), temperature source Oral, resp. rate 16, height 5\' 4"  (1.626 m), weight 87.5 kg (193 lb), last menstrual period 03/23/2017, SpO2 99 %.  Examination General: Well-developed, well-nourished female in no acute distress; appearance consistent with age of record HENT: normocephalic; atraumatic Eyes: pupils equal, round and reactive to light; extraocular muscles intact Neck: supple; rotation of the head to the right, leaning of the head to the left or dorsiflexion of the head reproduces the patient's pain Heart: regular rate and rhythm Lungs: clear to auscultation bilaterally Abdomen: soft; nondistended; nontender;  bowel sounds present Extremities: No deformity; full range of motion; pulses normal; no pain on movement of left shoulder Neurologic: Awake, alert and oriented; motor function intact in all extremities and symmetric; sensation intact in the upper extremities and symmetric; no facial droop Skin: Warm and dry Psychiatric: Normal mood and affect   RESULTS  Summary of this visit's results, reviewed by myself:   EKG Interpretation  Date/Time:    Ventricular Rate:    PR Interval:    QRS Duration:   QT Interval:    QTC Calculation:   R Axis:     Text Interpretation:        Laboratory Studies: No results found for this or any previous visit (from the past 24 hour(s)). Imaging Studies: No results found.  ED COURSE  Nursing notes and initial vitals signs, including pulse oximetry, reviewed.  Vitals:   04/22/17 0420 04/22/17 0424  BP:  (!) 135/91  Pulse:  80  Resp:  16  Temp:  98.1 F (36.7 C)  TempSrc:  Oral  SpO2:  99%  Weight: 87.5 kg (193 lb)   Height: 5\' 4"  (1.626 m)    She is a patient of the [facility] with whom she was advised to follow-up if symptoms worsen or persist.  PROCEDURES    ED DIAGNOSES     ICD-10-CM   1. Cervical radiculopathy M54.12        Kadija Cruzen, Jenny Reichmann, MD 04/22/17 0441    Shanon Rosser, MD 04/22/17 0442    Shanon Rosser, MD 04/22/17 [phone]

## 2017-04-22 NOTE — ED Triage Notes (Addendum)
Pt c/o left  posterior neck pain that started yesterday morning. C/o pain with turning head side to side. Denies any recent illness. Has not taken anything at home for pain. States pain is worse this morning. Pt drove self to the ED. Denies any injury

## 2017-07-15 ENCOUNTER — Encounter: Payer: Self-pay | Admitting: Obstetrics & Gynecology

## 2017-07-15 ENCOUNTER — Ambulatory Visit (INDEPENDENT_AMBULATORY_CARE_PROVIDER_SITE_OTHER): Payer: 59 | Admitting: Obstetrics & Gynecology

## 2017-07-15 VITALS — BP 120/70 | HR 78 | Resp 16 | Ht 64.0 in | Wt 201.0 lb

## 2017-07-15 DIAGNOSIS — Z01419 Encounter for gynecological examination (general) (routine) without abnormal findings: Secondary | ICD-10-CM

## 2017-07-15 DIAGNOSIS — Z0000 Encounter for general adult medical examination without abnormal findings: Secondary | ICD-10-CM

## 2017-07-15 MED ORDER — MEGESTROL ACETATE 40 MG PO TABS: 40.0000 mg | ORAL_TABLET | Freq: Two times a day (BID) | ORAL | 5 refills | Status: DC

## 2017-07-15 NOTE — Progress Notes (Signed)
   Subjective:    Patient ID: Susan Roth, female    DOB: 03-27-1976, 42 y.o.   MRN: [id number]  HPI 42 yo lady P14 (85 yo daughter) here today because she is going on vacation to [place] and her period is due and she would like to prevent blood during her vacation. Her tubes were removed several years ago.  Review of Systems  12/16 NEGATIVE FOR INTRAEPITHELIAL LESIONS OR MALIGNANCY. BENIGN REACTIVE/REPARATIVE CHANGES.    Objective:   Physical Exam  Breathing, conversing, and ambulating normally Well nourished, well hydrated Black female, no apparent distress    Assessment & Plan:  Preventative care- mammogram ordered Menstrual regulation- megace 40 mg BID prn

## 2017-07-26 ENCOUNTER — Ambulatory Visit (INDEPENDENT_AMBULATORY_CARE_PROVIDER_SITE_OTHER): Payer: 59

## 2017-07-26 DIAGNOSIS — R928 Other abnormal and inconclusive findings on diagnostic imaging of breast: Secondary | ICD-10-CM | POA: Diagnosis not present

## 2017-07-26 DIAGNOSIS — Z0000 Encounter for general adult medical examination without abnormal findings: Secondary | ICD-10-CM

## 2017-07-29 ENCOUNTER — Telehealth: Payer: Self-pay | Admitting: Obstetrics & Gynecology

## 2017-07-29 ENCOUNTER — Telehealth: Payer: Self-pay | Admitting: *Deleted

## 2017-07-29 MED ORDER — FLUCONAZOLE 150 MG PO TABS: 150.0000 mg | ORAL_TABLET | Freq: Once | ORAL | 0 refills | Status: AC

## 2017-07-29 NOTE — Telephone Encounter (Addendum)
Pt called states has taken four pills of megestrol. States she stopped taking it due to her period lasting ten days and her thighs broke out. Pt also thinks she may have a yeast infection. Please advise [phone].

## 2017-07-29 NOTE — Telephone Encounter (Signed)
Pt called stating that she has returned from [facility].  She only took 4 Megace and noticed a rash on chest and legs that itched.  She stated that she did not wear sunscreen while in [place] and could be sunburn because the skin is peeling.  She also states that she is having vaginal itching with d/c.  Will send in Diflucan 150 mg but if not any improvement in 48 hrs she would need an appointment.

## 2017-07-30 ENCOUNTER — Other Ambulatory Visit (HOSPITAL_COMMUNITY): Payer: Self-pay | Admitting: Obstetrics & Gynecology

## 2017-07-30 DIAGNOSIS — R928 Other abnormal and inconclusive findings on diagnostic imaging of breast: Secondary | ICD-10-CM

## 2017-08-09 ENCOUNTER — Ambulatory Visit
Admission: RE | Admit: 2017-08-09 | Discharge: 2017-08-09 | Disposition: A | Discharge status: Home / Self Care | Payer: 59 | Source: Ambulatory Visit | Attending: Obstetrics & Gynecology | Admitting: Obstetrics & Gynecology

## 2017-08-09 DIAGNOSIS — R928 Other abnormal and inconclusive findings on diagnostic imaging of breast: Secondary | ICD-10-CM

## 2017-10-23 ENCOUNTER — Other Ambulatory Visit: Payer: Self-pay

## 2017-10-23 ENCOUNTER — Encounter (HOSPITAL_BASED_OUTPATIENT_CLINIC_OR_DEPARTMENT_OTHER): Payer: Self-pay | Admitting: Emergency Medicine

## 2017-10-23 ENCOUNTER — Emergency Department (HOSPITAL_BASED_OUTPATIENT_CLINIC_OR_DEPARTMENT_OTHER)
Admission: EM | Admit: 2017-10-23 | Discharge: 2017-10-23 | Disposition: A | Discharge status: Home / Self Care | Payer: 59 | Attending: Emergency Medicine | Admitting: Emergency Medicine

## 2017-10-23 ENCOUNTER — Emergency Department (HOSPITAL_BASED_OUTPATIENT_CLINIC_OR_DEPARTMENT_OTHER): Payer: 59

## 2017-10-23 DIAGNOSIS — M79604 Pain in right leg: Secondary | ICD-10-CM | POA: Diagnosis not present

## 2017-10-23 DIAGNOSIS — R600 Localized edema: Secondary | ICD-10-CM | POA: Insufficient documentation

## 2017-10-23 DIAGNOSIS — M79605 Pain in left leg: Secondary | ICD-10-CM | POA: Insufficient documentation

## 2017-10-23 DIAGNOSIS — R609 Edema, unspecified: Secondary | ICD-10-CM

## 2017-10-23 DIAGNOSIS — Z3202 Encounter for pregnancy test, result negative: Secondary | ICD-10-CM | POA: Diagnosis not present

## 2017-10-23 DIAGNOSIS — Z79899 Other long term (current) drug therapy: Secondary | ICD-10-CM | POA: Insufficient documentation

## 2017-10-23 LAB — COMPREHENSIVE METABOLIC PANEL
ALK PHOS: 56 U/L (ref 38–126)
ALT: 18 U/L (ref 14–54)
ANION GAP: 7 (ref 5–15)
AST: 16 U/L (ref 15–41)
Albumin: 4.1 g/dL (ref 3.5–5.0)
BUN: 15 mg/dL (ref 6–20)
CALCIUM: 8.8 mg/dL — AB (ref 8.9–10.3)
CHLORIDE: 103 mmol/L (ref 101–111)
CO2: 28 mmol/L (ref 22–32)
Creatinine, Ser: 0.83 mg/dL (ref 0.44–1.00)
GFR calc non Af Amer: >60 mL/min (ref >60)
GLUCOSE: 93 mg/dL (ref 65–99)
Potassium: 4.2 mmol/L (ref 3.5–5.1)
SODIUM: 138 mmol/L (ref 135–145)
Total Bilirubin: 0.5 mg/dL (ref 0.3–1.2)
Total Protein: 7.4 g/dL (ref 6.5–8.1)

## 2017-10-23 LAB — CBC WITH DIFFERENTIAL/PLATELET
BASOS PCT: 0 %
Basophils Absolute: 0 10*3/uL (ref 0.0–0.1)
Eosinophils Absolute: 0.1 10*3/uL (ref 0.0–0.7)
Eosinophils Relative: 2 %
HEMATOCRIT: 41.4 % (ref 36.0–46.0)
HEMOGLOBIN: 14.6 g/dL (ref 12.0–15.0)
LYMPHS ABS: 1.5 10*3/uL (ref 0.7–4.0)
LYMPHS PCT: 48 %
MCH: 31.8 pg (ref 26.0–34.0)
MCHC: 35.3 g/dL (ref 30.0–36.0)
MCV: 90.2 fL (ref 78.0–100.0)
MONO ABS: 0.3 10*3/uL (ref 0.1–1.0)
MONOS PCT: 9 %
NEUTROS ABS: 1.3 10*3/uL — AB (ref 1.7–7.7)
NEUTROS PCT: 41 %
Platelets: 270 10*3/uL (ref 150–400)
RBC: 4.59 MIL/uL (ref 3.87–5.11)
RDW: 11.3 % — AB (ref 11.5–15.5)
WBC: 3.2 10*3/uL — ABNORMAL LOW (ref 4.0–10.5)

## 2017-10-23 LAB — PREGNANCY, URINE: Preg Test, Ur: NEGATIVE

## 2017-10-23 NOTE — ED Provider Notes (Signed)
[facility] EMERGENCY DEPARTMENT Provider Note   CSN: [id number] Arrival date & time: 10/23/17  0093     History   Chief Complaint Chief Complaint  Patient presents with  . Leg Pain    HPI Susan Roth is a 42 y.o. female.  The history is provided by the patient. No language interpreter was used.  Leg Pain      Susan Roth is a 42 y.o. female who presents to the Emergency Department complaining of leg pain. She presents to the emergency department for evaluation several months of bilateral lower extremity swelling and pain. She has pain throughout both feet and calves. Pain has worsened over the last two weeks after doing a Zumba class with her mother. She denies any fevers, chest pain, shortness of breath, vomiting, abdominal pain, numbness, weakness. Over the last few days she has experienced nausea with indigestion and decreased appetite with mild diarrhea. She has a history of endometriosis status post ablation salpingectomy. She is not on any medications. She does not smoke. She uses occasional alcohol. No street drugs. She has a family history of Lynch syndrome and her father and her siblings but she has been tested negative for this.  Past Medical History:  Diagnosis Date  . Abnormal Pap smear of cervix    cryo  . Allergic rhinitis   . Allergy   . Dysfunction of eustachian tube   . Endometriosis   . Herpes zoster without mention of complication   . IBS (irritable bowel syndrome)   . Kidney calculi   . Obesity     Patient Active Problem List   Diagnosis Date Noted  . Routine general medical examination at a health care facility 09/22/2015  . 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 Right flank pain 09/07/2015  . Scalp irritation 07/01/2015  . Low back pain 12/06/2014  . RUQ discomfort 07/20/2014  . Sinusitis, acute 04/06/2014  . Paresthesia 02/09/2013  . Urethritis 12/30/2012  . IBS (irritable bowel syndrome) 11/13/2012  . Admission for sterilization  08/04/2012  . Dysmenorrhea 08/04/2012  . Obesity 06/25/2012  . 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 Anxiety and depression 09/26/2010  . ACNE, MILD 09/27/2008  . HERPES ZOSTER 09/17/2008  . Allergic rhinitis due to other allergen 09/17/2008    Past Surgical History:  Procedure Laterality Date  . BILATERAL SALPINGECTOMY Right 08/04/2012   Procedure: BILATERAL SALPINGECTOMY;  Surgeon: Emily Filbert, MD;  Location: [facility];  Service: Gynecology;  Laterality: Right;  . BRAIN SURGERY    . CESAREAN SECTION  1998   x1  . COLONOSCOPY    . LAPAROSCOPIC TUBAL LIGATION N/A 08/04/2012   Procedure: LAPAROSCOPIC TUBAL LIGATION;  Surgeon: Emily Filbert, MD;  Location: [facility];  Service: Gynecology;  Laterality: N/A;  Operative lsc for right salpingectomy & Mirena insertion  . TONSILLECTOMY     as an adult - 2011  . WISDOM TOOTH EXTRACTION       OB History    Gravida  1   Para  1   Term  0   Preterm  1   AB  0   Living  1     SAB  0   TAB  0   Ectopic  0   Multiple  0   Live Births  1            Home Medications    Prior to Admission medications   Medication Sig Start Date End Date Taking? Authorizing Provider  megestrol (MEGACE) 40 MG tablet Take 1 tablet (40 mg total) by mouth 2 (two) times daily. 07/15/17   Emily Filbert, MD    Family History Family History  Problem Relation Age of Onset  . Hyperlipidemia Mother   . Colon polyps Mother   . Nephrolithiasis Mother   . Diverticulosis Mother   . Nephrolithiasis Father   . Depression Father        bipolar  . Colon cancer Father 77  . Asthma Sister   . Colon cancer Paternal Aunt 47  . Colon cancer Paternal Uncle 48       x 2 uncles  . Colon polyps Sister        paternal half sister; Lynch syndrome  . Colon cancer Other        double great uncle from both mom and dads side  . Asthma Sister   . Allergies Sister   . Allergies Brother   . Clotting disorder Maternal Uncle   . Ovarian cancer Maternal Grandmother   .  Colon cancer Paternal Aunt   . Diabetes Paternal Aunt     Social History Social History   Tobacco Use  . Smoking status: Never Smoker  . Smokeless tobacco: Never Used  . Tobacco comment: positive hx of passive tobacco smoke exposure  Substance Use Topics  . Alcohol use: Yes    Alcohol/week: 0.6 oz    Types: 1 Glasses of wine per week    Comment: occasionally  . Drug use: No     Allergies   Amoxicillin; Penicillins; and Wellbutrin [bupropion hcl]   Review of Systems Review of Systems  All other systems reviewed and are negative.    Physical Exam Updated Vital Signs BP (!) 139/91   Pulse 77   Temp 98.4 F (36.9 C) (Oral)   Resp 18   Ht 5\' 4"  (1.626 m)   Wt 90.7 kg (200 lb)   LMP 10/12/2017   SpO2 100%   BMI 34.33 kg/m   Physical Exam  Constitutional: She is oriented to person, place, and time. She appears well-developed and well-nourished.  HENT:  Head: Normocephalic and atraumatic.  Cardiovascular: Normal rate and regular rhythm.  No murmur heard. Pulmonary/Chest: Effort normal and breath sounds normal. No respiratory distress.  Abdominal: Soft. There is no tenderness. There is no rebound and no guarding.  Musculoskeletal:  2+ DP pulses bilaterally. No erythema or deformities to bilateral lower extremities there is mild generalized tenderness throughout bilateral legs and feet. No significant edema to bilateral lower extremities.  Neurological: She is alert and oriented to person, place, and time.  5/5 strength in all four extremities with sensation to light touch intact in all four extremities.   Skin: Skin is warm and dry.  Psychiatric: She has a normal mood and affect. Her behavior is normal.  Nursing note and vitals reviewed.    ED Treatments / Results  Labs (all labs ordered are listed, but only abnormal results are displayed) Labs Reviewed  COMPREHENSIVE METABOLIC PANEL - Abnormal; Notable for the following components:      Result Value    Calcium 8.8 (*)    All other components within normal limits  CBC WITH DIFFERENTIAL/PLATELET - Abnormal; Notable for the following components:   WBC 3.2 (*)    RDW 11.3 (*)    Neutro Abs 1.3 (*)    All other components within normal limits  PREGNANCY, URINE    EKG None  Radiology Dg Chest 2 View  Result  Date: 10/23/2017 CLINICAL DATA:  Chronic bilateral lower extremity swelling and foot pain with increased severity over the past week. Nonsmoker. EXAM: CHEST - 2 VIEW COMPARISON:  PA and lateral chest x-ray of December 26, 2014 FINDINGS: The lungs are well-expanded and clear. The heart and pulmonary vascularity are normal. The mediastinum is normal in width. There is no pleural effusion. The bony thorax exhibits no acute abnormality. IMPRESSION: There is no active cardiopulmonary disease. Electronically Signed   By: David  Martinique M.D.   On: 10/23/2017 08:13    Procedures Procedures (including critical care time)  Medications Ordered in ED Medications - No data to display   Initial Impression / Assessment and Plan / ED Course  I have reviewed the triage vital signs and the nursing notes.  Pertinent labs & imaging results that were available during my care of the patient were reviewed by me and considered in my medical decision making (see chart for details).     Patient here for evaluation of progressive bilateral lower extremity pain and edema. She is neurovascular intact on examination. Presentation is not consistent with limb ischemia, DVT, CHF, cellulitis. Labs without evidence of renal failure or anemia. Discussed with patient home care for peripheral edema with elevation, compressive stockings, diet modifications. Discussed outpatient follow-up and return precautions. Final Clinical Impressions(s) / ED Diagnoses   Final diagnoses:  Peripheral edema  Bilateral leg pain    ED Discharge Orders    None       Quintella Reichert, MD 10/23/17 [phone]

## 2017-10-23 NOTE — ED Triage Notes (Signed)
Pt c/o bilateral leg swollen and pain 6/10 constantly for the past week getting worse today. Pt denies any injury.

## 2017-10-23 NOTE — ED Notes (Signed)
NAD at this time, Pt is stable and going home.

## 2017-10-31 ENCOUNTER — Encounter: Payer: Self-pay | Admitting: Obstetrics & Gynecology

## 2017-10-31 ENCOUNTER — Ambulatory Visit (INDEPENDENT_AMBULATORY_CARE_PROVIDER_SITE_OTHER): Payer: 59 | Admitting: Obstetrics & Gynecology

## 2017-11-04 NOTE — Progress Notes (Signed)
   Subjective:    Patient ID: Susan Roth, female    DOB: February 11, 1976, 42 y.o.   MRN: [id number]  HPI  43 yo P1 here to review her  Review of Systems     Objective:   Physical Exam        Assessment & Plan:

## 2018-01-10 ENCOUNTER — Ambulatory Visit (INDEPENDENT_AMBULATORY_CARE_PROVIDER_SITE_OTHER): Payer: 59 | Admitting: Medical

## 2018-01-10 ENCOUNTER — Encounter: Payer: Self-pay | Admitting: Medical

## 2018-01-10 VITALS — BP 125/68 | HR 76 | Temp 97.6°F | Resp 16 | Ht 64.0 in | Wt 207.4 lb

## 2018-01-10 DIAGNOSIS — J309 Allergic rhinitis, unspecified: Secondary | ICD-10-CM | POA: Diagnosis not present

## 2018-01-10 DIAGNOSIS — H9201 Otalgia, right ear: Secondary | ICD-10-CM

## 2018-01-10 DIAGNOSIS — R1011 Right upper quadrant pain: Secondary | ICD-10-CM

## 2018-01-10 DIAGNOSIS — K8020 Calculus of gallbladder without cholecystitis without obstruction: Secondary | ICD-10-CM | POA: Diagnosis not present

## 2018-01-10 MED ORDER — AZITHROMYCIN 250 MG PO TABS: ORAL_TABLET | ORAL | 0 refills | Status: DC

## 2018-01-10 MED ORDER — RANITIDINE HCL 150 MG PO TABS: 150.0000 mg | ORAL_TABLET | Freq: Two times a day (BID) | ORAL | 0 refills | Status: DC

## 2018-01-10 MED ORDER — FLUTICASONE PROPIONATE 50 MCG/ACT NA SUSP: 2.0000 | Freq: Every day | NASAL | 6 refills | Status: DC

## 2018-01-10 MED ORDER — LEVOCETIRIZINE DIHYDROCHLORIDE 5 MG PO TABS: 5.0000 mg | ORAL_TABLET | Freq: Every evening | ORAL | 3 refills | Status: DC

## 2018-01-10 NOTE — Patient Instructions (Signed)
For your history of recent gallstones, I did go ahead and place referral to general surgeon.  Recommend healthy diet and avoid fatty/greasy foods.  We did discuss today potential complications of gallstones and if you have presentation as such then would recommend ED evaluation pending referral to the general surgeon.  Some of your symptoms appear to be possible reflux type symptoms so I did prescribe you ranitidine.  For probable allergic rhinitis, I did prescribe Xyzal and Flonase.  Your right ear on exam does not look infected but if your pain worsens over the weekend then would recommend going ahead and starting a azithromycin antibiotic.  Follow-up in 3 to 4 weeks or as needed.
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
## 2018-01-24 NOTE — Progress Notes (Signed)
Please place orders in Epic as patient is being scheduled for a pre-op appointment! Thank you! 

## 2018-01-27 ENCOUNTER — Encounter: Payer: Self-pay | Admitting: Family Medicine

## 2018-01-27 ENCOUNTER — Ambulatory Visit (INDEPENDENT_AMBULATORY_CARE_PROVIDER_SITE_OTHER): Payer: 59 | Admitting: Family Medicine

## 2018-01-27 VITALS — BP 112/80 | HR 77 | Temp 98.5°F | Resp 16 | Ht 64.0 in | Wt 206.0 lb

## 2018-01-27 DIAGNOSIS — N920 Excessive and frequent menstruation with regular cycle: Secondary | ICD-10-CM

## 2018-01-27 DIAGNOSIS — Z23 Encounter for immunization: Secondary | ICD-10-CM

## 2018-01-27 DIAGNOSIS — K8020 Calculus of gallbladder without cholecystitis without obstruction: Secondary | ICD-10-CM

## 2018-01-27 NOTE — Progress Notes (Signed)
[place] [facility] [address] [place], [place] [address] [phone]  Date:  01/27/2018   Name:  Susan Roth   DOB:  Jan 17, 1976   MRN:  [id number]  PCP:  Susan Mclean, MD    Chief Complaint: New Patient (Initial Visit) (Gallbladder removal wednesday, vaginal bleeding, heavy menstral, painful)   History of Present Illness:  Susan Roth is a 42 y.o. very pleasant female patient who presents with the following:  She is here today to establish care now that Susan Roth is going to pulmonology only  She works for [employer]- in [employer].  She has done this for 16 years.  However she is being laid off at the end of this month and will potentially be without insurance for a time while she finds a new job  This makes her situation as below more acute- she is not sure if she will be uninsured for a few months and does not want to have an emergency gallbladder situation  Her daughter is 32 yo and in college at [employer] state In her free time she likes to spend time with her BF- they live together They also have a dog  She is having her gallbladder out on Wednesday- however she does not necessarily want to do this, she is not sure that her gallbladder is causing her bothersome abdominal symptoms  She went to the ER last month and was dx with possible gallstones on CT  She then went to see general surgery and they suggested a cholecystectomy.  This is scheduled for this week She has noted RUQ pains off and on for years, and has often noticed that her pain seems to follow her monthly menstrual cycle.  This made her wonder if the issue was ovarian perhaps On the other hand she is afraid to NOT have her GB out in case she might need emergency surgery   She sees Dr. Hulan Roth for her GYN care- saw her in June They did do an ex-lap and was dx with endometriosis- however it was not severe and they were able to remove the concerning  tissue with a salpingectomy  Menses started yesterday She has noted more heavy menses over the last several months, and may have pain and cramping Discussed possibly starting her on OCP for this once her gallbladder is out   Patient Active Problem List   Diagnosis Date Noted  . Routine general medical examination at a health care facility 09/22/2015  . 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 Right flank pain 09/07/2015  . Scalp irritation 07/01/2015  . Low back pain 12/06/2014  . RUQ discomfort 07/20/2014  . Sinusitis, acute 04/06/2014  . Paresthesia 02/09/2013  . Urethritis 12/30/2012  . IBS (irritable bowel syndrome) 11/13/2012  . Admission for sterilization 08/04/2012  . Dysmenorrhea 08/04/2012  . Obesity 06/25/2012  . 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 Anxiety and depression 09/26/2010  . ACNE, MILD 09/27/2008  . HERPES ZOSTER 09/17/2008  . Allergic rhinitis due to other allergen 09/17/2008    Past Medical History:  Diagnosis Date  . Abnormal Pap smear of cervix    cryo  . Allergic rhinitis   . Allergy   . Dysfunction of eustachian tube   . Endometriosis   . Herpes zoster without mention of complication   . IBS (irritable bowel syndrome)   . Kidney calculi   . Obesity     Past Surgical  History:  Procedure Laterality Date  . BILATERAL SALPINGECTOMY Right 08/04/2012   Procedure: BILATERAL SALPINGECTOMY;  Surgeon: Susan Filbert, MD;  Location: [facility];  Service: Gynecology;  Laterality: Right;  . BRAIN SURGERY    . CESAREAN SECTION  1998   x1  . COLONOSCOPY    . LAPAROSCOPIC TUBAL LIGATION N/A 08/04/2012   Procedure: LAPAROSCOPIC TUBAL LIGATION;  Surgeon: Susan Filbert, MD;  Location: [facility];  Service: Gynecology;  Laterality: N/A;  Operative lsc for right salpingectomy & Mirena insertion  . TONSILLECTOMY     as an adult - 2011  . WISDOM TOOTH EXTRACTION      Social History   Tobacco Use  . Smoking status: Never Smoker  . Smokeless tobacco: Never Used  . Tobacco  comment: positive hx of passive tobacco smoke exposure  Substance Use Topics  . Alcohol use: Yes    Alcohol/week: 1.0 standard drinks    Types: 1 Glasses of wine per week    Comment: occasionally  . Drug use: No    Family History  Problem Relation Age of Onset  . Hyperlipidemia Mother   . Colon polyps Mother   . Nephrolithiasis Mother   . Diverticulosis Mother   . Nephrolithiasis Father   . Depression Father        bipolar  . Colon cancer Father 66  . Asthma Sister   . Colon cancer Paternal Aunt 39  . Colon cancer Paternal Uncle 36       x 2 uncles  . Colon polyps Sister        paternal half sister; Lynch syndrome  . Colon cancer Other        double great uncle from both mom and dads side  . Asthma Sister   . Allergies Sister   . Allergies Brother   . Clotting disorder Maternal Uncle   . Ovarian cancer Maternal Grandmother   . Colon cancer Paternal Aunt   . Diabetes Paternal Aunt     Allergies  Allergen Reactions  . Amoxicillin Hives  . Penicillins Hives  . Wellbutrin [Bupropion Hcl] Hives    Medication list has been reviewed and updated.  Current Outpatient Medications on File Prior to Visit  Medication Sig Dispense Refill  . naproxen sodium (ALEVE) 220 MG tablet Take 220 mg by mouth.     No current facility-administered medications on file prior to visit.     Review of Systems:  As per HPI- otherwise negative. No fever or chills No other concerns today   Physical Examination: Vitals:   01/27/18 1033  BP: 112/80  Pulse: 77  Resp: 16  Temp: 98.5 F (36.9 C)  SpO2: 99%   Vitals:   01/27/18 1033  Weight: 206 lb (93.4 kg)  Height: 5\' 4"  (1.626 m)   Body mass index is 35.36 kg/m. Ideal Body Weight: Weight in (lb) to have BMI = 25: 145.3  GEN: WDWN, NAD, Non-toxic, A & O x 3, overweight, looks well  HEENT: Atraumatic, Normocephalic. Neck supple. No masses, No LAD. Ears and Nose: No external deformity. CV: RRR, No M/G/R. No JVD. No thrill. No  extra heart sounds. PULM: CTA B, no wheezes, crackles, rhonchi. No retractions. No resp. distress. No accessory muscle use. ABD: S, ND, +BS. No rebound. No HSM.  She notes mild RUQ tenderness which has been present for a month per her report  EXTR: No c/c/e NEURO Normal gait.  PSYCH: Normally interactive. Conversant. Not depressed or anxious appearing.  Calm  demeanor.    Assessment and Plan: Gallstones  Need for influenza vaccination - Plan: Flu Vaccine QUAD 6+ mos PF IM (Fluarix Quad PF)  Menorrhagia with regular cycle  Discussed in detail with pt today I cannot tell her if she should have her GB out- however we did discuss the potential danger of acute cholecyctitis during the time when she may be uninsured.  Likely she will go ahead and have the surgery Flu shot given   Signed Lamar Blinks, MD

## 2018-01-27 NOTE — Patient Instructions (Signed)
It was nice to meet you today!  Best of luck with your operation.   Let me know how you do after this and we can think about some birth control pills to suppress your bleeding and cycle pain if need be

## 2018-01-27 NOTE — Progress Notes (Signed)
Please place orders in Epic as patient has a pre-op appointment on 01/29/2018! Thank you!

## 2018-01-28 ENCOUNTER — Ambulatory Visit: Payer: Self-pay | Admitting: General Surgery

## 2018-01-28 ENCOUNTER — Encounter (HOSPITAL_COMMUNITY): Payer: Self-pay

## 2018-01-28 NOTE — H&P (View-Only) (Signed)
Susan Roth Documented: 01/24/2018 9:22 AM Location: [facility] Patient #: [id number] DOB: 07/19/1975 Separated / Language: Susan Roth / Race: Black or African American Female   History of Present Illness Susan Roth M. Susan Haymaker MD; 01/24/2018 9:57 AM) The patient is a 42 year old female who presents for evaluation of gall stones. She is referred by Susan Pai PA for evaluation of gallstones. She states for about a year she has had intermittent episodes of right upper quadrant pain. It will sometimes radiate to between her shoulder blades. She may be having some more frequent episodes of the past month. None of the episodes are severe pain. She describes it as a discomfort that she can tolerate. Episodes generally last about 2-3 hours. She will have some nausea occasionally with that as well as bloating. She has noticed that when she has her cycle she'll have an increased number of episodes. She does endorse eating a fair amount of fast food. She tends to notice episodes more in the evening. She also complains of some heartburn. She ended up going to urgent care in mid August and subsequently sent to the ER at [facility]. She had an ultrasound which showed debris in the gallbladder neck. She had renal stones CT scan which showed nonobstructing right kidney stone as well as stones and sludge in the gallbladder. Her labs were unremarkable. She has been intermittently on phentermine in attempts to lose weight. She states that when she does lose weight rapidly she will also noticed increased number of episodes. He denies any acholic stools. She has had some laparoscopic surgery for endometriosis as well as tube removal. She denies any fevers or chills.   Problem List/Past Medical Susan Roth M. Susan Pulling, MD; 01/24/2018 9:58 AM) SYMPTOMATIC CHOLELITHIASIS (K80.20)   Past Surgical History (Susan Roth, Susan Roth; 01/24/2018 9:23 AM) Carotid Artery Surgery  Right. Oral Surgery   Tonsillectomy   Diagnostic Studies History (Susan Roth, Susan Roth; 01/24/2018 9:23 AM) Colonoscopy  5-10 years ago Mammogram  within last year Pap Smear  1-5 years ago  Allergies (Susan Roth, Susan Roth; 01/24/2018 9:23 AM) No Known Drug Allergies [01/24/2018]:  Medication History (Susan Roth, Susan Roth; 01/24/2018 9:23 AM) Dicyclomine HCl (20MG  Tablet, Oral) Active. Phentermine HCl (37.5MG  Tablet, Oral) Active. Medications Reconciled  Social History (Susan Roth, Susan Roth; 01/24/2018 9:23 AM) Alcohol use  Moderate alcohol use. Caffeine use  Carbonated beverages, Coffee.  Family History (Susan Roth, Susan Roth; 01/24/2018 9:23 AM) Cancer  Father. Colon Cancer  Father. Colon Polyps  Father, Mother. Ischemic Bowel Disease  Mother. Respiratory Condition  Father.  Pregnancy / Birth History (Susan Roth, Susan Roth; 01/24/2018 9:23 AM) Age at menarche  12 years. Age of menopause  <45 Gravida  1 Maternal age  24-25 Para  1 Regular periods   Other Problems Susan Roth M. Susan Pulling, MD; 01/24/2018 9:58 AM) Back Pain  Cholelithiasis  Gastroesophageal Reflux Disease  Kidney Stone     Review of Systems (Susan A. Brown Susan Roth; 01/24/2018 9:23 AM) Skin Not Present- Change in Wart/Mole, Dryness, Hives, Jaundice, New Lesions, Non-Healing Wounds, Rash and Ulcer. HEENT Present- Seasonal Allergies and Sinus Pain. Not Present- Earache, Hearing Loss, Hoarseness, Nose Bleed, Oral Ulcers, Ringing in the Ears, Sore Throat, Visual Disturbances, Wears glasses/contact lenses and Yellow Eyes. Respiratory Not Present- Bloody sputum, Chronic Cough, Difficulty Breathing, Snoring and Wheezing. Breast Not Present- Breast Mass, Breast Pain, Nipple Discharge and Skin Changes. Cardiovascular Present- Shortness of Breath. Not Present- Chest Pain, Difficulty Breathing Lying Down, Leg Cramps, Palpitations, Rapid  Heart Rate and Swelling of Extremities. Gastrointestinal Present- Abdominal Pain, Bloating,  Constipation, Indigestion and Nausea. Not Present- Bloody Stool, Change in Bowel Habits, Chronic diarrhea, Difficulty Swallowing, Excessive gas, Gets full quickly at meals, Hemorrhoids, Rectal Pain and Vomiting. Musculoskeletal Present- Back Pain and Joint Stiffness. Not Present- Joint Pain, Muscle Pain, Muscle Weakness and Swelling of Extremities. Psychiatric Not Present- Anxiety, Bipolar, Change in Sleep Pattern, Depression, Fearful and Frequent crying. Endocrine Not Present- Cold Intolerance, Excessive Hunger, Hair Changes, Heat Intolerance, Hot flashes and New Diabetes. Hematology Not Present- Blood Thinners, Easy Bruising, Excessive bleeding, Gland problems, HIV and Persistent Infections.  Vitals (Susan A. Brown Susan Roth; 01/24/2018 9:23 AM) 01/24/2018 9:23 AM Weight: 206.8 lb Height: 64in Body Surface Area: 1.98 m Body Mass Index: 35.5 kg/m  Temp.: 97.62F  BP: 128/82 (Sitting, Left Arm, Standard)       Physical Exam Susan Roth M. Susan Wunschel MD; 01/24/2018 9:57 AM) General Mental Status-Alert. General Appearance-Consistent with stated age. Hydration-Well hydrated. Voice-Normal.  Head and Neck Head-normocephalic, atraumatic with no lesions or palpable masses. Trachea-midline. Thyroid Gland Characteristics - normal size and consistency.  Eye Eyeball - Bilateral-Extraocular movements intact. Sclera/Conjunctiva - Bilateral-No scleral icterus.  ENMT Ears -Note: normal external ears.  Mouth and Throat -Note: lips intact.   Chest and Lung Exam Chest and lung exam reveals -quiet, even and easy respiratory effort with no use of accessory muscles and on auscultation, normal breath sounds, no adventitious sounds and normal vocal resonance. Inspection Chest Wall - Normal. Back - normal.  Breast - Did not examine.  Cardiovascular Cardiovascular examination reveals -normal heart sounds, regular rate and rhythm with no murmurs and normal pedal pulses  bilaterally.  Abdomen Inspection Inspection of the abdomen reveals - No Hernias. Skin - Scar - Note: well healed trocar scars. Palpation/Percussion Palpation and Percussion of the abdomen reveal - Soft, Non Tender, No Rebound tenderness, No Rigidity (guarding) and No hepatosplenomegaly. Auscultation Auscultation of the abdomen reveals - Bowel sounds normal.  Peripheral Vascular Upper Extremity Palpation - Pulses bilaterally normal.  Neurologic Neurologic evaluation reveals -alert and oriented x 3 with no impairment of recent or remote memory. Mental Status-Normal.  Neuropsychiatric The patient's mood and affect are described as -normal. Judgment and Insight-insight is appropriate concerning matters relevant to self.  Musculoskeletal Normal Exam - Left-Upper Extremity Strength Normal and Lower Extremity Strength Normal. Normal Exam - Right-Upper Extremity Strength Normal and Lower Extremity Strength Normal.  Lymphatic Head & Neck  General Head & Neck Lymphatics: Bilateral - Description - Normal. Axillary - Did not examine. Femoral & Inguinal - Did not examine.    Assessment & Plan Susan Roth M. Castella Lerner MD; 01/24/2018 9:58 AM) SYMPTOMATIC CHOLELITHIASIS (K80.20) Impression: I believe the patient's symptoms are consistent with gallbladder disease.  We discussed gallbladder disease. The patient was given Neurosurgeon. We discussed non-operative and operative management. We discussed the signs & symptoms of acute cholecystitis  I discussed laparoscopic cholecystectomy with IOC in detail. The patient was given educational material as well as diagrams detailing the procedure. We discussed the risks and benefits of a laparoscopic cholecystectomy including, but not limited to bleeding, infection, injury to surrounding structures such as the intestine or liver, bile leak, retained gallstones, need to convert to an open procedure, prolonged diarrhea, blood clots such as DVT,  common bile duct injury, anesthesia risks, and possible need for additional procedures. We discussed the typical post-operative recovery course. I explained that the likelihood of improvement of their symptoms is good.  The patient has elected to proceed  with surgery.  We did discuss that her heartburn may be unrelated to gallbladder & may not be ameliorated with gallbladder surgery Current Plans Pt Education - Pamphlet Given - Laparoscopic Gallbladder Surgery: discussed with patient and provided information.  Leighton Ruff. Susan Pulling, MD, FACS General, Bariatric, & Minimally Invasive Surgery [facility], [place]

## 2018-01-28 NOTE — Patient Instructions (Addendum)
Susan Roth  01/28/2018   Your procedure is scheduled on: 01-30-18  Report to [facility] Main  Entrance   Report to admitting at       0530 AM    Call this number if you have problems the morning of surgery [phone]   Remember: Do not eat food  :After Midnight.  NO SOLID FOOD AFTER MIDNIGHT THE NIGHT PRIOR TO SURGERY. NOTHING BY MOUTH EXCEPT CLEAR LIQUIDS UNTIL 3 HOURS PRIOR TO [place] SURGERY. PLEASE FINISH ENSURE DRINK PER SURGEON ORDER 3 HOURS PRIOR TO SCHEDULED SURGERY TIME WHICH NEEDS TO BE COMPLETED AT _____0430am _______.   Take these medicines the morning of surgery with A SIP OF WATER: none                               You may not have any metal on your body including hair pins and              piercings  Do not wear jewelry, make-up, lotions, powders or perfumes, deodorant             Do not wear nail polish.  Do not shave  48 hours prior to surgery.     Do not bring valuables to the hospital. [name].  Contacts, dentures or bridgework may not be worn into surgery.      Patients discharged the day of surgery will not be allowed to drive home.  Name and phone number of your driver:  Special Instructions: N/A              Please read over the following fact sheets you were given: _____________________________________________________________________          [facility] - Preparing for Surgery Before surgery, you can play an important role.  Because skin is not sterile, your skin needs to be as free of germs as possible.  You can reduce the number of germs on your skin by washing with CHG (chlorahexidine gluconate) soap before surgery.  CHG is an antiseptic cleaner which kills germs and bonds with the skin to continue killing germs even after washing. Please DO NOT use if you have an allergy to CHG or antibacterial soaps.  If your skin becomes reddened/irritated stop using the CHG and inform  your nurse when you arrive at Short Stay. Do not shave (including legs and underarms) for at least 48 hours prior to the first CHG shower.  You may shave your face/neck. Please follow these instructions carefully:  1.  Shower with CHG Soap the night before surgery and the  morning of Surgery.  2.  If you choose to wash your hair, wash your hair first as usual with your  normal  shampoo.  3.  After you shampoo, rinse your hair and body thoroughly to remove the  shampoo.                           4.  Use CHG as you would any other liquid soap.  You can apply chg directly  to the skin and wash  Gently with a scrungie or clean washcloth.  5.  Apply the CHG Soap to your body ONLY FROM THE NECK DOWN.   Do not use on face/ open                           Wound or open sores. Avoid contact with eyes, ears mouth and genitals (private parts).                       Wash face,  Genitals (private parts) with your normal soap.             6.  Wash thoroughly, paying special attention to the area where your surgery  will be performed.  7.  Thoroughly rinse your body with warm water from the neck down.  8.  DO NOT shower/wash with your normal soap after using and rinsing off  the CHG Soap.                9.  Pat yourself dry with a clean towel.            10.  Wear clean pajamas.            11.  Place clean sheets on your bed the night of your first shower and do not  sleep with pets. Day of Surgery : Do not apply any lotions/deodorants the morning of surgery.  Please wear clean clothes to the hospital/surgery center.  FAILURE TO FOLLOW THESE INSTRUCTIONS MAY RESULT IN THE CANCELLATION OF YOUR SURGERY PATIENT SIGNATURE_________________________________  NURSE SIGNATURE__________________________________  ________________________________________________________________________

## 2018-01-28 NOTE — H&P (Signed)
Susan Roth Documented: 01/24/2018 9:22 AM Location: [facility] Patient #: [id number] DOB: 04-25-1976 Separated / Language: [name] / Race: Black or African American Female   History of Present Illness Randall Hiss M. Jene Huq MD; 01/24/2018 9:57 AM) The patient is a 42 year old female who presents for evaluation of gall stones. She is referred by Mackie Pai PA for evaluation of gallstones. She states for about a year she has had intermittent episodes of right upper quadrant pain. It will sometimes radiate to between her shoulder blades. She may be having some more frequent episodes of the past month. None of the episodes are severe pain. She describes it as a discomfort that she can tolerate. Episodes generally last about 2-3 hours. She will have some nausea occasionally with that as well as bloating. She has noticed that when she has her cycle she'll have an increased number of episodes. She does endorse eating a fair amount of fast food. She tends to notice episodes more in the evening. She also complains of some heartburn. She ended up going to urgent care in mid August and subsequently sent to the ER at [facility] She had an ultrasound which showed debris in the gallbladder neck. She had renal stones CT scan which showed nonobstructing right kidney stone as well as stones and sludge in the gallbladder. Her labs were unremarkable. She has been intermittently on phentermine in attempts to lose weight. She states that when she does lose weight rapidly she will also noticed increased number of episodes. He denies any acholic stools. She has had some laparoscopic surgery for endometriosis as well as tube removal. She denies any fevers or chills.   Problem List/Past Medical Randall Hiss M. Redmond Pulling, MD; 01/24/2018 9:58 AM) SYMPTOMATIC CHOLELITHIASIS (K80.20)   Past Surgical History (Tanisha A. Owens Shark, [place]; 01/24/2018 9:23 AM) Carotid Artery Surgery  Right. Oral Surgery   Tonsillectomy   Diagnostic Studies History (Tanisha A. Owens Shark, [place]; 01/24/2018 9:23 AM) Colonoscopy  5-10 years ago Mammogram  within last year Pap Smear  1-5 years ago  Allergies (Tanisha A. Owens Shark, [place]; 01/24/2018 9:23 AM) No Known Drug Allergies [01/24/2018]:  Medication History (Tanisha A. Owens Shark, [place]; 01/24/2018 9:23 AM) Dicyclomine HCl (20MG  Tablet, Oral) Active. Phentermine HCl (37.5MG  Tablet, Oral) Active. Medications Reconciled  Social History (Tanisha A. Owens Shark, [place]; 01/24/2018 9:23 AM) Alcohol use  Moderate alcohol use. Caffeine use  Carbonated beverages, Coffee.  Family History (Tanisha A. Owens Shark, RMA; 01/24/2018 9:23 AM) Cancer  Father. Colon Cancer  Father. Colon Polyps  Father, Mother. Ischemic Bowel Disease  Mother. Respiratory Condition  Father.  Pregnancy / Birth History (Tanisha A. Owens Shark, [place]; 01/24/2018 9:23 AM) Age at menarche  55 years. Age of menopause  <45 Gravida  1 Maternal age  60-25 Para  1 Regular periods   Other Problems Randall Hiss M. Redmond Pulling, MD; 01/24/2018 9:58 AM) Back Pain  Cholelithiasis  Gastroesophageal Reflux Disease  Kidney Stone     Review of Systems (Tanisha A. Brown RMA; 01/24/2018 9:23 AM) Skin Not Present- Change in Wart/Mole, Dryness, Hives, Jaundice, New Lesions, Non-Healing Wounds, Rash and Ulcer. HEENT Present- Seasonal Allergies and Sinus Pain. Not Present- Earache, Hearing Loss, Hoarseness, Nose Bleed, Oral Ulcers, Ringing in the Ears, Sore Throat, Visual Disturbances, Wears glasses/contact lenses and Yellow Eyes. Respiratory Not Present- Bloody sputum, Chronic Cough, Difficulty Breathing, Snoring and Wheezing. Breast Not Present- Breast Mass, Breast Pain, Nipple Discharge and Skin Changes. Cardiovascular Present- Shortness of Breath. Not Present- Chest Pain, Difficulty Breathing Lying Down, Leg Cramps, Palpitations, Rapid  Heart Rate and Swelling of Extremities. Gastrointestinal Present- Abdominal Pain, Bloating,  Constipation, Indigestion and Nausea. Not Present- Bloody Stool, Change in Bowel Habits, Chronic diarrhea, Difficulty Swallowing, Excessive gas, Gets full quickly at meals, Hemorrhoids, Rectal Pain and Vomiting. Musculoskeletal Present- Back Pain and Joint Stiffness. Not Present- Joint Pain, Muscle Pain, Muscle Weakness and Swelling of Extremities. Psychiatric Not Present- Anxiety, Bipolar, Change in Sleep Pattern, Depression, Fearful and Frequent crying. Endocrine Not Present- Cold Intolerance, Excessive Hunger, Hair Changes, Heat Intolerance, Hot flashes and New Diabetes. Hematology Not Present- Blood Thinners, Easy Bruising, Excessive bleeding, Gland problems, HIV and Persistent Infections.  Vitals (Tanisha A. Brown RMA; 01/24/2018 9:23 AM) 01/24/2018 9:23 AM Weight: 206.8 lb Height: 64in Body Surface Area: 1.98 m Body Mass Index: 35.5 kg/m  Temp.: 97.70F  BP: 128/82 (Sitting, Left Arm, Standard)       Physical Exam Randall Hiss M. Bayler Nehring MD; 01/24/2018 9:57 AM) General Mental Status-Alert. General Appearance-Consistent with stated age. Hydration-Well hydrated. Voice-Normal.  Head and Neck Head-normocephalic, atraumatic with no lesions or palpable masses. Trachea-midline. Thyroid Gland Characteristics - normal size and consistency.  Eye Eyeball - Bilateral-Extraocular movements intact. Sclera/Conjunctiva - Bilateral-No scleral icterus.  ENMT Ears -Note: normal external ears.  Mouth and Throat -Note: lips intact.   Chest and Lung Exam Chest and lung exam reveals -quiet, even and easy respiratory effort with no use of accessory muscles and on auscultation, normal breath sounds, no adventitious sounds and normal vocal resonance. Inspection Chest Wall - Normal. Back - normal.  Breast - Did not examine.  Cardiovascular Cardiovascular examination reveals -normal heart sounds, regular rate and rhythm with no murmurs and normal pedal pulses  bilaterally.  Abdomen Inspection Inspection of the abdomen reveals - No Hernias. Skin - Scar - Note: well healed trocar scars. Palpation/Percussion Palpation and Percussion of the abdomen reveal - Soft, Non Tender, No Rebound tenderness, No Rigidity (guarding) and No hepatosplenomegaly. Auscultation Auscultation of the abdomen reveals - Bowel sounds normal.  Peripheral Vascular Upper Extremity Palpation - Pulses bilaterally normal.  Neurologic Neurologic evaluation reveals -alert and oriented x 3 with no impairment of recent or remote memory. Mental Status-Normal.  Neuropsychiatric The patient's mood and affect are described as -normal. Judgment and Insight-insight is appropriate concerning matters relevant to self.  Musculoskeletal Normal Exam - Left-Upper Extremity Strength Normal and Lower Extremity Strength Normal. Normal Exam - Right-Upper Extremity Strength Normal and Lower Extremity Strength Normal.  Lymphatic Head & Neck  General Head & Neck Lymphatics: Bilateral - Description - Normal. Axillary - Did not examine. Femoral & Inguinal - Did not examine.    Assessment & Plan Randall Hiss M. Daniah Zaldivar MD; 01/24/2018 9:58 AM) SYMPTOMATIC CHOLELITHIASIS (K80.20) Impression: I believe the patient's symptoms are consistent with gallbladder disease.  We discussed gallbladder disease. The patient was given Neurosurgeon. We discussed non-operative and operative management. We discussed the signs & symptoms of acute cholecystitis  I discussed laparoscopic cholecystectomy with IOC in detail. The patient was given educational material as well as diagrams detailing the procedure. We discussed the risks and benefits of a laparoscopic cholecystectomy including, but not limited to bleeding, infection, injury to surrounding structures such as the intestine or liver, bile leak, retained gallstones, need to convert to an open procedure, prolonged diarrhea, blood clots such as DVT,  common bile duct injury, anesthesia risks, and possible need for additional procedures. We discussed the typical post-operative recovery course. I explained that the likelihood of improvement of their symptoms is good.  The patient has elected to proceed  with surgery.  We did discuss that her heartburn may be unrelated to gallbladder & may not be ameliorated with gallbladder surgery Current Plans Pt Education - Pamphlet Given - Laparoscopic Gallbladder Surgery: discussed with patient and provided information.  Leighton Ruff. Redmond Pulling, MD, FACS General, Bariatric, & Minimally Invasive Surgery [facility], [place]

## 2018-01-29 ENCOUNTER — Encounter (HOSPITAL_COMMUNITY): Payer: Self-pay

## 2018-01-29 ENCOUNTER — Other Ambulatory Visit: Payer: Self-pay

## 2018-01-29 ENCOUNTER — Encounter (HOSPITAL_COMMUNITY)
Admission: RE | Admit: 2018-01-29 | Discharge: 2018-01-29 | Disposition: A | Discharge status: Home / Self Care | Payer: 59 | Source: Ambulatory Visit | Attending: General Surgery | Admitting: General Surgery

## 2018-01-29 DIAGNOSIS — Z79899 Other long term (current) drug therapy: Secondary | ICD-10-CM | POA: Diagnosis not present

## 2018-01-29 DIAGNOSIS — K811 Chronic cholecystitis: Secondary | ICD-10-CM | POA: Diagnosis not present

## 2018-01-29 DIAGNOSIS — K8020 Calculus of gallbladder without cholecystitis without obstruction: Secondary | ICD-10-CM | POA: Diagnosis present

## 2018-01-29 HISTORY — DX: Gastro-esophageal reflux disease without esophagitis: K21.9

## 2018-01-29 HISTORY — DX: Personal history of urinary calculi: Z87.442

## 2018-01-29 LAB — CBC
HCT: 41.3 % (ref 36.0–46.0)
Hemoglobin: 14.5 g/dL (ref 12.0–15.0)
MCH: 31.7 pg (ref 26.0–34.0)
MCHC: 35.1 g/dL (ref 30.0–36.0)
MCV: 90.4 fL (ref 78.0–100.0)
PLATELETS: UNDETERMINED 10*3/uL (ref 150–400)
RBC: 4.57 MIL/uL (ref 3.87–5.11)
RDW: 12.3 % (ref 11.5–15.5)
WBC: 3.7 10*3/uL — ABNORMAL LOW (ref 4.0–10.5)

## 2018-01-30 ENCOUNTER — Ambulatory Visit (HOSPITAL_COMMUNITY)
Admission: RE | Admit: 2018-01-30 | Discharge: 2018-01-30 | Disposition: A | Discharge status: Home / Self Care | Payer: 59 | Source: Ambulatory Visit | Attending: General Surgery | Admitting: General Surgery

## 2018-01-30 ENCOUNTER — Encounter (HOSPITAL_COMMUNITY)
Admission: RE | Disposition: A | Discharge status: Home / Self Care | Payer: Self-pay | Source: Ambulatory Visit | Attending: General Surgery

## 2018-01-30 ENCOUNTER — Ambulatory Visit (HOSPITAL_COMMUNITY): Payer: 59 | Admitting: Anesthesiology

## 2018-01-30 ENCOUNTER — Encounter (HOSPITAL_COMMUNITY): Payer: Self-pay | Admitting: Emergency Medicine

## 2018-01-30 DIAGNOSIS — Z79899 Other long term (current) drug therapy: Secondary | ICD-10-CM | POA: Insufficient documentation

## 2018-01-30 DIAGNOSIS — K811 Chronic cholecystitis: Secondary | ICD-10-CM | POA: Insufficient documentation

## 2018-01-30 HISTORY — PX: CHOLECYSTECTOMY: SHX55

## 2018-01-30 LAB — PREGNANCY, URINE: Preg Test, Ur: NEGATIVE

## 2018-01-30 SURGERY — LAPAROSCOPIC CHOLECYSTECTOMY
Anesthesia: General

## 2018-01-30 MED ORDER — FENTANYL CITRATE (PF) 100 MCG/2ML IJ SOLN
INTRAMUSCULAR | Status: AC
  Filled 2018-01-30: qty 2

## 2018-01-30 MED ORDER — KETAMINE HCL 10 MG/ML IJ SOLN
INTRAMUSCULAR | Status: DC | PRN
  Administered 2018-01-30: 30 mg via INTRAVENOUS

## 2018-01-30 MED ORDER — CIPROFLOXACIN IN D5W 400 MG/200ML IV SOLN
400.0000 mg | INTRAVENOUS | Status: AC
  Administered 2018-01-30: 400 mg via INTRAVENOUS
  Filled 2018-01-30: qty 200

## 2018-01-30 MED ORDER — MEPERIDINE HCL 50 MG/ML IJ SOLN: 6.2500 mg | INTRAMUSCULAR | Status: DC | PRN

## 2018-01-30 MED ORDER — MIDAZOLAM HCL 2 MG/2ML IJ SOLN
INTRAMUSCULAR | Status: AC
  Filled 2018-01-30: qty 2

## 2018-01-30 MED ORDER — ACETAMINOPHEN 500 MG PO TABS
ORAL_TABLET | ORAL | Status: AC
  Administered 2018-01-30: 1000 mg via ORAL
  Filled 2018-01-30: qty 2

## 2018-01-30 MED ORDER — CHLORHEXIDINE GLUCONATE 4 % EX LIQD: 60.0000 mL | Freq: Once | CUTANEOUS | Status: DC

## 2018-01-30 MED ORDER — SUGAMMADEX SODIUM 200 MG/2ML IV SOLN
INTRAVENOUS | Status: DC | PRN
  Administered 2018-01-30: 200 mg via INTRAVENOUS

## 2018-01-30 MED ORDER — BUPIVACAINE-EPINEPHRINE (PF) 0.25% -1:200000 IJ SOLN
INTRAMUSCULAR | Status: AC
  Filled 2018-01-30: qty 30

## 2018-01-30 MED ORDER — OXYCODONE HCL 5 MG PO TABS: 5.0000 mg | ORAL_TABLET | Freq: Four times a day (QID) | ORAL | 0 refills | Status: DC | PRN

## 2018-01-30 MED ORDER — DEXAMETHASONE SODIUM PHOSPHATE 10 MG/ML IJ SOLN
INTRAMUSCULAR | Status: AC
  Filled 2018-01-30: qty 1

## 2018-01-30 MED ORDER — PROPOFOL 10 MG/ML IV BOLUS
INTRAVENOUS | Status: AC
  Filled 2018-01-30: qty 40

## 2018-01-30 MED ORDER — PROPOFOL 10 MG/ML IV BOLUS
INTRAVENOUS | Status: DC | PRN
  Administered 2018-01-30: 50 mg via INTRAVENOUS
  Administered 2018-01-30: 200 mg via INTRAVENOUS

## 2018-01-30 MED ORDER — LACTATED RINGERS IV SOLN
INTRAVENOUS | Status: DC | PRN
  Administered 2018-01-30 (×2): via INTRAVENOUS

## 2018-01-30 MED ORDER — FENTANYL CITRATE (PF) 250 MCG/5ML IJ SOLN
INTRAMUSCULAR | Status: AC
  Filled 2018-01-30: qty 5

## 2018-01-30 MED ORDER — LIDOCAINE 2% (20 MG/ML) 5 ML SYRINGE
INTRAMUSCULAR | Status: DC | PRN
  Administered 2018-01-30: 1.5 mg/kg/h via INTRAVENOUS

## 2018-01-30 MED ORDER — SCOPOLAMINE 1 MG/3DAYS TD PT72
MEDICATED_PATCH | TRANSDERMAL | Status: AC
  Filled 2018-01-30: qty 1

## 2018-01-30 MED ORDER — GABAPENTIN 300 MG PO CAPS
300.0000 mg | ORAL_CAPSULE | ORAL | Status: AC
  Administered 2018-01-30: 300 mg via ORAL

## 2018-01-30 MED ORDER — IOPAMIDOL (ISOVUE-300) INJECTION 61%
INTRAVENOUS | Status: AC
  Filled 2018-01-30: qty 50

## 2018-01-30 MED ORDER — DEXAMETHASONE SODIUM PHOSPHATE 10 MG/ML IJ SOLN
INTRAMUSCULAR | Status: DC | PRN
  Administered 2018-01-30: 8 mg via INTRAVENOUS

## 2018-01-30 MED ORDER — LIDOCAINE HCL (CARDIAC) PF 100 MG/5ML IV SOSY
PREFILLED_SYRINGE | INTRAVENOUS | Status: DC | PRN
  Administered 2018-01-30: 20 mg via INTRATRACHEAL

## 2018-01-30 MED ORDER — LACTATED RINGERS IV SOLN: INTRAVENOUS | Status: DC

## 2018-01-30 MED ORDER — METOCLOPRAMIDE HCL 5 MG/ML IJ SOLN
INTRAMUSCULAR | Status: AC
  Filled 2018-01-30: qty 2

## 2018-01-30 MED ORDER — KETAMINE HCL 10 MG/ML IJ SOLN
INTRAMUSCULAR | Status: AC
  Filled 2018-01-30: qty 1

## 2018-01-30 MED ORDER — OXYCODONE HCL 5 MG PO TABS
ORAL_TABLET | ORAL | Status: AC
  Filled 2018-01-30: qty 1

## 2018-01-30 MED ORDER — MIDAZOLAM HCL 5 MG/5ML IJ SOLN
INTRAMUSCULAR | Status: DC | PRN
  Administered 2018-01-30: 2 mg via INTRAVENOUS

## 2018-01-30 MED ORDER — FENTANYL CITRATE (PF) 100 MCG/2ML IJ SOLN
25.0000 ug | INTRAMUSCULAR | Status: DC | PRN
  Administered 2018-01-30 (×2): 50 ug via INTRAVENOUS

## 2018-01-30 MED ORDER — GABAPENTIN 300 MG PO CAPS
ORAL_CAPSULE | ORAL | Status: AC
  Administered 2018-01-30: 300 mg via ORAL
  Filled 2018-01-30: qty 1

## 2018-01-30 MED ORDER — OXYCODONE HCL 5 MG PO TABS
5.0000 mg | ORAL_TABLET | ORAL | Status: AC | PRN
  Administered 2018-01-30: 5 mg via ORAL

## 2018-01-30 MED ORDER — BUPIVACAINE-EPINEPHRINE 0.25% -1:200000 IJ SOLN
INTRAMUSCULAR | Status: DC | PRN
  Administered 2018-01-30: 30 mL

## 2018-01-30 MED ORDER — METOCLOPRAMIDE HCL 5 MG/ML IJ SOLN
10.0000 mg | Freq: Once | INTRAMUSCULAR | Status: AC | PRN
  Administered 2018-01-30: 10 mg via INTRAVENOUS

## 2018-01-30 MED ORDER — FENTANYL CITRATE (PF) 100 MCG/2ML IJ SOLN
INTRAMUSCULAR | Status: DC | PRN
  Administered 2018-01-30: 100 ug via INTRAVENOUS
  Administered 2018-01-30: 50 ug via INTRAVENOUS
  Administered 2018-01-30: 100 ug via INTRAVENOUS

## 2018-01-30 MED ORDER — SUCCINYLCHOLINE CHLORIDE 200 MG/10ML IV SOSY
PREFILLED_SYRINGE | INTRAVENOUS | Status: DC | PRN
  Administered 2018-01-30: 120 mg via INTRAVENOUS

## 2018-01-30 MED ORDER — LIDOCAINE HCL 2 % IJ SOLN
INTRAMUSCULAR | Status: AC
  Filled 2018-01-30: qty 20

## 2018-01-30 MED ORDER — ROCURONIUM BROMIDE 100 MG/10ML IV SOLN
INTRAVENOUS | Status: DC | PRN
  Administered 2018-01-30: 45 mg via INTRAVENOUS
  Administered 2018-01-30: 5 mg via INTRAVENOUS

## 2018-01-30 MED ORDER — ONDANSETRON HCL 4 MG/2ML IJ SOLN
INTRAMUSCULAR | Status: AC
  Filled 2018-01-30: qty 2

## 2018-01-30 MED ORDER — ACETAMINOPHEN 500 MG PO TABS
1000.0000 mg | ORAL_TABLET | ORAL | Status: AC
  Administered 2018-01-30: 1000 mg via ORAL

## 2018-01-30 MED ORDER — LACTATED RINGERS IR SOLN
Status: DC | PRN
  Administered 2018-01-30: 1000 mL

## 2018-01-30 SURGICAL SUPPLY — 47 items
APPLICATOR ARISTA FLEXITIP XL (MISCELLANEOUS) IMPLANT
APPLIER CLIP 5 13 M/L LIGAMAX5 (MISCELLANEOUS) ×2
APPLIER CLIP ROT 10 11.4 M/L (STAPLE)
BANDAGE ADH SHEER 1  50/CT (GAUZE/BANDAGES/DRESSINGS) IMPLANT
BENZOIN TINCTURE PRP APPL 2/3 (GAUZE/BANDAGES/DRESSINGS) IMPLANT
CABLE HIGH FREQUENCY MONO STRZ (ELECTRODE) ×2 IMPLANT
CHLORAPREP W/TINT 26ML (MISCELLANEOUS) ×2 IMPLANT
CLIP APPLIE 5 13 M/L LIGAMAX5 (MISCELLANEOUS) ×1 IMPLANT
CLIP APPLIE ROT 10 11.4 M/L (STAPLE) IMPLANT
CLIP VESOLOCK MED LG 6/CT (CLIP) IMPLANT
COVER MAYO STAND STRL (DRAPES) IMPLANT
COVER SURGICAL LIGHT HANDLE (MISCELLANEOUS) ×2 IMPLANT
DECANTER SPIKE VIAL GLASS SM (MISCELLANEOUS) ×2 IMPLANT
DERMABOND ADVANCED (GAUZE/BANDAGES/DRESSINGS) ×1
DERMABOND ADVANCED .7 DNX12 (GAUZE/BANDAGES/DRESSINGS) ×1 IMPLANT
DRAPE C-ARM 42X120 X-RAY (DRAPES) IMPLANT
DRSG TEGADERM 2-3/8X2-3/4 SM (GAUZE/BANDAGES/DRESSINGS) IMPLANT
ELECT PENCIL ROCKER SW 15FT (MISCELLANEOUS) IMPLANT
ELECT REM PT RETURN 15FT ADLT (MISCELLANEOUS) ×2 IMPLANT
GAUZE SPONGE 2X2 8PLY STRL LF (GAUZE/BANDAGES/DRESSINGS) IMPLANT
GLOVE BIO SURGEON STRL SZ7.5 (GLOVE) ×2 IMPLANT
GLOVE INDICATOR 8.0 STRL GRN (GLOVE) ×2 IMPLANT
GOWN STRL REUS W/TWL XL LVL3 (GOWN DISPOSABLE) ×6 IMPLANT
GRASPER SUT TROCAR 14GX15 (MISCELLANEOUS) ×2 IMPLANT
HEMOSTAT ARISTA ABSORB 3G PWDR (MISCELLANEOUS) IMPLANT
HEMOSTAT SNOW SURGICEL 2X4 (HEMOSTASIS) IMPLANT
KIT BASIN OR (CUSTOM PROCEDURE TRAY) ×2 IMPLANT
L-HOOK LAP DISP 36CM (ELECTROSURGICAL)
LHOOK LAP DISP 36CM (ELECTROSURGICAL) IMPLANT
POUCH RETRIEVAL ECOSAC 10 (ENDOMECHANICALS) ×1 IMPLANT
POUCH RETRIEVAL ECOSAC 10MM (ENDOMECHANICALS) ×1
SCISSORS LAP 5X35 DISP (ENDOMECHANICALS) ×2 IMPLANT
SET CHOLANGIOGRAPH MIX (MISCELLANEOUS) IMPLANT
SET IRRIG TUBING LAPAROSCOPIC (IRRIGATION / IRRIGATOR) ×2 IMPLANT
SLEEVE XCEL OPT CAN 5 100 (ENDOMECHANICALS) ×4 IMPLANT
SPONGE GAUZE 2X2 STER 10/PKG (GAUZE/BANDAGES/DRESSINGS)
STRIP CLOSURE SKIN 1/2X4 (GAUZE/BANDAGES/DRESSINGS) IMPLANT
SUT MNCRL AB 4-0 PS2 18 (SUTURE) ×2 IMPLANT
SUT VICRYL 0 TIES 12 18 (SUTURE) ×2 IMPLANT
SUT VICRYL 0 UR6 27IN ABS (SUTURE) IMPLANT
TOWEL OR 17X26 10 PK STRL BLUE (TOWEL DISPOSABLE) ×2 IMPLANT
TOWEL OR NON WOVEN STRL DISP B (DISPOSABLE) ×2 IMPLANT
TRAY LAPAROSCOPIC (CUSTOM PROCEDURE TRAY) ×2 IMPLANT
TROCAR BLADELESS OPT 5 100 (ENDOMECHANICALS) ×2 IMPLANT
TROCAR XCEL BLUNT TIP 100MML (ENDOMECHANICALS) ×2 IMPLANT
TROCAR XCEL NON-BLD 11X100MML (ENDOMECHANICALS) IMPLANT
TUBING INSUF HEATED (TUBING) ×2 IMPLANT

## 2018-01-30 NOTE — Anesthesia Procedure Notes (Addendum)
Procedure Name: Intubation Date/Time: 01/30/2018 7:32 AM Performed by: [name], MD Pre-anesthesia Checklist: Patient identified, Emergency Drugs available, Suction available, Patient being monitored and Timeout performed Patient Re-evaluated:Patient Re-evaluated prior to induction Oxygen Delivery Method: Circle system utilized Preoxygenation: Pre-oxygenation with 100% oxygen Induction Type: IV induction Ventilation: Mask ventilation without difficulty Laryngoscope Size: Mac and 4 Grade View: Grade I Tube type: Oral (breath sounds checked by Dr. Marcell Barlow) Tube size: 7.0 mm Number of attempts: 1 Airway Equipment and Method: Patient positioned with wedge pillow and Stylet Placement Confirmation: ETT inserted through vocal cords under direct vision,  breath sounds checked- equal and bilateral and positive ETCO2 Secured at: 22 cm Tube secured with: Tape Dental Injury: Teeth and Oropharynx as per pre-operative assessment  Comments: Inserted by Viann Fish, SRNA

## 2018-01-30 NOTE — Discharge Instructions (Addendum)
[facility] °LAPAROSCOPIC SURGERY: POST OP INSTRUCTIONS °Always review your discharge instruction sheet given to you by the facility where your surgery was performed. °IF YOU HAVE DISABILITY OR FAMILY LEAVE FORMS, YOU MUST BRING THEM TO THE OFFICE FOR PROCESSING.   °DO NOT GIVE THEM TO YOUR DOCTOR. ° °PAIN CONTROL ° °1. First take acetaminophen (Tylenol) AND/or ibuprofen (Advil) to control your pain after surgery.  Follow directions on package.  Taking acetaminophen (Tylenol) and/or ibuprofen (Advil) regularly after surgery will help to control your pain and lower the amount of prescription pain medication you may need.  You should not take more than 4,000 mg (4 grams) of acetaminophen (Tylenol) in 24 hours.  You should not take ibuprofen (Advil), aleve, motrin, naprosyn or other NSAIDS if you have a history of stomach ulcers or chronic kidney disease.  °2. A prescription for pain medication may be given to you upon discharge.  Take your pain medication as prescribed, if you still have uncontrolled pain after taking acetaminophen (Tylenol) or ibuprofen (Advil). °3. Use ice packs to help control pain. °4. If you need a refill on your pain medication, please contact your pharmacy.  They will contact our office to request authorization. Prescriptions will not be filled after 5pm or on week-ends. ° °HOME MEDICATIONS °5. Take your usually prescribed medications unless otherwise directed. ° °DIET °6. You should follow a light diet the first few days after arrival home.  Be sure to include lots of fluids daily. Avoid fatty, fried foods.  ° °CONSTIPATION °7. It is common to experience some constipation after surgery and if you are taking pain medication.  Increasing fluid intake and taking a stool softener (such as Colace) will usually help or prevent this problem from occurring.  A mild laxative (Milk of Magnesia or Miralax) should be taken according to package instructions if there are no bowel  movements after 48 hours. ° °WOUND/INCISION CARE °8. Most patients will experience some swelling and bruising in the area of the incisions.  Ice packs will help.  Swelling and bruising can take several days to resolve.  °9. Unless discharge instructions indicate otherwise, follow guidelines below  °a. STERI-STRIPS - you may remove your outer bandages 48 hours after surgery, and you may shower at that time.  You have steri-strips (small skin tapes) in place directly over the incision.  These strips should be left on the skin for 7-10 days.   °b. DERMABOND/SKIN GLUE - you may shower in 24 hours.  The glue will flake off over the next 2-3 weeks. °10. Any sutures or staples will be removed at the office during your follow-up visit. ° °ACTIVITIES °11. You may resume regular (light) daily activities beginning the next day--such as daily self-care, walking, climbing stairs--gradually increasing activities as tolerated.  You may have sexual intercourse when it is comfortable.  Refrain from any heavy lifting or straining until approved by your doctor. °a. You may drive when you are no longer taking prescription pain medication, you can comfortably wear a seatbelt, and you can safely maneuver your car and apply brakes. ° °FOLLOW-UP °12. You should see your doctor in the office for a follow-up appointment approximately 2-3 weeks after your surgery.  You should have been given your post-op/follow-up appointment when your surgery was scheduled.  If you did not receive a post-op/follow-up appointment, make sure that you call for this appointment within a day or two after you arrive home to insure a convenient appointment time. ° °OTHER   INSTRUCTIONS °13.  ° °WHEN TO CALL YOUR DOCTOR: °1. Fever over 101.0 °2. Inability to urinate °3. Continued bleeding from incision. °4. Increased pain, redness, or drainage from the incision. °5. Increasing abdominal pain ° °The clinic staff is available to answer your questions during regular  business hours.  Please don’t hesitate to call and ask to speak to one of the nurses for clinical concerns.  If you have a medical emergency, go to the nearest emergency room or call 911.  A surgeon from [facility] is always on call at the hospital. °[address], [address], [place], [place]  [address] ? [address] [address], [place], [place]   [address] °[phone] ? [phone] ? FAX [phone] °Web site: [email or web address] ° °

## 2018-01-30 NOTE — Transfer of Care (Signed)
Immediate Anesthesia Transfer of Care Note  Patient: Susan Roth  Procedure(s) Performed: LAPAROSCOPIC CHOLECYSTECTOMY (N/A )  Patient Location: PACU  Anesthesia Type:General  Level of Consciousness: awake, alert , oriented and patient cooperative  Airway & Oxygen Therapy: Patient Spontanous Breathing and Patient connected to face mask oxygen  Post-op Assessment: Report given to RN, Post -op Vital signs reviewed and stable and Patient moving all extremities X 4  Post vital signs: stable  Last Vitals:  Vitals Value Taken Time  BP 108/84 01/30/2018  8:46 AM  Temp 36.5 C 01/30/2018  8:46 AM  Pulse 68 01/30/2018  8:51 AM  Resp 11 01/30/2018  8:51 AM  SpO2 100 % 01/30/2018  8:51 AM  Vitals shown include unvalidated device data.  Last Pain:  Vitals:   01/30/18 0634  TempSrc: Oral  PainSc:       Patients Stated Pain Goal: 4 (16/94/50 3888)  Complications: No apparent anesthesia complications

## 2018-01-30 NOTE — Anesthesia Postprocedure Evaluation (Signed)
Anesthesia Post Note  Patient: Susan Roth  Procedure(s) Performed: LAPAROSCOPIC CHOLECYSTECTOMY (N/A )     Patient location during evaluation: PACU Anesthesia Type: General Level of consciousness: awake and alert Pain management: pain level controlled Vital Signs Assessment: post-procedure vital signs reviewed and stable Respiratory status: spontaneous breathing, nonlabored ventilation, respiratory function stable and patient connected to nasal cannula oxygen Cardiovascular status: blood pressure returned to baseline and stable Postop Assessment: no apparent nausea or vomiting Anesthetic complications: no    Last Vitals:  Vitals:   01/30/18 1000 01/30/18 1025  BP: 112/74 115/70  Pulse: 64 62  Resp: 14 14  Temp:    SpO2: 97% 97%    Last Pain:  Vitals:   01/30/18 1025  TempSrc:   PainSc: 2                  [name]

## 2018-01-30 NOTE — Op Note (Signed)
Susan Roth [id number] 08/06/1975 01/30/2018  Laparoscopic Cholecystectomy  Procedure Note  Indications: This patient presents with symptomatic gallbladder disease and will undergo laparoscopic cholecystectomy.  Pre-operative Diagnosis: symptomatic cholelithiasis  Post-operative Diagnosis: chronic calculous cholecystitis  Surgeon: Greer Pickerel MD FACS  Assistants: none  Anesthesia: General endotracheal anesthesia  Procedure Details  The patient was seen again in the Holding Room. The risks, benefits, complications, treatment options, and expected outcomes were discussed with the patient. The possibilities of reaction to medication, pulmonary aspiration, perforation of viscus, bleeding, recurrent infection, finding a normal gallbladder, the need for additional procedures, failure to diagnose a condition, the possible need to convert to an open procedure, and creating a complication requiring transfusion or operation were discussed with the patient. The likelihood of improving the patient's symptoms with return to their baseline status is good.  The patient and/or family concurred with the proposed plan, giving informed consent. The site of surgery properly noted. The patient was taken to Operating Room, identified as Susan Roth and the procedure verified as Laparoscopic Cholecystectomy. A Time Out was held and the above information confirmed. Antibiotic prophylaxis was administered.   Prior to the induction of general anesthesia, antibiotic prophylaxis was administered. General endotracheal anesthesia was then administered and tolerated well. After the induction, the abdomen was prepped with Chloraprep and draped in the sterile fashion. The patient was positioned in the supine position.  Local anesthetic agent was injected into the skin near the umbilicus and an incision made. We dissected down to the abdominal fascia with blunt dissection.  The fascia was incised vertically and we  entered the peritoneal cavity bluntly.  A pursestring suture of 0-Vicryl was placed around the fascial opening.  The Hasson cannula was inserted and secured with the stay suture.  Pneumoperitoneum was then created with CO2 and tolerated well without any adverse changes in the patient's vital signs. An 5-mm port was placed in the subxiphoid position.  Two 5-mm ports were placed in the right upper quadrant. All skin incisions were infiltrated with a local anesthetic agent before making the incision and placing the trocars.   We positioned the patient in reverse Trendelenburg, tilted slightly to the patient's left.  The gallbladder was identified, the fundus grasped and retracted cephalad. Omental Adhesions were lysed bluntly and with the electrocautery where indicated, taking care not to injure any adjacent organs or viscus. The infundibulum was grasped and retracted laterally, exposing the peritoneum overlying the triangle of Calot. This was then divided and exposed in a blunt fashion. A critical view of the cystic duct and cystic artery was obtained.  The cystic duct was clearly identified and bluntly dissected circumferentially.   The cystic duct was then ligated with clips and divided. The cystic artery which had been identified & dissected free was ligated with clips and divided as well.   The gallbladder was dissected from the liver bed in retrograde fashion with the electrocautery. I did have to take down some omental adhesions to the umbilical abdominal area with hook cautery. The area was inspected and there was no bleeding.  The gallbladder was removed and placed in an Ecco sac.  The gallbladder and Ecco sac were then removed through the umbilical port site. The liver bed was irrigated and inspected. Hemostasis was achieved with the electrocautery. Copious irrigation was utilized and was repeatedly aspirated until clear.  The pursestring suture was used to close the umbilical fascia.  I did place an  additional interrupted 0 vicryl suture with PMI  suture passer and laparoscopic guidance.   We again inspected the right upper quadrant for hemostasis.  The umbilical closure was inspected and there was no air leak and nothing trapped within the closure. Pneumoperitoneum was released as we removed the trocars.  4-0 Monocryl was used to close the skin.   Dermabond was applied. The patient was then extubated and brought to the recovery room in stable condition. Instrument, sponge, and needle counts were correct at closure and at the conclusion of the case.   Findings: Chronic Cholecystitis with Cholelithiasis; +critical view  Estimated Blood Loss: Minimal         Drains: none         Specimens: Gallbladder           Complications: None; patient tolerated the procedure well.         Disposition: PACU - hemodynamically stable.         Condition: stable  Leighton Ruff. Redmond Pulling, MD, FACS General, Bariatric, & Minimally Invasive Surgery [facility], [place]

## 2018-01-30 NOTE — Interval H&P Note (Signed)
History and Physical Interval Note:  01/30/2018 7:11 AM  Susan Roth  has presented today for surgery, with the diagnosis of SYMPTOMATIC CHOLELITHIASIS  The various methods of treatment have been discussed with the patient and family. After consideration of risks, benefits and other options for treatment, the patient has consented to  Procedure(s): LAPAROSCOPIC CHOLECYSTECTOMY (N/A) as a surgical intervention .  The patient's history has been reviewed, patient examined, no change in status, stable for surgery.  I have reviewed the patient's chart and labs.  Questions were answered to the patient's satisfaction.     Greer Pickerel

## 2018-01-30 NOTE — Anesthesia Preprocedure Evaluation (Signed)
Anesthesia Evaluation  Patient identified by MRN, date of birth, ID band Patient awake    Reviewed: Allergy & Precautions, NPO status , Patient's Chart, lab work & pertinent test results  Airway Mallampati: II  TM Distance: >3 FB Neck ROM: Full    Dental no notable dental hx.    Pulmonary neg pulmonary ROS,    Pulmonary exam normal breath sounds clear to auscultation       Cardiovascular negative cardio ROS Normal cardiovascular exam Rhythm:Regular Rate:Normal     Neuro/Psych negative neurological ROS  negative psych ROS   GI/Hepatic negative GI ROS, Neg liver ROS,   Endo/Other  negative endocrine ROS  Renal/GU negative Renal ROS  negative genitourinary   Musculoskeletal negative musculoskeletal ROS (+)   Abdominal   Peds negative pediatric ROS (+)  Hematology negative hematology ROS (+)   Anesthesia Other Findings   Reproductive/Obstetrics negative OB ROS                             Anesthesia Physical Anesthesia Plan  ASA: II  Anesthesia Plan: General   Post-op Pain Management:    Induction: Intravenous  PONV Risk Score and Plan: 4 or greater and Ondansetron, Dexamethasone, Midazolam, Scopolamine patch - Pre-op and Treatment may vary due to age or medical condition  Airway Management Planned: Oral ETT  Additional Equipment:   Intra-op Plan:   Post-operative Plan: Extubation in OR  Informed Consent: I have reviewed the patients History and Physical, chart, labs and discussed the procedure including the risks, benefits and alternatives for the proposed anesthesia with the patient or authorized representative who has indicated his/her understanding and acceptance.   Dental advisory given  Plan Discussed with: CRNA  Anesthesia Plan Comments:         Anesthesia Quick Evaluation

## 2018-01-30 NOTE — Anesthesia Procedure Notes (Signed)
Performed by: Maurizio Geno E, CRNA       

## 2018-01-31 ENCOUNTER — Encounter (HOSPITAL_COMMUNITY): Payer: Self-pay | Admitting: General Surgery

## 2018-02-05 ENCOUNTER — Encounter: Payer: 59 | Admitting: Family Medicine

## 2018-03-24 ENCOUNTER — Inpatient Hospital Stay: Payer: Self-pay | Admitting: Genetic Counselor

## 2018-03-24 ENCOUNTER — Inpatient Hospital Stay: Payer: Self-pay

## 2018-04-07 ENCOUNTER — Inpatient Hospital Stay: Payer: Self-pay

## 2018-04-11 ENCOUNTER — Encounter: Payer: Self-pay | Admitting: Genetic Counselor

## 2018-04-14 ENCOUNTER — Ambulatory Visit: Payer: Self-pay

## 2018-05-11 ENCOUNTER — Emergency Department (HOSPITAL_BASED_OUTPATIENT_CLINIC_OR_DEPARTMENT_OTHER)
Admission: EM | Admit: 2018-05-11 | Discharge: 2018-05-11 | Disposition: A | Discharge status: Home / Self Care | Payer: No Typology Code available for payment source | Attending: Emergency Medicine | Admitting: Emergency Medicine

## 2018-05-11 ENCOUNTER — Emergency Department (HOSPITAL_BASED_OUTPATIENT_CLINIC_OR_DEPARTMENT_OTHER): Payer: No Typology Code available for payment source

## 2018-05-11 ENCOUNTER — Encounter (HOSPITAL_BASED_OUTPATIENT_CLINIC_OR_DEPARTMENT_OTHER): Payer: Self-pay | Admitting: Emergency Medicine

## 2018-05-11 ENCOUNTER — Other Ambulatory Visit: Payer: Self-pay

## 2018-05-11 DIAGNOSIS — S39012A Strain of muscle, fascia and tendon of lower back, initial encounter: Secondary | ICD-10-CM

## 2018-05-11 DIAGNOSIS — Y92410 Unspecified street and highway as the place of occurrence of the external cause: Secondary | ICD-10-CM | POA: Diagnosis not present

## 2018-05-11 DIAGNOSIS — Y9389 Activity, other specified: Secondary | ICD-10-CM | POA: Diagnosis not present

## 2018-05-11 DIAGNOSIS — Y999 Unspecified external cause status: Secondary | ICD-10-CM | POA: Diagnosis not present

## 2018-05-11 DIAGNOSIS — M542 Cervicalgia: Secondary | ICD-10-CM | POA: Diagnosis not present

## 2018-05-11 DIAGNOSIS — S3992XA Unspecified injury of lower back, initial encounter: Secondary | ICD-10-CM | POA: Diagnosis present

## 2018-05-11 DIAGNOSIS — Z7722 Contact with and (suspected) exposure to environmental tobacco smoke (acute) (chronic): Secondary | ICD-10-CM | POA: Insufficient documentation

## 2018-05-11 DIAGNOSIS — M545 Low back pain: Secondary | ICD-10-CM | POA: Diagnosis not present

## 2018-05-11 MED ORDER — METHOCARBAMOL 500 MG PO TABS: 500.0000 mg | ORAL_TABLET | Freq: Two times a day (BID) | ORAL | 0 refills | Status: DC
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
## 2018-05-11 NOTE — ED Notes (Signed)
Pt in radiology 

## 2018-05-22 ENCOUNTER — Other Ambulatory Visit: Payer: Self-pay | Admitting: Obstetrics & Gynecology

## 2018-05-22 DIAGNOSIS — Z1231 Encounter for screening mammogram for malignant neoplasm of breast: Secondary | ICD-10-CM

## 2018-07-14 ENCOUNTER — Ambulatory Visit (INDEPENDENT_AMBULATORY_CARE_PROVIDER_SITE_OTHER): Payer: BLUE CROSS/BLUE SHIELD | Admitting: Obstetrics & Gynecology

## 2018-07-14 ENCOUNTER — Encounter: Payer: Self-pay | Admitting: Obstetrics & Gynecology

## 2018-07-14 VITALS — BP 120/80 | HR 75 | Ht 64.0 in | Wt 215.0 lb

## 2018-07-14 DIAGNOSIS — Z800 Family history of malignant neoplasm of digestive organs: Secondary | ICD-10-CM

## 2018-07-14 DIAGNOSIS — Z124 Encounter for screening for malignant neoplasm of cervix: Secondary | ICD-10-CM

## 2018-07-14 DIAGNOSIS — N926 Irregular menstruation, unspecified: Secondary | ICD-10-CM | POA: Diagnosis not present

## 2018-07-14 DIAGNOSIS — Z1151 Encounter for screening for human papillomavirus (HPV): Secondary | ICD-10-CM

## 2018-07-14 DIAGNOSIS — N942 Vaginismus: Secondary | ICD-10-CM | POA: Diagnosis not present

## 2018-07-14 DIAGNOSIS — R635 Abnormal weight gain: Secondary | ICD-10-CM

## 2018-07-14 DIAGNOSIS — Z01419 Encounter for gynecological examination (general) (routine) without abnormal findings: Secondary | ICD-10-CM | POA: Diagnosis not present

## 2018-07-14 DIAGNOSIS — D500 Iron deficiency anemia secondary to blood loss (chronic): Secondary | ICD-10-CM | POA: Diagnosis not present

## 2018-07-14 NOTE — Progress Notes (Signed)
Last pap- 05/03/15- negative
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
## 2018-07-15 ENCOUNTER — Other Ambulatory Visit: Payer: Self-pay | Admitting: Obstetrics & Gynecology

## 2018-07-15 LAB — COMPREHENSIVE METABOLIC PANEL
AG RATIO: 1.4 (calc) (ref 1.0–2.5)
ALT: 9 U/L (ref 6–29)
AST: 11 U/L (ref 10–30)
Albumin: 4.2 g/dL (ref 3.6–5.1)
Alkaline phosphatase (APISO): 70 U/L (ref 31–125)
BUN: 12 mg/dL (ref 7–25)
CALCIUM: 9.2 mg/dL (ref 8.6–10.2)
CO2: 27 mmol/L (ref 20–32)
Chloride: 102 mmol/L (ref 98–110)
Creat: 0.84 mg/dL (ref 0.50–1.10)
GLUCOSE: 97 mg/dL (ref 65–99)
Globulin: 3 g/dL (calc) (ref 1.9–3.7)
Potassium: 4 mmol/L (ref 3.5–5.3)
SODIUM: 137 mmol/L (ref 135–146)
TOTAL PROTEIN: 7.2 g/dL (ref 6.1–8.1)
Total Bilirubin: 0.5 mg/dL (ref 0.2–1.2)

## 2018-07-15 LAB — LIPID PANEL
Cholesterol: 193 mg/dL (ref <200)
HDL: 61 mg/dL (ref >=50)
LDL CHOLESTEROL (CALC): 114 mg/dL — AB
NON-HDL CHOLESTEROL (CALC): 132 mg/dL — AB (ref <130)
TRIGLYCERIDES: 82 mg/dL (ref <150)
Total CHOL/HDL Ratio: 3.2 (calc) (ref <5.0)

## 2018-07-15 LAB — HEMOGLOBIN A1C
HEMOGLOBIN A1C: 5 %{Hb} (ref <5.7)
Mean Plasma Glucose: 97 (calc)
eAG (mmol/L): 5.4 (calc)

## 2018-07-15 LAB — CBC
HCT: 42.6 % (ref 35.0–45.0)
HEMOGLOBIN: 14.7 g/dL (ref 11.7–15.5)
MCH: 31.7 pg (ref 27.0–33.0)
MCHC: 34.5 g/dL (ref 32.0–36.0)
MCV: 92 fL (ref 80.0–100.0)
MPV: 10 fL (ref 7.5–12.5)
PLATELETS: 372 10*3/uL (ref 140–400)
RBC: 4.63 10*6/uL (ref 3.80–5.10)
RDW: 12.5 % (ref 11.0–15.0)
WBC: 4.2 10*3/uL (ref 3.8–10.8)

## 2018-07-15 LAB — TSH: TSH: 2.37 mIU/L

## 2018-07-15 LAB — VITAMIN D 25 HYDROXY (VIT D DEFICIENCY, FRACTURES): Vit D, 25-Hydroxy: 17 ng/mL — ABNORMAL LOW (ref 30–100)

## 2018-07-15 MED ORDER — VITAMIN D (ERGOCALCIFEROL) 1.25 MG (50000 UNIT) PO CAPS: 50000.0000 [IU] | ORAL_CAPSULE | ORAL | 0 refills | Status: DC

## 2018-07-15 NOTE — Progress Notes (Signed)
Vitamin D prescribed.

## 2018-07-16 LAB — CYTOLOGY - PAP: HPV: NOT DETECTED

## 2018-07-17 ENCOUNTER — Encounter: Payer: Self-pay | Admitting: *Deleted

## 2018-07-23 ENCOUNTER — Telehealth: Payer: Self-pay | Admitting: *Deleted

## 2018-07-23 NOTE — Telephone Encounter (Signed)
-----   Message from Emily Filbert, MD sent at 07/17/2018  8:13 AM EST ----- Her pap showed AGUS so she needs a colpo, ECC, and EMBX :(

## 2018-07-23 NOTE — Telephone Encounter (Signed)
Pt notified via phone of abnormal pap.  She is schedule for Colpo, ECC and Endo BX with Dr Hulan Fray 08/04/18

## 2018-08-04 ENCOUNTER — Encounter: Payer: BLUE CROSS/BLUE SHIELD | Admitting: Obstetrics & Gynecology

## 2018-08-18 ENCOUNTER — Ambulatory Visit (INDEPENDENT_AMBULATORY_CARE_PROVIDER_SITE_OTHER): Payer: BLUE CROSS/BLUE SHIELD | Admitting: Obstetrics & Gynecology

## 2018-08-18 ENCOUNTER — Encounter: Payer: Self-pay | Admitting: *Deleted

## 2018-08-18 ENCOUNTER — Other Ambulatory Visit: Payer: Self-pay

## 2018-08-18 ENCOUNTER — Encounter: Payer: Self-pay | Admitting: Obstetrics & Gynecology

## 2018-08-18 VITALS — BP 116/76 | HR 86 | Temp 98.2°F | Resp 16 | Ht 64.0 in | Wt 215.0 lb

## 2018-08-18 DIAGNOSIS — Z01812 Encounter for preprocedural laboratory examination: Secondary | ICD-10-CM

## 2018-08-18 DIAGNOSIS — N840 Polyp of corpus uteri: Secondary | ICD-10-CM

## 2018-08-18 DIAGNOSIS — R87619 Unspecified abnormal cytological findings in specimens from cervix uteri: Secondary | ICD-10-CM

## 2018-08-18 DIAGNOSIS — Z3202 Encounter for pregnancy test, result negative: Secondary | ICD-10-CM | POA: Diagnosis not present

## 2018-08-18 LAB — POCT URINE PREGNANCY: PREG TEST UR: NEGATIVE

## 2018-08-18 NOTE — Progress Notes (Signed)
   Subjective:    Patient ID: Susan Roth, female    DOB: [birth year], 43 y.o.   MRN: [id number]  HPI 43 yo P22 (47 yo daughter) here for a colpo and EMBX due to a pap on 07/14/18 that showed AGCUS. HR HPV was negative.   Review of Systems She has had a bilateral salpingectomy in the past and uses condoms.    Objective:   Physical Exam  Breathing, conversing, and ambulating normally Well nourished, well hydrated Black female, no apparent distress UPT negative, consent signed, time out done Cervix prepped with acetic acid. Transformation zone seen in its entirety. Colpo adequate. A very small amount of acetowhite at the 9 o'clock position, quickly resolved when the vinegar was removed. I biopsied this area and silver nitrate achieved hemostasis. ECC obtained.  Cervix prepped with betadine and grasped with a single tooth tenaculum Uterus sounded to 9 cm Pipelle used for 2 passes with a moderate amount of tissue obtained. She tolerated the procedure well.      Assessment & Plan:  AGUS - await pathology  She has Mychart.

## 2018-10-01 ENCOUNTER — Other Ambulatory Visit: Payer: Self-pay | Admitting: Obstetrics & Gynecology

## 2018-10-20 ENCOUNTER — Encounter: Payer: Self-pay | Admitting: Internal Medicine

## 2018-10-27 ENCOUNTER — Other Ambulatory Visit: Payer: Self-pay

## 2018-10-27 ENCOUNTER — Ambulatory Visit (AMBULATORY_SURGERY_CENTER): Payer: Self-pay | Admitting: *Deleted

## 2018-10-27 VITALS — Ht 64.0 in | Wt 215.0 lb

## 2018-10-27 DIAGNOSIS — Z800 Family history of malignant neoplasm of digestive organs: Secondary | ICD-10-CM

## 2018-10-27 NOTE — Progress Notes (Signed)
No egg or soy allergy known to patient  No issues with past sedation with any surgeries  or procedures, no intubation problems -  No diet pills per patient No home 02 use per patient  No blood thinners per patient  Pt states  issues with constipation - pt uses Laxatives but has constipation daily-  When she has a BM is typically hard balls  No A fib or A flutter  EMMI video sent to pt's e mail    Pt verified name, DOB, address and insurance during PV today. Pt mailed instruction packet to included paper to complete and mail back to [facility] with addressed and stamped envelope, Emmi video, copy of consent form to read and not return, and instructions. PV completed over the phone. Pt encouraged to call with questions or issues   Pt is aware that care partner will wait in the car during parking lot; if they feel like they will be too hot to wait in the car; they may wait in the lobby.  We want them to wear a mask (we do not have any that we can provide them), practice social distancing, and we will check their temperatures when they get here.  I did remind patient that their care partner needs to stay in the parking lot the entire time. Pt will wear mask into building

## 2018-10-29 ENCOUNTER — Encounter: Payer: Self-pay | Admitting: Internal Medicine

## 2018-11-04 ENCOUNTER — Telehealth: Payer: Self-pay | Admitting: Internal Medicine

## 2018-11-04 NOTE — Telephone Encounter (Signed)
Covid-19 Screening Questions  LEFT MESSAGE FOR PT TO CB Covid-19 Screening Questions

## 2018-11-04 NOTE — Telephone Encounter (Signed)
Pt return call °

## 2018-11-05 ENCOUNTER — Other Ambulatory Visit: Payer: Self-pay

## 2018-11-05 ENCOUNTER — Encounter: Payer: Self-pay | Admitting: Internal Medicine

## 2018-11-05 ENCOUNTER — Ambulatory Visit (AMBULATORY_SURGERY_CENTER): Payer: BC Managed Care – PPO | Admitting: Internal Medicine

## 2018-11-05 ENCOUNTER — Telehealth: Payer: Self-pay

## 2018-11-05 VITALS — BP 109/78 | HR 65 | Temp 99.1°F | Resp 14 | Ht 64.0 in | Wt 215.0 lb

## 2018-11-05 DIAGNOSIS — Z800 Family history of malignant neoplasm of digestive organs: Secondary | ICD-10-CM

## 2018-11-05 DIAGNOSIS — Z85038 Personal history of other malignant neoplasm of large intestine: Secondary | ICD-10-CM | POA: Diagnosis not present

## 2018-11-05 DIAGNOSIS — Z1211 Encounter for screening for malignant neoplasm of colon: Secondary | ICD-10-CM | POA: Diagnosis not present

## 2018-11-05 MED ORDER — SODIUM CHLORIDE 0.9 % IV SOLN: 500.0000 mL | Freq: Once | INTRAVENOUS | Status: DC

## 2018-11-05 NOTE — Patient Instructions (Addendum)
The colonoscopy was normal.  As we discussed, my office staff will arrange for you to meet with a genetics counselor to better characterize your situation regarding family history of colon cancer and Lynch Syndrome. This will help your health care team to better understand your risks and to keep you healthy and cancer-free.   I appreciate the opportunity to care for you. Gatha Mayer, MD, FACG  YOU HAD AN ENDOSCOPIC PROCEDURE TODAY AT [facility]:   Refer to the procedure report that was given to you for any specific questions about what was found during the examination.  If the procedure report does not answer your questions, please call your gastroenterologist to clarify.  If you requested that your care partner not be given the details of your procedure findings, then the procedure report has been included in a sealed envelope for you to review at your convenience later.  YOU SHOULD EXPECT: Some feelings of bloating in the abdomen. Passage of more gas than usual.  Walking can help get rid of the air that was put into your GI tract during the procedure and reduce the bloating. If you had a lower endoscopy (such as a colonoscopy or flexible sigmoidoscopy) you may notice spotting of blood in your stool or on the toilet paper. If you underwent a bowel prep for your procedure, you may not have a normal bowel movement for a few days.  Please Note:  You might notice some irritation and congestion in your nose or some drainage.  This is from the oxygen used during your procedure.  There is no need for concern and it should clear up in a day or so.  SYMPTOMS TO REPORT IMMEDIATELY:   Following lower endoscopy (colonoscopy or flexible sigmoidoscopy):  Excessive amounts of blood in the stool  Significant tenderness or worsening of abdominal pains  Swelling of the abdomen that is new, acute  Fever of 100F or higher   For urgent or emergent issues, a gastroenterologist can  be reached at any hour by calling [phone].   DIET:  We do recommend a small meal at first, but then you may proceed to your regular diet.  Drink plenty of fluids but you should avoid alcoholic beverages for 24 hours.  ACTIVITY:  You should plan to take it easy for the rest of today and you should NOT DRIVE or use heavy machinery until tomorrow (because of the sedation medicines used during the test).    FOLLOW UP: Our staff will call the number listed on your records 48-72 hours following your procedure to check on you and address any questions or concerns that you may have regarding the information given to you following your procedure. If we do not reach you, we will leave a message.  We will attempt to reach you two times.  During this call, we will ask if you have developed any symptoms of COVID 19. If you develop any symptoms (ie: fever, flu-like symptoms, shortness of breath, cough etc.) before then, please call [phone].  If you test positive for Covid 19 in the 2 weeks post procedure, please call and report this information to [place].    If any biopsies were taken you will be contacted by phone or by letter within the next 1-3 weeks.  Please call us at [phone] if you have not heard about the biopsies in 3 weeks.    SIGNATURES/CONFIDENTIALITY: You and/or your care partner have signed paperwork which will be entered  into your electronic medical record.  These signatures attest to the fact that that the information above on your After Visit Summary has been reviewed and is understood.  Full responsibility of the confidentiality of this discharge information lies with you and/or your care-partner.

## 2018-11-05 NOTE — Telephone Encounter (Signed)
-----   Message from Gatha Mayer, MD sent at 11/05/2018 12:16 PM EDT ----- Regarding: Genetics eval Please refer to genetics re: family history of Lynch syndrome  Thanks

## 2018-11-05 NOTE — Telephone Encounter (Signed)
Referral placed.

## 2018-11-05 NOTE — Op Note (Signed)
[place] Patient Name: Susan Roth Procedure Date: 11/05/2018 11:15 AM MRN: [id number] Endoscopist: Gatha Mayer , MD Age: 43 Referring MD:  Date of Birth: 10/16/1975 Gender: Female Account #: [id number] Procedure:                Colonoscopy Indications:              Screening in patient at increased risk: Colorectal                            cancer in father before age 69 Medicines:                Propofol per Anesthesia, Monitored Anesthesia Care Procedure:                Pre-Anesthesia Assessment:                           - Prior to the procedure, a History and Physical                            was performed, and patient medications and                            allergies were reviewed. The patient's tolerance of                            previous anesthesia was also reviewed. The risks                            and benefits of the procedure and the sedation                            options and risks were discussed with the patient.                            All questions were answered, and informed consent                            was obtained. Prior Anticoagulants: The patient has                            taken no previous anticoagulant or antiplatelet                            agents. ASA Grade Assessment: I - A normal, healthy                            patient. After reviewing the risks and benefits,                            the patient was deemed in satisfactory condition to                            undergo the procedure.  After obtaining informed consent, the colonoscope                            was passed under direct vision. Throughout the                            procedure, the patient's blood pressure, pulse, and                            oxygen saturations were monitored continuously. The                            Colonoscope was introduced through the anus and                            advanced to the  the cecum, identified by                            appendiceal orifice and ileocecal valve. The                            colonoscopy was performed without difficulty. The                            patient tolerated the procedure well. The quality                            of the bowel preparation was excellent. The                            ileocecal valve, appendiceal orifice, and rectum                            were photographed. The bowel preparation used was                            Miralax via split dose instruction. Scope In: 11:20:56 AM Scope Out: 11:32:17 AM Scope Withdrawal Time: 0 hours 8 minutes 55 seconds  Total Procedure Duration: 0 hours 11 minutes 21 seconds  Findings:                 The perianal and digital rectal examinations were                            normal.                           The entire examined colon appeared normal on direct                            and retroflexion views. Complications:            No immediate complications. Estimated Blood Loss:     Estimated blood loss: none. Impression:               - The entire examined colon is  normal on direct and                            retroflexion views.                           - No specimens collected. Recommendation:           - Patient has a contact number available for                            emergencies. The signs and symptoms of potential                            delayed complications were discussed with the                            patient. Return to normal activities tomorrow.                            Written discharge instructions were provided to the                            patient.                           - Resume previous diet.                           - Continue present medications.                           - Repeat colonoscopy in 1 year for screening                            purposes.                           - She says Lynch Syndrome in father adn  half-sister                            - has multiple close and distant relatives with                            colon cancer so this seems likely.                           WE WILL SCHEDULE GENETICS COUNSELING APPOINTMENT                            FOR HER Gatha Mayer, MD 11/05/2018 11:43:09 AM This report has been signed electronically.

## 2018-11-05 NOTE — Progress Notes (Signed)
Report given to PACU, vss 

## 2018-11-06 ENCOUNTER — Telehealth: Payer: Self-pay | Admitting: Genetic Counselor

## 2018-11-06 ENCOUNTER — Encounter: Payer: Self-pay | Admitting: Genetic Counselor

## 2018-11-06 NOTE — Telephone Encounter (Signed)
Received a genetic counseling referral from Dr. Carlean Purl for fhx of lynch syndrome and colon cancer. I scheduled the pt to see Roma Kayser on 7/20 at 2pm. I lft the appt date and time on the pt's vm. Letter mailed.

## 2018-11-07 ENCOUNTER — Telehealth: Payer: Self-pay | Admitting: *Deleted

## 2018-11-07 NOTE — Telephone Encounter (Signed)
  Follow up Call-  Call back number 11/05/2018  Post procedure Call Back phone  # [phone]  Permission to leave phone message Yes  Some recent data might be hidden    1. Have you developed a fever since your procedure? no  2.   Have you had an respiratory symptoms (SOB or cough) since your procedure? no  3.   Have you tested positive for COVID 19 since your procedure no  4.   Have you had any family members/close contacts diagnosed with the COVID 19 since your procedure?  no   If yes to any of these questions please route to Joylene John, RN and Alphonsa Gin, Therapist, sports.  Patient questions:  Do you have a fever, pain , or abdominal swelling? No. Pain Score  0 *  Have you tolerated food without any problems? Yes.    Have you been able to return to your normal activities? Yes.    Do you have any questions about your discharge instructions: Diet   No. Medications  No. Follow up visit  No.  Do you have questions or concerns about your Care? No.  Actions: * If pain score is 4 or above: No action needed, pain <4.

## 2018-11-26 ENCOUNTER — Other Ambulatory Visit: Payer: Self-pay

## 2018-11-26 ENCOUNTER — Ambulatory Visit
Admission: RE | Admit: 2018-11-26 | Discharge: 2018-11-26 | Disposition: A | Discharge status: Home / Self Care | Payer: BC Managed Care – PPO | Source: Ambulatory Visit | Attending: Obstetrics & Gynecology | Admitting: Obstetrics & Gynecology

## 2018-11-26 DIAGNOSIS — Z1231 Encounter for screening mammogram for malignant neoplasm of breast: Secondary | ICD-10-CM | POA: Diagnosis not present

## 2018-12-05 ENCOUNTER — Telehealth: Payer: Self-pay | Admitting: Genetic Counselor

## 2018-12-05 NOTE — Telephone Encounter (Signed)
Called patient regarding upcoming Webex appointment, per patient's request appointment has been moved from 07/20 to 08/03 and it will be a virtual visit. Invite has been sent and patient will be needing a salvia kit.

## 2018-12-08 ENCOUNTER — Inpatient Hospital Stay: Payer: BC Managed Care – PPO | Admitting: Genetic Counselor

## 2018-12-18 ENCOUNTER — Encounter: Payer: Self-pay | Admitting: Genetic Counselor

## 2018-12-19 ENCOUNTER — Telehealth: Payer: Self-pay | Admitting: Genetic Counselor

## 2018-12-19 NOTE — Telephone Encounter (Signed)
Left voicemail to confirm appt ant verify info.

## 2018-12-19 NOTE — Telephone Encounter (Signed)
Called patient regarding upcoming Webex appointment, left a voicemail and this will be a walk-in visit due to no communication to set it up as virtual. Labs added.

## 2018-12-22 ENCOUNTER — Inpatient Hospital Stay: Payer: BC Managed Care – PPO | Attending: Genetic Counselor | Admitting: Genetic Counselor

## 2018-12-22 ENCOUNTER — Other Ambulatory Visit: Payer: BC Managed Care – PPO

## 2018-12-22 ENCOUNTER — Other Ambulatory Visit: Payer: Self-pay | Admitting: Genetic Counselor

## 2018-12-22 DIAGNOSIS — Z800 Family history of malignant neoplasm of digestive organs: Secondary | ICD-10-CM

## 2019-01-02 ENCOUNTER — Other Ambulatory Visit: Payer: Self-pay | Admitting: Obstetrics & Gynecology

## 2019-01-07 ENCOUNTER — Other Ambulatory Visit: Payer: Self-pay | Admitting: Obstetrics & Gynecology

## 2019-01-07 DIAGNOSIS — E559 Vitamin D deficiency, unspecified: Secondary | ICD-10-CM

## 2019-01-07 NOTE — Progress Notes (Signed)
Patient instructed to schedule a Vit d level blood draw. Order placed.

## 2019-02-25 ENCOUNTER — Other Ambulatory Visit (HOSPITAL_COMMUNITY)
Admission: RE | Admit: 2019-02-25 | Discharge: 2019-02-25 | Disposition: A | Discharge status: Home / Self Care | Payer: BC Managed Care – PPO | Source: Ambulatory Visit | Attending: Medical | Admitting: Medical

## 2019-02-25 ENCOUNTER — Ambulatory Visit: Payer: BC Managed Care – PPO | Admitting: Medical

## 2019-02-25 ENCOUNTER — Other Ambulatory Visit: Payer: Self-pay

## 2019-02-25 ENCOUNTER — Encounter: Payer: Self-pay | Admitting: Medical

## 2019-02-25 VITALS — BP 110/73 | HR 77 | Temp 96.6°F | Resp 16 | Ht 64.0 in | Wt 206.2 lb

## 2019-02-25 DIAGNOSIS — R350 Frequency of micturition: Secondary | ICD-10-CM | POA: Diagnosis not present

## 2019-02-25 DIAGNOSIS — Z113 Encounter for screening for infections with a predominantly sexual mode of transmission: Secondary | ICD-10-CM | POA: Insufficient documentation

## 2019-02-25 DIAGNOSIS — N898 Other specified noninflammatory disorders of vagina: Secondary | ICD-10-CM

## 2019-02-25 DIAGNOSIS — R300 Dysuria: Secondary | ICD-10-CM

## 2019-02-25 LAB — POC URINALSYSI DIPSTICK (AUTOMATED)
Bilirubin, UA: NEGATIVE
Blood, UA: NEGATIVE
Glucose, UA: NEGATIVE
Ketones, UA: NEGATIVE
Leukocytes, UA: NEGATIVE
Nitrite, UA: NEGATIVE
Protein, UA: NEGATIVE
Spec Grav, UA: 1.015 (ref 1.010–1.025)
Urobilinogen, UA: NEGATIVE E.U./dL — AB
pH, UA: 6.5 (ref 5.0–8.0)

## 2019-02-25 MED ORDER — FLUCONAZOLE 150 MG PO TABS: 150.0000 mg | ORAL_TABLET | Freq: Once | ORAL | 0 refills | Status: AC

## 2019-02-25 NOTE — Patient Instructions (Addendum)
With your current symptoms I think the best way to approach diagnosis and treatment is to give you Diflucan 1 tablet for 1day as we await urine culture and urine ancillary results.  Pending your study results if your symptoms change or worsen please let us know.  Your back pain has improved and minimal area tender on palpation.  This area is above kidney area so I think it is muscle and upper back/lower rib region.  At this pain persists or changes please let us know.  In that event would consider rib series and chest x-ray.  Follow-up date to be determined depending on lab results and clinical response to treatment.
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
## 2019-02-26 ENCOUNTER — Encounter: Payer: Self-pay | Admitting: Medical

## 2019-02-26 LAB — URINE CULTURE
MICRO NUMBER:: 964203
SPECIMEN QUALITY:: ADEQUATE

## 2019-03-18 ENCOUNTER — Encounter: Payer: Self-pay | Admitting: Medical

## 2019-03-19 DIAGNOSIS — N3000 Acute cystitis without hematuria: Secondary | ICD-10-CM | POA: Diagnosis not present

## 2019-04-22 IMAGING — DX DG CHEST 2V
2 series · 2 of 2 positions shown · non-contrast
Comparison: PA and lateral chest x-ray December 26, 2014

CLINICAL DATA: Chronic bilateral lower extremity swelling and foot
pain with increased severity over the past week. Nonsmoker.

EXAM:
CHEST - 2 VIEW

[chest pa]
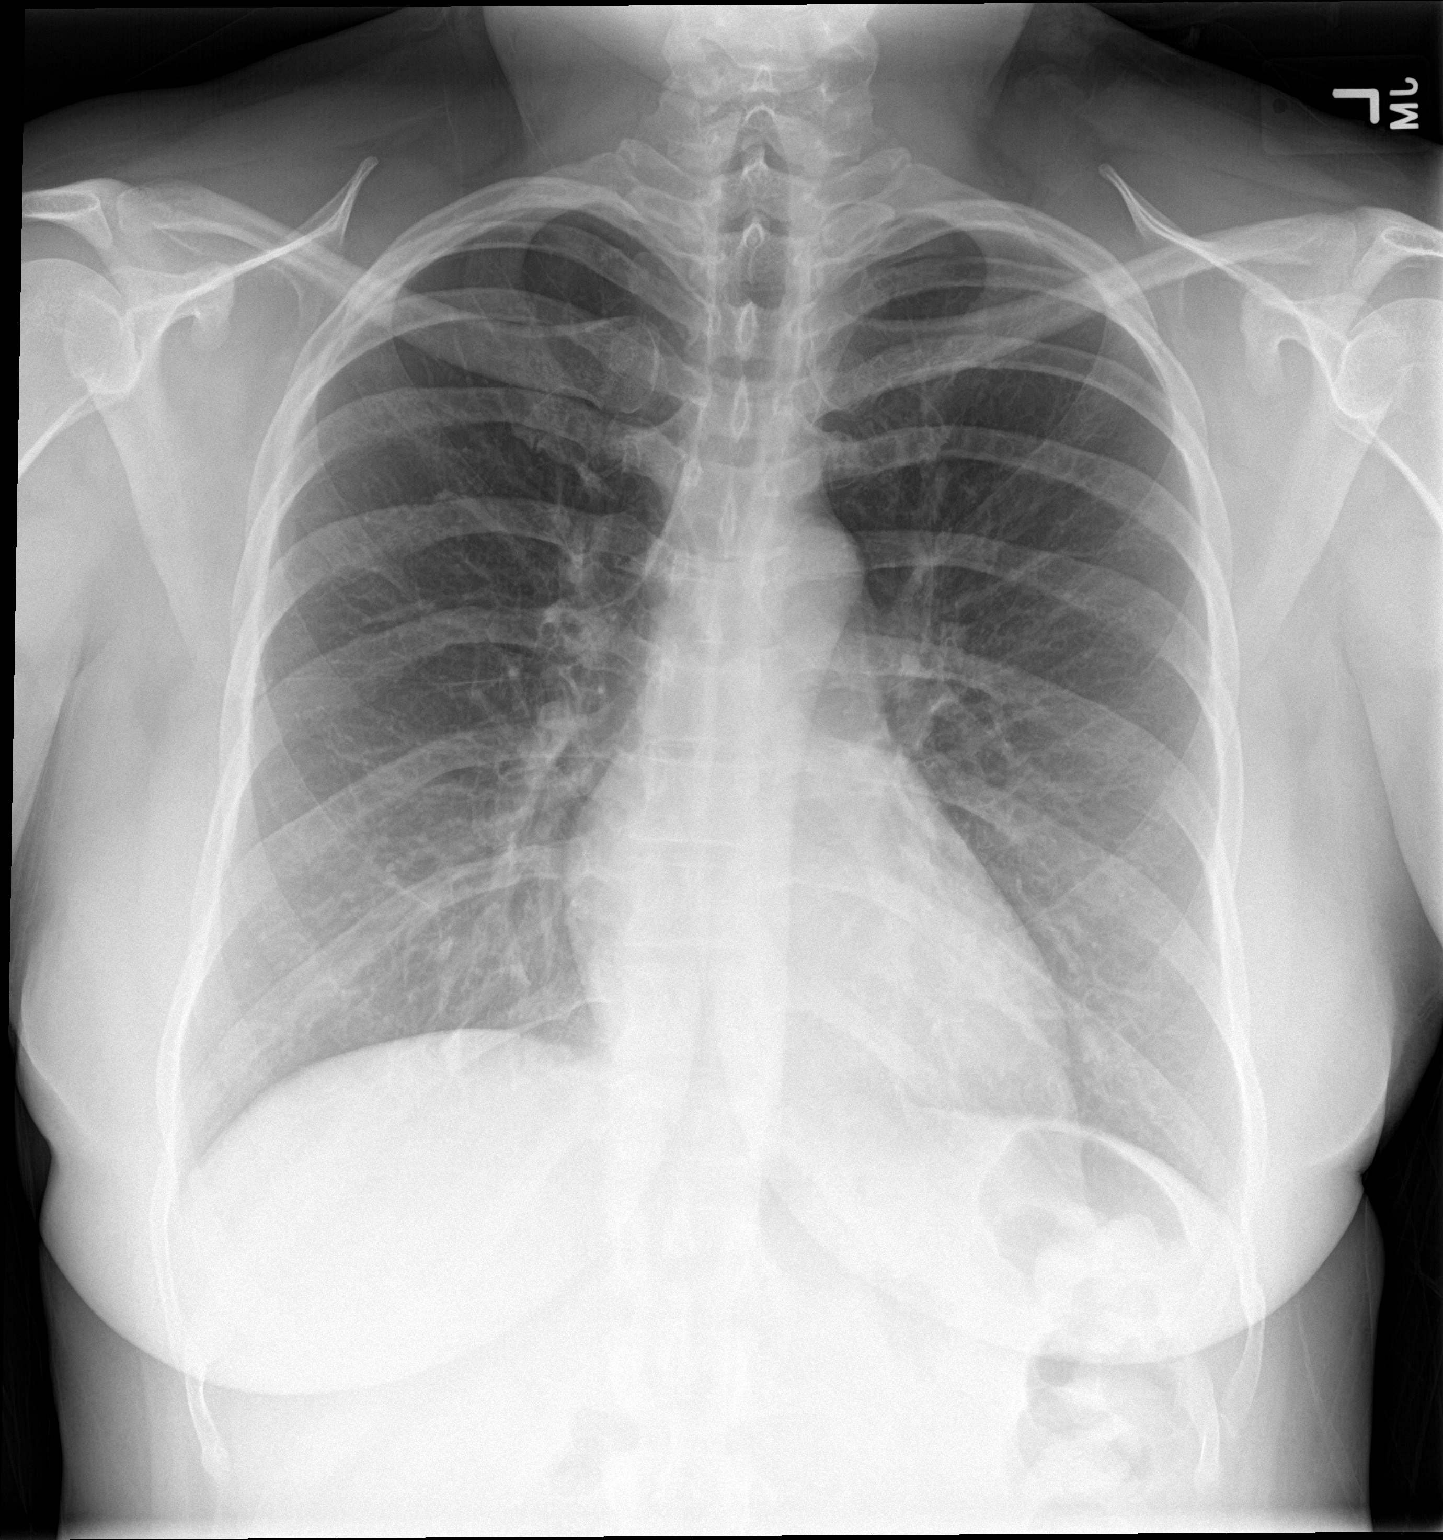

[chest lat]
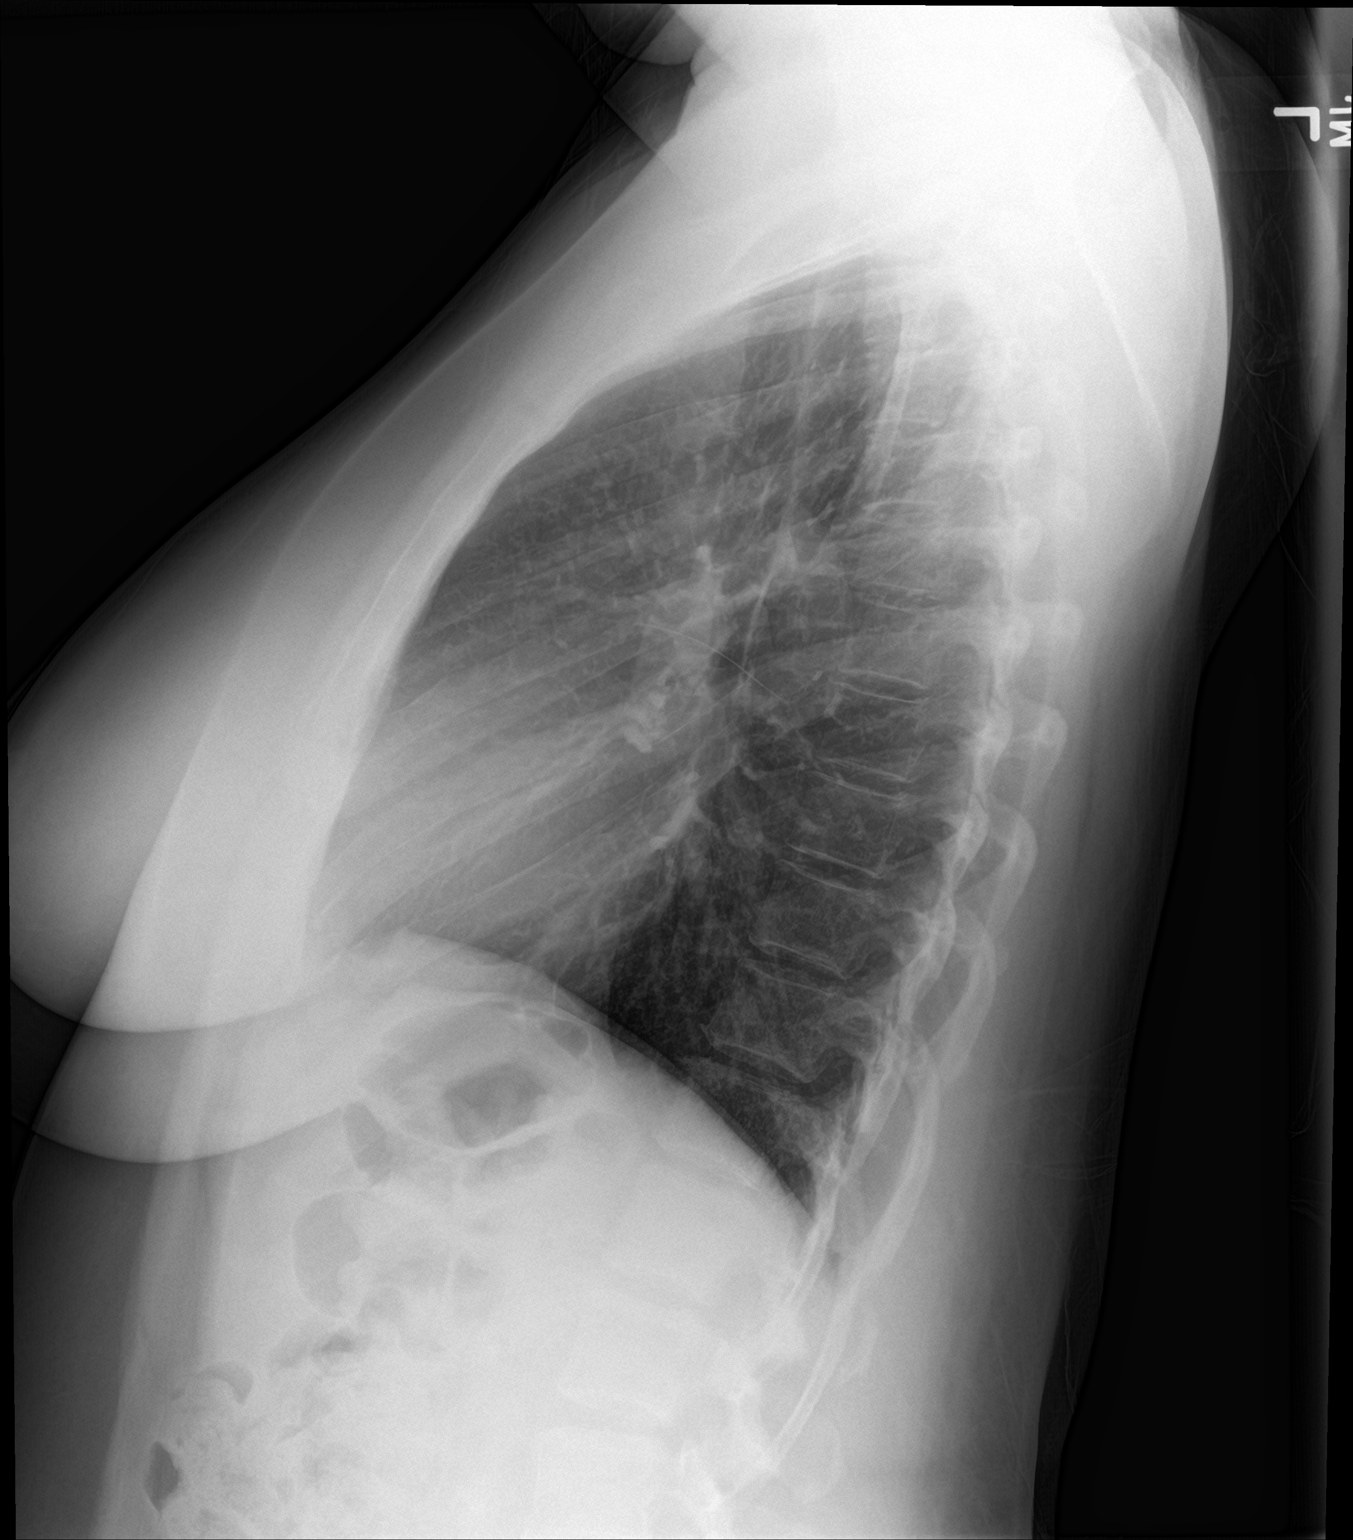

[2 of 2 positions shown; findings below may reference images not displayed]

FINDINGS: The lungs are well-expanded and clear. The heart and pulmonary
vascularity are normal. The mediastinum is normal in width. There is
no pleural effusion. The bony thorax exhibits no acute abnormality.
IMPRESSION: There is no active cardiopulmonary disease.

## 2019-09-28 DIAGNOSIS — E7800 Pure hypercholesterolemia, unspecified: Secondary | ICD-10-CM | POA: Diagnosis not present

## 2019-09-28 DIAGNOSIS — E559 Vitamin D deficiency, unspecified: Secondary | ICD-10-CM | POA: Diagnosis not present

## 2019-09-28 DIAGNOSIS — N951 Menopausal and female climacteric states: Secondary | ICD-10-CM | POA: Diagnosis not present

## 2019-09-28 DIAGNOSIS — R635 Abnormal weight gain: Secondary | ICD-10-CM | POA: Diagnosis not present

## 2019-09-28 DIAGNOSIS — R7301 Impaired fasting glucose: Secondary | ICD-10-CM | POA: Diagnosis not present

## 2019-10-12 DIAGNOSIS — N951 Menopausal and female climacteric states: Secondary | ICD-10-CM | POA: Diagnosis not present

## 2019-10-12 DIAGNOSIS — G479 Sleep disorder, unspecified: Secondary | ICD-10-CM | POA: Diagnosis not present

## 2019-10-12 DIAGNOSIS — Z1331 Encounter for screening for depression: Secondary | ICD-10-CM | POA: Diagnosis not present

## 2019-10-12 DIAGNOSIS — R7301 Impaired fasting glucose: Secondary | ICD-10-CM | POA: Diagnosis not present

## 2019-10-12 DIAGNOSIS — E559 Vitamin D deficiency, unspecified: Secondary | ICD-10-CM | POA: Diagnosis not present

## 2019-10-12 DIAGNOSIS — Z1339 Encounter for screening examination for other mental health and behavioral disorders: Secondary | ICD-10-CM | POA: Diagnosis not present
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
## 2019-12-08 DIAGNOSIS — Z20822 Contact with and (suspected) exposure to covid-19: Secondary | ICD-10-CM | POA: Diagnosis not present

## 2019-12-08 DIAGNOSIS — Z03818 Encounter for observation for suspected exposure to other biological agents ruled out: Secondary | ICD-10-CM | POA: Diagnosis not present

## 2020-05-31 NOTE — Progress Notes (Deleted)
[place] at [facility] [address] [place], [place] [address] [phone]  Date:  06/02/2020   Name:  Susan Roth   DOB:  1975/12/26   MRN:  [id number]  PCP:  Darreld Mclean, MD    Chief Complaint: No chief complaint on file.   History of Present Illness:  Susan Roth is a 45 y.o. very pleasant female patient who presents with the following:  Here today for a CPE Last seen by myself 2019  Hep C screen covid series Tetanus Flu Colon screening now due  Labs are due   GYN care per Dr Hulan Fray She has a young adult daughter  Patient Active Problem List   Diagnosis Date Noted  . Family history of Lynch syndrome 11/05/2018  . Routine general medical examination at a health care facility 09/22/2015  . 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 Right flank pain 09/07/2015  . Scalp irritation 07/01/2015  . Low back pain 12/06/2014  . RUQ discomfort 07/20/2014  . Sinusitis, acute 04/06/2014  . Paresthesia 02/09/2013  . Urethritis 12/30/2012  . IBS (irritable bowel syndrome) 11/13/2012  . Admission for sterilization 08/04/2012  . Dysmenorrhea 08/04/2012  . Obesity 06/25/2012  . 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 Anxiety and depression 09/26/2010  . ACNE, MILD 09/27/2008  . HERPES ZOSTER 09/17/2008  . Allergic rhinitis due to other allergen 09/17/2008    Past Medical History:  Diagnosis Date  . Abnormal Pap smear of cervix    cryo  . Allergic rhinitis   . Allergy   . Constipation    is every day- pt off Linzess   . Dysfunction of eustachian tube   . Endometriosis   . GERD (gastroesophageal reflux disease)    occasional  . Herpes zoster without mention of complication   . History of kidney stones   . IBS (irritable bowel syndrome)   . Neuropathy    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   . Obesity     Past Surgical History:  Procedure Laterality Date  . BILATERAL SALPINGECTOMY Right 08/04/2012    Procedure: BILATERAL SALPINGECTOMY;  Surgeon: Emily Filbert, MD;  Location: [facility];  Service: Gynecology;  Laterality: Right;  . CESAREAN SECTION  1998   x1  . CHOLECYSTECTOMY N/A 01/30/2018   Procedure: LAPAROSCOPIC CHOLECYSTECTOMY;  Surgeon: Greer Pickerel, MD;  Location: [facility];  Service: General;  Laterality: N/A;  . COLONOSCOPY    . LAPAROSCOPIC TUBAL LIGATION N/A 08/04/2012   Procedure: LAPAROSCOPIC TUBAL LIGATION;  Surgeon: Emily Filbert, MD;  Location: [facility];  Service: Gynecology;  Laterality: N/A;  Operative lsc for right salpingectomy & Mirena insertion- pt had a  bilat salphingectomy NOT tubal   . TONSILLECTOMY     as an adult - 2011  . WISDOM TOOTH EXTRACTION      Social History   Tobacco Use  . Smoking status: Never Smoker  . Smokeless tobacco: Never Used  . Tobacco comment: positive hx of passive tobacco smoke exposure  Vaping Use  . Vaping Use: Never used  Substance Use Topics  . Alcohol use: Yes    Alcohol/week: 1.0 standard drink    Types: 1 Glasses of wine per week    Comment: occasionally  . Drug use: No    Family History  Problem Relation Age of Onset  . Hyperlipidemia Mother   . Colon polyps Mother   . Nephrolithiasis Mother   . Diverticulosis Mother   .  Nephrolithiasis Father   . Depression Father        bipolar  . Colon cancer Father 74       Lynch  . Asthma Sister   . Colon cancer Paternal Aunt 47  . Colon cancer Paternal Uncle 67       x 2 uncles  . Colon polyps Sister        paternal half sister; Lynch syndrome  . Colon cancer Other        double great uncle from both mom and dads side  . Asthma Sister   . Allergies Sister   . Allergies Brother   . Clotting disorder Maternal Uncle   . Ovarian cancer Maternal Grandmother   . Colon cancer Paternal Aunt   . Diabetes Paternal Aunt   . Esophageal cancer Neg Hx   . Rectal cancer Neg Hx   . Stomach cancer Neg Hx     Allergies  Allergen Reactions  . Amoxicillin Hives  . Penicillins Hives     Has patient had a PCN reaction causing immediate rash, facial/tongue/throat swelling, SOB or lightheadedness with hypotension: No Has patient had a PCN reaction causing severe rash involving mucus membranes or skin necrosis: Yes Has patient had a PCN reaction that required hospitalization: No Has patient had a PCN reaction occurring within the last 10 years: No If all of the above answers are "NO", then may proceed with Cephalosporin use.   . Wellbutrin [Bupropion Hcl] Hives    Medication list has been reviewed and updated.  Current Outpatient Medications on File Prior to Visit  Medication Sig Dispense Refill  . bisacodyl (DULCOLAX) 5 MG EC tablet Take 5 mg by mouth daily as needed for moderate constipation.    . phentermine (ADIPEX-P) 37.5 MG tablet Take 37.5 mg by mouth daily.    . Vitamin D, Ergocalciferol, (DRISDOL) 1.25 MG (50000 UT) CAPS capsule TAKE 1 CAPSULE (50,000 UNITS TOTAL) BY MOUTH EVERY 7 (SEVEN) DAYS. 12 capsule 0   No current facility-administered medications on file prior to visit.    Review of Systems:  As per HPI- otherwise negative.   Physical Examination: There were no vitals filed for this visit. There were no vitals filed for this visit. There is no height or weight on file to calculate BMI. Ideal Body Weight:    GEN: no acute distress. HEENT: Atraumatic, Normocephalic.  Ears and Nose: No external deformity. CV: RRR, No M/G/R. No JVD. No thrill. No extra heart sounds. PULM: CTA B, no wheezes, crackles, rhonchi. No retractions. No resp. distress. No accessory muscle use. ABD: S, NT, ND, +BS. No rebound. No HSM. EXTR: No c/c/e PSYCH: Normally interactive. Conversant.    Assessment and Plan: *** This visit occurred during the SARS-CoV-2 public health emergency.  Safety protocols were in place, including screening questions prior to the visit, additional usage of staff PPE, and extensive cleaning of exam room while observing appropriate contact time as  indicated for disinfecting solutions.    Signed Lamar Blinks, MD

## 2020-05-31 NOTE — Patient Instructions (Incomplete)
Good to see you again today-  I will be in touch with your labs asap   Health Maintenance, Female Adopting a healthy lifestyle and getting preventive care are important in promoting health and wellness. Ask your health care provider about:  The right schedule for you to have regular tests and exams.  Things you can do on your own to prevent diseases and keep yourself healthy. What should I know about diet, weight, and exercise? Eat a healthy diet  Eat a diet that includes plenty of vegetables, fruits, low-fat dairy products, and lean protein.  Do not eat a lot of foods that are high in solid fats, added sugars, or sodium.   Maintain a healthy weight Body mass index (BMI) is used to identify weight problems. It estimates body fat based on height and weight. Your health care provider can help determine your BMI and help you achieve or maintain a healthy weight. Get regular exercise Get regular exercise. This is one of the most important things you can do for your health. Most adults should:  Exercise for at least 150 minutes each week. The exercise should increase your heart rate and make you sweat (moderate-intensity exercise).  Do strengthening exercises at least twice a week. This is in addition to the moderate-intensity exercise.  Spend less time sitting. Even light physical activity can be beneficial. Watch cholesterol and blood lipids Have your blood tested for lipids and cholesterol at 45 years of age, then have this test every 5 years. Have your cholesterol levels checked more often if:  Your lipid or cholesterol levels are high.  You are older than 45 years of age.  You are at high risk for heart disease. What should I know about cancer screening? Depending on your health history and family history, you may need to have cancer screening at various ages. This may include screening for:  Breast cancer.  Cervical cancer.  Colorectal cancer.  Skin cancer.  Lung  cancer. What should I know about heart disease, diabetes, and high blood pressure? Blood pressure and heart disease  High blood pressure causes heart disease and increases the risk of stroke. This is more likely to develop in people who have high blood pressure readings, are of African descent, or are overweight.  Have your blood pressure checked: ? Every 3-5 years if you are 47-64 years of age. ? Every year if you are 12 years old or older. Diabetes Have regular diabetes screenings. This checks your fasting blood sugar level. Have the screening done:  Once every three years after age 16 if you are at a normal weight and have a low risk for diabetes.  More often and at a younger age if you are overweight or have a high risk for diabetes. What should I know about preventing infection? Hepatitis B If you have a higher risk for hepatitis B, you should be screened for this virus. Talk with your health care provider to find out if you are at risk for hepatitis B infection. Hepatitis C Testing is recommended for:  Everyone born from [birth year] through 1965.  Anyone with known risk factors for hepatitis C. Sexually transmitted infections (STIs)  Get screened for STIs, including gonorrhea and chlamydia, if: ? You are sexually active and are younger than 45 years of age. ? You are older than 45 years of age and your health care provider tells you that you are at risk for this type of infection. ? Your sexual activity has changed since you  were last screened, and you are at increased risk for chlamydia or gonorrhea. Ask your health care provider if you are at risk.  Ask your health care provider about whether you are at high risk for HIV. Your health care provider may recommend a prescription medicine to help prevent HIV infection. If you choose to take medicine to prevent HIV, you should first get tested for HIV. You should then be tested every 3 months for as long as you are taking the  medicine. Pregnancy  If you are about to stop having your period (premenopausal) and you may become pregnant, seek counseling before you get pregnant.  Take 400 to 800 micrograms (mcg) of folic acid every day if you become pregnant.  Ask for birth control (contraception) if you want to prevent pregnancy. Osteoporosis and menopause Osteoporosis is a disease in which the bones lose minerals and strength with aging. This can result in bone fractures. If you are 44 years old or older, or if you are at risk for osteoporosis and fractures, ask your health care provider if you should:  Be screened for bone loss.  Take a calcium or vitamin D supplement to lower your risk of fractures.  Be given hormone replacement therapy (HRT) to treat symptoms of menopause. Follow these instructions at home: Lifestyle  Do not use any products that contain nicotine or tobacco, such as cigarettes, e-cigarettes, and chewing tobacco. If you need help quitting, ask your health care provider.  Do not use street drugs.  Do not share needles.  Ask your health care provider for help if you need support or information about quitting drugs. Alcohol use  Do not drink alcohol if: ? Your health care provider tells you not to drink. ? You are pregnant, may be pregnant, or are planning to become pregnant.  If you drink alcohol: ? Limit how much you use to 0-1 drink a day. ? Limit intake if you are breastfeeding.  Be aware of how much alcohol is in your drink. In the [place] one drink equals one 12 oz bottle of beer (355 mL), one 5 oz glass of wine (148 mL), or one 1 oz glass of hard liquor (44 mL). General instructions  Schedule regular health, dental, and eye exams.  Stay current with your vaccines.  Tell your health care provider if: ? You often feel depressed. ? You have ever been abused or do not feel safe at home. Summary  Adopting a healthy lifestyle and getting preventive care are important in  promoting health and wellness.  Follow your health care provider's instructions about healthy diet, exercising, and getting tested or screened for diseases.  Follow your health care provider's instructions on monitoring your cholesterol and blood pressure. This information is not intended to replace advice given to you by your health care provider. Make sure you discuss any questions you have with your health care provider. Document Revised: 04/30/2018 Document Reviewed: 04/30/2018 Elsevier Patient Education  2021 [employer].

## 2020-06-02 ENCOUNTER — Encounter: Payer: BC Managed Care – PPO | Admitting: Family Medicine

## 2020-06-02 DIAGNOSIS — Z1159 Encounter for screening for other viral diseases: Secondary | ICD-10-CM

## 2020-06-02 DIAGNOSIS — Z0000 Encounter for general adult medical examination without abnormal findings: Secondary | ICD-10-CM

## 2020-06-02 DIAGNOSIS — Z13220 Encounter for screening for lipoid disorders: Secondary | ICD-10-CM

## 2020-06-02 DIAGNOSIS — Z130 Encounter for screening for diseases of the blood and blood-forming organs and certain disorders involving the immune mechanism: Secondary | ICD-10-CM

## 2020-06-02 DIAGNOSIS — R5383 Other fatigue: Secondary | ICD-10-CM

## 2020-06-02 DIAGNOSIS — Z131 Encounter for screening for diabetes mellitus: Secondary | ICD-10-CM

## 2020-09-01 ENCOUNTER — Other Ambulatory Visit: Payer: Self-pay | Admitting: Family Medicine

## 2020-09-01 DIAGNOSIS — Z1231 Encounter for screening mammogram for malignant neoplasm of breast: Secondary | ICD-10-CM

## 2020-09-05 ENCOUNTER — Ambulatory Visit: Payer: BC Managed Care – PPO

## 2020-10-25 ENCOUNTER — Other Ambulatory Visit: Payer: Self-pay

## 2020-10-25 ENCOUNTER — Ambulatory Visit
Admission: RE | Admit: 2020-10-25 | Discharge: 2020-10-25 | Disposition: A | Discharge status: Home / Self Care | Payer: 59 | Source: Ambulatory Visit | Attending: Family Medicine | Admitting: Family Medicine

## 2020-10-25 DIAGNOSIS — Z1231 Encounter for screening mammogram for malignant neoplasm of breast: Secondary | ICD-10-CM

## 2020-10-29 NOTE — Progress Notes (Deleted)
[facility] [address] [place], [place] [address] [phone]  Date:  11/07/2020   Name:  Susan Roth   DOB:  1975/07/27   MRN:  [id number]  PCP:  Darreld Mclean, MD    Chief Complaint: No chief complaint on file.   History of Present Illness:  Susan Roth is a 45 y.o. very pleasant female patient who presents with the following:  Patient seen today for physical exam Most recent visit with myself was in 2019, only other visit together  History of vitamin D deficiency, dysmenorrhea, allergies Most recent blood work on chart from February 2020  COVID-19 series Can do hepatitis C screening, other routine blood work It looks like tetanus is due High risk for colon cancer due to family history.  Most recent colonoscopy June 2020, it appears she is due for repeat Mammogram is up-to-date Pap was done in 2020 by GYN-?  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   Right flank pain 09/07/2015   Scalp irritation 07/01/2015   Low back pain 12/06/2014   RUQ discomfort 07/20/2014   Sinusitis, acute 04/06/2014   Paresthesia 02/09/2013   Urethritis 12/30/2012   IBS (irritable bowel syndrome) 11/13/2012   Admission for sterilization 08/04/2012   Dysmenorrhea 08/04/2012   Obesity 06/25/2012   Urticaria, 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   Anxiety and depression 09/26/2010   ACNE, MILD 09/27/2008   HERPES ZOSTER 09/17/2008   Allergic rhinitis due to other allergen 09/17/2008    Past Medical History:  Diagnosis Date   Abnormal Pap smear of cervix    cryo   Allergic rhinitis    Allergy    Constipation    is every day- pt off Linzess    Dysfunction of eustachian tube    Endometriosis    GERD (gastroesophageal  reflux disease)    occasional   Herpes zoster without mention of complication    History of kidney stones    IBS (irritable bowel syndrome)    Neuropathy    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    Obesity     Past Surgical History:  Procedure Laterality Date   BILATERAL SALPINGECTOMY Right 08/04/2012   Procedure: BILATERAL SALPINGECTOMY;  Surgeon: Emily Filbert, MD;  Location: [facility];  Service: Gynecology;  Laterality: Right;   CESAREAN SECTION  1998   x1   CHOLECYSTECTOMY N/A 01/30/2018   Procedure: LAPAROSCOPIC CHOLECYSTECTOMY;  Surgeon: Greer Pickerel, MD;  Location: [facility];  Service: General;  Laterality: N/A;   COLONOSCOPY     LAPAROSCOPIC TUBAL LIGATION N/A 08/04/2012   Procedure: LAPAROSCOPIC TUBAL LIGATION;  Surgeon: Emily Filbert, MD;  Location: [facility];  Service: Gynecology;  Laterality: N/A;  Operative lsc for right salpingectomy & Mirena insertion- pt had a  bilat salphingectomy NOT tubal    TONSILLECTOMY     as an adult - 2011   WISDOM TOOTH EXTRACTION      Social History   Tobacco Use   Smoking status: Never   Smokeless tobacco: Never   Tobacco comments:    positive hx of passive tobacco smoke exposure  Vaping Use   Vaping Use: Never used  Substance Use Topics   Alcohol use: Yes    Alcohol/week: 1.0  standard drink    Types: 1 Glasses of wine per week    Comment: occasionally   Drug use: No    Family History  Problem Relation Age of Onset   Hyperlipidemia Mother    Colon polyps Mother    Nephrolithiasis Mother    Diverticulosis Mother    Nephrolithiasis Father    Depression Father        bipolar   Colon cancer Father 79       Lynch   Asthma Sister    Colon cancer Paternal Aunt 76   Colon cancer Paternal Uncle 42       x 2 uncles   Colon polyps Sister        paternal half sister; Lynch syndrome   Colon cancer Other        double great uncle from both mom and dads side   Asthma Sister    Allergies Sister    Allergies Brother    Clotting  disorder Maternal Uncle    Ovarian cancer Maternal Grandmother    Colon cancer Paternal Aunt    Diabetes Paternal Aunt    Esophageal cancer Neg Hx    Rectal cancer Neg Hx    Stomach cancer Neg Hx     Allergies  Allergen Reactions   Amoxicillin Hives   Penicillins Hives    Has patient had a PCN reaction causing immediate rash, facial/tongue/throat swelling, SOB or lightheadedness with hypotension: No Has patient had a PCN reaction causing severe rash involving mucus membranes or skin necrosis: Yes Has patient had a PCN reaction that required hospitalization: No Has patient had a PCN reaction occurring within the last 10 years: No If all of the above answers are "NO", then may proceed with Cephalosporin use.    Wellbutrin [Bupropion Hcl] Hives    Medication list has been reviewed and updated.  Current Outpatient Medications on File Prior to Visit  Medication Sig Dispense Refill   bisacodyl (DULCOLAX) 5 MG EC tablet Take 5 mg by mouth daily as needed for moderate constipation.     phentermine (ADIPEX-P) 37.5 MG tablet Take 37.5 mg by mouth daily.     Vitamin D, Ergocalciferol, (DRISDOL) 1.25 MG (50000 UT) CAPS capsule TAKE 1 CAPSULE (50,000 UNITS TOTAL) BY MOUTH EVERY 7 (SEVEN) DAYS. 12 capsule 0   No current facility-administered medications on file prior to visit.    Review of Systems:  As per HPI- otherwise negative.   Physical Examination: There were no vitals filed for this visit. There were no vitals filed for this visit. There is no height or weight on file to calculate BMI. Ideal Body Weight:    GEN: no acute distress. HEENT: Atraumatic, Normocephalic.  Ears and Nose: No external deformity. CV: RRR, No M/G/R. No JVD. No thrill. No extra heart sounds. PULM: CTA B, no wheezes, crackles, rhonchi. No retractions. No resp. distress. No accessory muscle use. ABD: S, NT, ND, +BS. No rebound. No HSM. EXTR: No c/c/e PSYCH: Normally interactive. Conversant.     Assessment and Plan:  Here today for physical exam.  Encouraged healthy diet and exercise routine This visit occurred during the SARS-CoV-2 public health emergency.  Safety protocols were in place, including screening questions prior to the visit, additional usage of staff PPE, and extensive cleaning of exam room while observing appropriate contact time as indicated for disinfecting solutions.   Signed Lamar Blinks, MD

## 2020-11-01 ENCOUNTER — Encounter: Payer: Self-pay | Admitting: Family

## 2020-11-01 ENCOUNTER — Ambulatory Visit (INDEPENDENT_AMBULATORY_CARE_PROVIDER_SITE_OTHER): Payer: 59 | Admitting: Family

## 2020-11-01 ENCOUNTER — Other Ambulatory Visit: Payer: Self-pay

## 2020-11-01 VITALS — BP 110/80 | HR 95 | Temp 98.5°F | Ht 64.0 in | Wt 212.6 lb

## 2020-11-01 DIAGNOSIS — J019 Acute sinusitis, unspecified: Secondary | ICD-10-CM

## 2020-11-01 MED ORDER — FLUTICASONE PROPIONATE 50 MCG/ACT NA SUSP: 2.0000 | Freq: Every day | NASAL | 6 refills | Status: DC

## 2020-11-01 MED ORDER — BENZONATATE 100 MG PO CAPS: 100.0000 mg | ORAL_CAPSULE | Freq: Three times a day (TID) | ORAL | 0 refills | Status: DC | PRN

## 2020-11-01 MED ORDER — DOXYCYCLINE HYCLATE 100 MG PO TABS: 100.0000 mg | ORAL_TABLET | Freq: Two times a day (BID) | ORAL | 0 refills | Status: DC

## 2020-11-01 NOTE — Progress Notes (Signed)
Susan Roth is a 45 y.o. 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   Right flank pain 09/07/2015   Scalp irritation 07/01/2015   Low back pain 12/06/2014   RUQ discomfort 07/20/2014   Sinusitis, acute 04/06/2014   Paresthesia 02/09/2013   Urethritis 12/30/2012   IBS (irritable bowel syndrome) 11/13/2012   Admission for sterilization 08/04/2012   Dysmenorrhea 08/04/2012   Obesity 06/25/2012   Urticaria, 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   Anxiety and depression 09/26/2010   ACNE, MILD 09/27/2008   HERPES ZOSTER 09/17/2008   Allergic rhinitis due to other allergen 09/17/2008    Current Outpatient Medications  Medication Sig Dispense Refill   benzonatate (TESSALON) 100 MG capsule Take 1 capsule (100 mg total) by mouth 3 (three) times daily as needed. 20 capsule 0   doxycycline (VIBRA-TABS) 100 MG tablet Take 1 tablet (100 mg total) by mouth 2 (two) times daily. 20 tablet 0   fluticasone (FLONASE) 50 MCG/ACT nasal spray Place 2 sprays into both nostrils daily. 16 g 6   guaiFENesin (MUCINEX) 600 MG 12 hr tablet Take by mouth 2 (two) times daily.     No current facility-administered medications for this visit.    Allergies: Amoxicillin, Penicillins, and Wellbutrin [bupropion hcl]  Past Medical History:  Diagnosis Date   Abnormal Pap smear of cervix    cryo   Allergic rhinitis    Allergy    Constipation    is every day- pt off Linzess    Dysfunction of eustachian tube    Endometriosis    GERD (gastroesophageal reflux disease)    occasional   Herpes zoster without mention of complication    History of kidney stones    IBS (irritable bowel syndrome)    Neuropathy    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    Obesity     Past Surgical History:  Procedure  Laterality Date   BILATERAL SALPINGECTOMY Right 08/04/2012   Procedure: BILATERAL SALPINGECTOMY;  Surgeon: Emily Filbert, MD;  Location: [facility];  Service: Gynecology;  Laterality: Right;   CESAREAN SECTION  1998   x1   CHOLECYSTECTOMY N/A 01/30/2018   Procedure: LAPAROSCOPIC CHOLECYSTECTOMY;  Surgeon: Greer Pickerel, MD;  Location: [facility];  Service: General;  Laterality: N/A;   COLONOSCOPY     LAPAROSCOPIC TUBAL LIGATION N/A 08/04/2012   Procedure: LAPAROSCOPIC TUBAL LIGATION;  Surgeon: Emily Filbert, MD;  Location: [facility];  Service: Gynecology;  Laterality: N/A;  Operative lsc for right salpingectomy & Mirena insertion- pt had a  bilat salphingectomy NOT tubal    TONSILLECTOMY     as an adult - 2011   WISDOM TOOTH EXTRACTION      Family History  Problem Relation Age of Onset   Hyperlipidemia Mother    Colon polyps Mother    Nephrolithiasis Mother    Diverticulosis Mother    Nephrolithiasis Father    Depression Father        bipolar   Colon cancer Father 31       Lynch   Asthma Sister    Colon cancer Paternal Aunt 66   Colon cancer Paternal Uncle 48       x 2 uncles   Colon polyps Sister        paternal half sister;  Lynch syndrome   Colon cancer Other        double great uncle from both mom and dads side   Asthma Sister    Allergies Sister    Allergies Brother    Clotting disorder Maternal Uncle    Ovarian cancer Maternal Grandmother    Colon cancer Paternal Aunt    Diabetes Paternal Aunt    Esophageal cancer Neg Hx    Rectal cancer Neg Hx    Stomach cancer Neg Hx     Social History   Tobacco Use   Smoking status: Never   Smokeless tobacco: Never   Tobacco comments:    positive hx of passive tobacco smoke exposure  Substance Use Topics   Alcohol use: Yes    Alcohol/week: 1.0 standard drink    Types: 1 Glasses of wine per week    Comment: occasionally    Subjective:  Concerned for possible sinus infection; started last Friday with cough; also experiencing headache/  pressure behind eyes; Negative COVID test;  Cough more problematic at night; using OTC Mucinex; does have seasonal allergies;   LMP 10/31/20   Objective:  Vitals:   11/01/20 1106  BP: 110/80  Pulse: 95  Temp: 98.5 F (36.9 C)  TempSrc: Oral  SpO2: 99%  Weight: 212 lb 9.6 oz (96.4 kg)  Height: 5\' 4"  (1.626 m)    General: Well developed, well nourished, in no acute distress  Skin : Warm and dry.  Head: Normocephalic and atraumatic  Eyes: Sclera and conjunctiva clear; pupils round and reactive to light; extraocular movements intact  Ears: External normal; canals clear; tympanic membranes normal  Oropharynx: Pink, supple. No suspicious lesions  Neck: Supple without thyromegaly, adenopathy  Lungs: Respirations unlabored; clear to auscultation bilaterally without wheeze, rales, rhonchi  CVS exam: normal rate and regular rhythm.  Neurologic: Alert and oriented; speech intact; face symmetrical; moves all extremities well; CNII-XII intact without focal deficit   Assessment:  1. Acute sinusitis, recurrence not specified, unspecified location     Plan:  Rx for Doxycycline 100 mg bid x 10 days, Flonase and Tessalon Perles; increase fluids, rest and follow up worse, no better; Keep planned appt for CPE next week with her PCP;  This visit occurred during the SARS-CoV-2 public health emergency.  Safety protocols were in place, including screening questions prior to the visit, additional usage of staff PPE, and extensive cleaning of exam room while observing appropriate contact time as indicated for disinfecting solutions.    No follow-ups on file.  No orders of the defined types were placed in this encounter.   Requested Prescriptions   Signed Prescriptions Disp Refills   doxycycline (VIBRA-TABS) 100 MG tablet 20 tablet 0    Sig: Take 1 tablet (100 mg total) by mouth 2 (two) times daily.   fluticasone (FLONASE) 50 MCG/ACT nasal spray 16 g 6    Sig: Place 2 sprays into both nostrils  daily.   benzonatate (TESSALON) 100 MG capsule 20 capsule 0    Sig: Take 1 capsule (100 mg total) by mouth 3 (three) times daily as needed.

## 2020-11-04 ENCOUNTER — Telehealth: Payer: Self-pay | Admitting: Family Medicine

## 2020-11-04 NOTE — Telephone Encounter (Signed)
pt states after taking the antibiotic she has  develop an yeast infection pt,would like a prescription for a yeast infection   Please advice

## 2020-11-07 ENCOUNTER — Other Ambulatory Visit: Payer: Self-pay | Admitting: Family

## 2020-11-07 ENCOUNTER — Encounter: Payer: BC Managed Care – PPO | Admitting: Family Medicine

## 2020-11-07 MED ORDER — FLUCONAZOLE 150 MG PO TABS: ORAL_TABLET | ORAL | 0 refills | Status: DC

## 2020-11-07 NOTE — Telephone Encounter (Signed)
Patient was given doxy on 11/01/2020. She is requesting diflucan to be sent in for yeast infection developed after antibiotic.

## 2020-11-26 NOTE — Progress Notes (Deleted)
[place] [facility] [address], [place], [place] [address] [phone]  Date:  12/02/2020   Name:  Susan Roth   DOB:  Oct 17, 1975   MRN:  [id number]  PCP:  Darreld Mclean, MD    Chief Complaint: No chief complaint on file.   History of Present Illness:  Susan Roth is a 45 y.o. very pleasant female patient who presents with the following:  Pt seen today for a CPE Last visit with myself in 2019- at that time we established care and discussed an issue with her GB She has an adult daughter, live in [place]  GYN per Dr Hulan Fray She was seen by Mickel Baas last month with sinusitis   Covid series Hep C screening can be done Colon?  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   Right flank pain 09/07/2015   Scalp irritation 07/01/2015   Low back pain 12/06/2014   RUQ discomfort 07/20/2014   Sinusitis, acute 04/06/2014   Paresthesia 02/09/2013   Urethritis 12/30/2012   IBS (irritable bowel syndrome) 11/13/2012   Admission for sterilization 08/04/2012   Dysmenorrhea 08/04/2012   Obesity 06/25/2012   Urticaria, 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   Anxiety and depression 09/26/2010   ACNE, MILD 09/27/2008   HERPES ZOSTER 09/17/2008   Allergic rhinitis due to other allergen 09/17/2008    Past Medical History:  Diagnosis Date   Abnormal Pap smear of cervix    cryo   Allergic rhinitis    Allergy    Constipation    is every day- pt off Linzess    Dysfunction of eustachian tube    Endometriosis    GERD (gastroesophageal reflux disease)    occasional   Herpes zoster without mention of complication    History of kidney stones    IBS (irritable bowel syndrome)    Neuropathy    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    Obesity     Past Surgical History:  Procedure Laterality Date   BILATERAL SALPINGECTOMY Right 08/04/2012   Procedure: BILATERAL SALPINGECTOMY;  Surgeon: Emily Filbert, MD;  Location: [facility];  Service: Gynecology;  Laterality: Right;   CESAREAN SECTION  1998   x1   CHOLECYSTECTOMY N/A 01/30/2018   Procedure: LAPAROSCOPIC CHOLECYSTECTOMY;  Surgeon: Greer Pickerel, MD;  Location: [facility];  Service: General;  Laterality: N/A;   COLONOSCOPY     LAPAROSCOPIC TUBAL LIGATION N/A 08/04/2012   Procedure: LAPAROSCOPIC TUBAL LIGATION;  Surgeon: Emily Filbert, MD;  Location: [facility];  Service: Gynecology;  Laterality: N/A;  Operative lsc for right salpingectomy & Mirena insertion- pt had a  bilat salphingectomy NOT tubal    TONSILLECTOMY     as an adult - 2011   WISDOM TOOTH EXTRACTION      Social History   Tobacco Use   Smoking status: Never   Smokeless tobacco: Never   Tobacco comments:    positive hx of passive tobacco smoke exposure  Vaping Use   Vaping Use: Never used  Substance Use Topics   Alcohol use: Yes    Alcohol/week: 1.0 standard drink    Types: 1 Glasses of wine per week    Comment: occasionally  Drug use: No    Family History  Problem Relation Age of Onset   Hyperlipidemia Mother    Colon polyps Mother    Nephrolithiasis Mother    Diverticulosis Mother    Nephrolithiasis Father    Depression Father        bipolar   Colon cancer Father 87       Lynch   Asthma Sister    Colon cancer Paternal Aunt 109   Colon cancer Paternal Uncle 12       x 2 uncles   Colon polyps Sister        paternal half sister; Lynch syndrome   Colon cancer Other        double great uncle from both mom and dads side   Asthma Sister    Allergies Sister    Allergies Brother    Clotting disorder Maternal Uncle    Ovarian cancer Maternal Grandmother    Colon cancer Paternal Aunt    Diabetes Paternal Aunt    Esophageal cancer Neg Hx    Rectal cancer Neg Hx    Stomach  cancer Neg Hx     Allergies  Allergen Reactions   Amoxicillin Hives   Penicillins Hives    Has patient had a PCN reaction causing immediate rash, facial/tongue/throat swelling, SOB or lightheadedness with hypotension: No Has patient had a PCN reaction causing severe rash involving mucus membranes or skin necrosis: Yes Has patient had a PCN reaction that required hospitalization: No Has patient had a PCN reaction occurring within the last 10 years: No If all of the above answers are "NO", then may proceed with Cephalosporin use.    Wellbutrin [Bupropion Hcl] Hives    Medication list has been reviewed and updated.  Current Outpatient Medications on File Prior to Visit  Medication Sig Dispense Refill   benzonatate (TESSALON) 100 MG capsule Take 1 capsule (100 mg total) by mouth 3 (three) times daily as needed. 20 capsule 0   doxycycline (VIBRA-TABS) 100 MG tablet Take 1 tablet (100 mg total) by mouth 2 (two) times daily. 20 tablet 0   fluconazole (DIFLUCAN) 150 MG tablet Take 1 tablet today as directed; repeat after 72 hours 2 tablet 0   fluticasone (FLONASE) 50 MCG/ACT nasal spray Place 2 sprays into both nostrils daily. 16 g 6   guaiFENesin (MUCINEX) 600 MG 12 hr tablet Take by mouth 2 (two) times daily.     No current facility-administered medications on file prior to visit.    Review of Systems:  As per HPI- otherwise negative.   Physical Examination: There were no vitals filed for this visit. There were no vitals filed for this visit. There is no height or weight on file to calculate BMI. Ideal Body Weight:    GEN: no acute distress. HEENT: Atraumatic, Normocephalic.  Ears and Nose: No external deformity. CV: RRR, No M/G/R. No JVD. No thrill. No extra heart sounds. PULM: CTA B, no wheezes, crackles, rhonchi. No retractions. No resp. distress. No accessory muscle use. ABD: S, NT, ND, +BS. No rebound. No HSM. EXTR: No c/c/e PSYCH: Normally interactive. Conversant.     Assessment and Plan: *** Physical exam today Encouraged healthy diet and exercise routine Will plan further follow- up pending labs.  This visit occurred during the SARS-CoV-2 public health emergency.  Safety protocols were in place, including screening questions prior to the visit, additional usage of staff PPE, and extensive cleaning of exam room while observing appropriate contact time as indicated for  disinfecting solutions.   Signed Lamar Blinks, MD

## 2020-12-02 ENCOUNTER — Encounter: Payer: 59 | Admitting: Family Medicine

## 2021-01-04 ENCOUNTER — Encounter: Payer: Self-pay | Admitting: Internal Medicine

## 2021-01-04 ENCOUNTER — Other Ambulatory Visit: Payer: Self-pay

## 2021-01-04 ENCOUNTER — Encounter: Payer: Self-pay | Admitting: Family Medicine

## 2021-01-04 ENCOUNTER — Ambulatory Visit: Payer: 59 | Admitting: Internal Medicine

## 2021-01-04 VITALS — BP 126/84 | HR 80 | Temp 98.2°F | Resp 16 | Ht 64.0 in | Wt 219.1 lb

## 2021-01-04 DIAGNOSIS — M25512 Pain in left shoulder: Secondary | ICD-10-CM

## 2021-01-04 DIAGNOSIS — E669 Obesity, unspecified: Secondary | ICD-10-CM

## 2021-01-04 NOTE — Progress Notes (Signed)
Subjective:    Patient ID: Susan Roth, female    DOB: 1975-12-25, 45 y.o.   MRN: [id number]  DOS:  01/04/2021 Type of visit - description: Acute Has 2 concerns  5 to 6 weeks ago he was reaching forward w/ her  left arm and felt pain @ the shoulder. Continue with pain since then. Pain is triggered by reaching forward or trying to scratch her back with the left arm.  Also reports lower extremity edema bilaterally and some discomfort at  legs at bedtime. States: "My legs were always big but they are getting bigger".  On chart review, she has gained 15 pounds in less than 2 years, he agrees that that has been the case.  Denies chest pain or difficulty breathing. No claudication No palpitations No nausea or vomiting   Wt Readings from Last 3 Encounters:  01/04/21 219 lb 1 oz (99.4 kg)  11/01/20 212 lb 9.6 oz (96.4 kg)  02/25/19 206 lb 3.2 oz (93.5 kg)     Review of Systems See above   Past Medical History:  Diagnosis Date   Abnormal Pap smear of cervix    cryo   Allergic rhinitis    Allergy    Constipation    is every day- pt off Linzess    Dysfunction of eustachian tube    Endometriosis    GERD (gastroesophageal reflux disease)    occasional   Herpes zoster without mention of complication    History of kidney stones    IBS (irritable bowel syndrome)    Neuropathy    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    Obesity     Past Surgical History:  Procedure Laterality Date   BILATERAL SALPINGECTOMY Right 08/04/2012   Procedure: BILATERAL SALPINGECTOMY;  Surgeon: Emily Filbert, MD;  Location: [facility];  Service: Gynecology;  Laterality: Right;   CESAREAN SECTION  1998   x1   CHOLECYSTECTOMY N/A 01/30/2018   Procedure: LAPAROSCOPIC CHOLECYSTECTOMY;  Surgeon: Greer Pickerel, MD;  Location: [facility];  Service: General;  Laterality: N/A;   COLONOSCOPY     LAPAROSCOPIC TUBAL LIGATION N/A 08/04/2012   Procedure: LAPAROSCOPIC TUBAL LIGATION;  Surgeon: Emily Filbert, MD;   Location: [facility];  Service: Gynecology;  Laterality: N/A;  Operative lsc for right salpingectomy & Mirena insertion- pt had a  bilat salphingectomy NOT tubal    TONSILLECTOMY     as an adult - 2011   WISDOM TOOTH EXTRACTION      Allergies as of 01/04/2021       Reactions   Amoxicillin Hives   Penicillins Hives   Has patient had a PCN reaction causing immediate rash, facial/tongue/throat swelling, SOB or lightheadedness with hypotension: No Has patient had a PCN reaction causing severe rash involving mucus membranes or skin necrosis: Yes Has patient had a PCN reaction that required hospitalization: No Has patient had a PCN reaction occurring within the last 10 years: No If all of the above answers are "NO", then may proceed with Cephalosporin use.   Wellbutrin [bupropion Hcl] Hives        Medication List        Accurate as of January 04, 2021  4:04 PM. If you have any questions, ask your nurse or doctor.          benzonatate 100 MG capsule Commonly known as: TESSALON Take 1 capsule (100 mg total) by mouth 3 (three) times daily as needed.   doxycycline 100 MG tablet Commonly  known as: VIBRA-TABS Take 1 tablet (100 mg total) by mouth 2 (two) times daily.   fluconazole 150 MG tablet Commonly known as: DIFLUCAN Take 1 tablet today as directed; repeat after 72 hours   fluticasone 50 MCG/ACT nasal spray Commonly known as: FLONASE Place 2 sprays into both nostrils daily.   guaiFENesin 600 MG 12 hr tablet Commonly known as: MUCINEX Take by mouth 2 (two) times daily.           Objective:   Physical Exam BP 126/84 (BP Location: Left Arm, Patient Position: Sitting, Cuff Size: Normal)   Pulse 80   Temp 98.2 F (36.8 C) (Oral)   Resp 16   Ht '5\' 4"'$  (1.626 m)   Wt 219 lb 1 oz (99.4 kg)   LMP 12/26/2020 (Approximate)   SpO2 98%   BMI 37.60 kg/m  General:   Well developed, NAD, BMI noted. HEENT:  Normocephalic . Face symmetric, atraumatic Lungs:  CTA B Normal  respiratory effort, no intercostal retractions, no accessory muscle use. Heart: RRR,  no murmur.  Lower extremities: no pretibial edema bilaterally.  Skin is not red, not warm.  Slightly TTP throughout the pretibial area.  Good pedal pulses bilaterally. MSK: Right shoulder normal Left shoulder: Range of motion slightly decreased d/t pain, no deformities Neurologic:  alert & oriented X3.  Speech normal, gait appropriate for age and unassisted Psych--  Cognition and judgment appear intact.  Cooperative with normal attention span and concentration.  Behavior appropriate. No anxious or depressed appearing.      Assessment   45 year old female, PMH includes obesity with a BMI of 37, on no meds, history of BTL, presents with:  Obesity: Patient reports lower extremity edema, objectively no edema, good pedal pulses.  We had a long discussion about the need to lose weight, recommended organized effort such as weight watchers or Noom.  Also a gradual exercise program. Left shoulder pain: Probably a internal derangement, refer to sports medicine. Rec to see PCP in 1 month for follow-up of above. Also needs CPX     This visit occurred during the SARS-CoV-2 public health emergency.  Safety protocols were in place, including screening questions prior to the visit, additional usage of staff PPE, and extensive cleaning of exam room while observing appropriate contact time as indicated for disinfecting solutions.

## 2021-01-04 NOTE — Patient Instructions (Signed)
We are going to refer you to the sports medicine for your left shoulder pain   try to eat healthier.  Weight watchers? NOOM?  Watch your salt intake  Please schedule a physical exam with Dr. Lorelei Pont

## 2021-01-11 NOTE — Progress Notes (Signed)
Subjective:    I'm seeing this patient as a consultation for:  Dr. Larose Kells and Dr. Lorelei Pont. Note will be routed back to referring provider/PCP.  CC: L shoulder pain  I, Susan Roth, LAT, ATC, am serving as scribe for Dr. Lynne Roth.  HPI: Pt is a 45 y/o female presenting w/ L shoulder pain x approximately 6-8 weeks after reaching into the dryer to get clothes out. Pt reports she jerked her arm back because she was startled.  She locates her pain to deep within Susan Roth joint.  Radiating pain: yes- distally to L elbow L shoulder mechanical symptoms: no Aggravating factors: overhead Treatments tried: none  Past medical history, Surgical history, Family history, Social history, Allergies, and medications have been entered into the medical record, reviewed.   Review of Systems: No new headache, visual changes, nausea, vomiting, diarrhea, constipation, dizziness, abdominal pain, skin rash, fevers, chills, night sweats, weight loss, swollen lymph nodes, body aches, joint swelling, muscle aches, chest pain, shortness of breath, mood changes, visual or auditory hallucinations.   Objective:    Vitals:   01/12/21 0813  BP: 118/78  Pulse: 91  SpO2: 98%   General: Well Developed, well nourished, and in no acute distress.  Neuro/Psych: Alert and oriented x3, extra-ocular muscles intact, able to move all 4 extremities, sensation grossly intact. Skin: Warm and dry, no rashes noted.  Respiratory: Not using accessory muscles, speaking in full sentences, trachea midline.  Cardiovascular: Pulses palpable, no extremity edema. Abdomen: Does not appear distended. MSK:  C-spine normal-appearing Nontender midline. Normal cervical motion. Upper extremity strength intact except noted below.  Left shoulder normal-appearing Tender palpation anterior shoulder. Range of motion decreased abduction to 130 degrees. External rotation 30 degrees by neutral position Internal rotation to lumbar spine. Strength 4/5  abduction and internal rotation 5/5 external rotation. Positive Hawkins and Neer's test positive empty can test mildly positive Yergason's and speeds test Positive crossover arm compression test.   Lab and Radiology Results  Diagnostic Limited MSK Ultrasound of: Left shoulder Biceps tendon intact normal-appearing Subscapularis tendon is intact. Supraspinatus tendon is intact. Mild subacromial bursitis present. Infraspinatus tendon is intact. AC joint is intact and normal-appearing Impression: Subacromial bursitis   X-ray images left shoulder obtained today personally and independently interpreted No acute fractures.  No severe DJD. Await formal radiology review  Impression and Recommendations:    Assessment and Plan: 45 y.o. female with left shoulder pain predominantly due to subacromial impingement and bursitis.  May have a somewhat occult rotator cuff injury although this is difficult to characterize.  Also may have a labrum injury as well.  She is a great candidate for physical therapy.  Plan for referral to PT and recheck in about 6 weeks.  If not improved would consider MRI arthrogram..  PDMP not reviewed this encounter. Orders Placed This Encounter  Procedures   [place] LIMITED JOINT SPACE STRUCTURES UP LEFT(NO LINKED CHARGES)    Standing Status:   Future    Number of Occurrences:   1    Standing Expiration Date:   07/15/2021    Order Specific Question:   Reason for Exam (SYMPTOM  OR DIAGNOSIS REQUIRED)    Answer:   left shoulder pain    Order Specific Question:   Preferred imaging location?    Answer:   [facility]   DG Shoulder Left    Standing Status:   Future    Number of Occurrences:   1    Standing Expiration Date:  01/12/2022    Order Specific Question:   Reason for Exam (SYMPTOM  OR DIAGNOSIS REQUIRED)    Answer:   left shoulder pain    Order Specific Question:   Preferred imaging location?    Answer:   [facility]    Order  Specific Question:   Is patient pregnant?    Answer:   No   Ambulatory referral to Physical Therapy    Referral Priority:   Routine    Referral Type:   Physical Medicine    Referral Reason:   Specialty Services Required    Requested Specialty:   Physical Therapy    Number of Visits Requested:   1   No orders of the defined types were placed in this encounter.   Discussed warning signs or symptoms. Please see discharge instructions. Patient expresses understanding.   The above documentation has been reviewed and is accurate and complete Susan Roth, M.D.

## 2021-01-12 ENCOUNTER — Ambulatory Visit: Payer: Self-pay

## 2021-01-12 ENCOUNTER — Other Ambulatory Visit: Payer: Self-pay

## 2021-01-12 ENCOUNTER — Ambulatory Visit: Payer: 59 | Admitting: Family Medicine

## 2021-01-12 ENCOUNTER — Ambulatory Visit (INDEPENDENT_AMBULATORY_CARE_PROVIDER_SITE_OTHER): Payer: 59

## 2021-01-12 VITALS — BP 118/78 | HR 91 | Ht 64.0 in | Wt 215.2 lb

## 2021-01-12 DIAGNOSIS — M25512 Pain in left shoulder: Secondary | ICD-10-CM

## 2021-01-12 DIAGNOSIS — G8929 Other chronic pain: Secondary | ICD-10-CM

## 2021-01-12 NOTE — Patient Instructions (Signed)
Thank you for coming in today.   Please get an Xray today before you leave   I've referred you to Physical Therapy.  Let us know if you don't hear from them in one week.   Recheck in 6 weeks.   Let me know if this is not working.   I can do an injection etc.

## 2021-01-13 NOTE — Progress Notes (Signed)
Left shoulder x-ray shows no fracture or dislocation of the shoulder.  No severe arthritis present.

## 2021-01-28 NOTE — Progress Notes (Addendum)
[facility] [place], [place], [place] [address] [phone]  Date:  02/06/2021   Name:  Susan Roth   DOB:  26-Jun-1975   MRN:  [id number]  PCP:  Darreld Mclean, MD    Chief Complaint: Annual Exam (Concerns/ questions: none /Flu shot: yes/Tdap: none in ncir, pt thinks she has had on in the last 10 years)   History of Present Illness:  Susan Roth is a 45 y.o. very pleasant female patient who presents with the following:  Pt seen today for a CPE Last seen by myself in 2019- our only visit so far  She has gained some weight since out last visit  Family history of lynch syndrome   She has a live in BF and an adult daughter -she is 49 yo and doing well, she got married  She works at [employer]; Dr Hulan Fray, they do her pap and mammogram   Tetanus- she declines today  Covid booster Colonoscopy- 2 years ago per Dr Carlean Purl, he asked for a one year follow-up due to fhx. 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   Low back pain 12/06/2014   Paresthesia 02/09/2013   IBS (irritable bowel syndrome) 11/13/2012   Admission for sterilization 08/04/2012   Dysmenorrhea 08/04/2012   Obesity 06/25/2012   Urticaria, 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   Anxiety and depression 09/26/2010   ACNE, MILD 09/27/2008   HERPES ZOSTER 09/17/2008    Past Medical History:  Diagnosis Date   Abnormal Pap smear of cervix    cryo   Allergic rhinitis    Allergy    Constipation    is every day- pt off Linzess    Dysfunction of eustachian tube    Endometriosis    GERD (gastroesophageal reflux  disease)    occasional   Herpes zoster without mention of complication    History of kidney stones    IBS (irritable bowel syndrome)    Neuropathy    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    Obesity     Past Surgical History:  Procedure Laterality Date   BILATERAL SALPINGECTOMY Right 08/04/2012   Procedure: BILATERAL SALPINGECTOMY;  Surgeon: Emily Filbert, MD;  Location: [facility];  Service: Gynecology;  Laterality: Right;   CESAREAN SECTION  1998   x1   CHOLECYSTECTOMY N/A 01/30/2018   Procedure: LAPAROSCOPIC CHOLECYSTECTOMY;  Surgeon: Greer Pickerel, MD;  Location: [facility];  Service: General;  Laterality: N/A;   COLONOSCOPY     LAPAROSCOPIC TUBAL LIGATION N/A 08/04/2012   Procedure: LAPAROSCOPIC TUBAL LIGATION;  Surgeon: Emily Filbert, MD;  Location: [facility];  Service: Gynecology;  Laterality: N/A;  Operative lsc for right salpingectomy & Mirena insertion- pt had a  bilat salphingectomy NOT tubal    TONSILLECTOMY     as an adult - 2011   WISDOM TOOTH EXTRACTION      Social History   Tobacco Use   Smoking status: Never   Smokeless tobacco: Never   Tobacco  comments:    positive hx of passive tobacco smoke exposure  Vaping Use   Vaping Use: Never used  Substance Use Topics   Alcohol use: Yes    Alcohol/week: 1.0 standard drink    Types: 1 Glasses of wine per week    Comment: occasionally   Drug use: No    Family History  Problem Relation Age of Onset   Hyperlipidemia Mother    Colon polyps Mother    Nephrolithiasis Mother    Diverticulosis Mother    Nephrolithiasis Father    Depression Father        bipolar   Colon cancer Father 42       Lynch   Asthma Sister    Colon cancer Paternal Aunt 37   Colon cancer Paternal Uncle 86       x 2 uncles   Colon polyps Sister        paternal half sister; Lynch syndrome   Colon cancer Other        double great uncle from both mom and dads side   Asthma Sister    Allergies Sister    Allergies Brother    Clotting disorder  Maternal Uncle    Ovarian cancer Maternal Grandmother    Colon cancer Paternal Aunt    Diabetes Paternal Aunt    Esophageal cancer Neg Hx    Rectal cancer Neg Hx    Stomach cancer Neg Hx     Allergies  Allergen Reactions   Amoxicillin Hives   Penicillins Hives    Has patient had a PCN reaction causing immediate rash, facial/tongue/throat swelling, SOB or lightheadedness with hypotension: No Has patient had a PCN reaction causing severe rash involving mucus membranes or skin necrosis: Yes Has patient had a PCN reaction that required hospitalization: No Has patient had a PCN reaction occurring within the last 10 years: No If all of the above answers are "NO", then may proceed with Cephalosporin use.    Wellbutrin [Bupropion Hcl] Hives    Medication list has been reviewed and updated.  No current outpatient medications on file prior to visit.   No current facility-administered medications on file prior to visit.    Review of Systems:  As per HPI- otherwise negative.   Physical Examination: Vitals:   02/06/21 1305  BP: 120/64  Pulse: 70  Resp: 18  Temp: 98 F (36.7 C)  SpO2: 98%   Vitals:   02/06/21 1305  Weight: 217 lb 9.6 oz (98.7 kg)  Height: '5\' 4"'$  (1.626 m)   Body mass index is 37.35 kg/m. Ideal Body Weight: Weight in (lb) to have BMI = 25: 145.3  GEN: no acute distress.  Obese, looks well  HEENT: Atraumatic, Normocephalic.   Bilateral TM wnl, oropharynx normal.  PEERL,EOMI.   Ears and Nose: No external deformity. CV: RRR, No M/G/R. No JVD. No thrill. No extra heart sounds. PULM: CTA B, no wheezes, crackles, rhonchi. No retractions. No resp. distress. No accessory muscle use. ABD: S, NT, ND, +BS. No rebound. No HSM. EXTR: No c/c/e PSYCH: Normally interactive. Conversant.    Assessment and Plan: Physical exam  Screening, deficiency anemia, iron - Plan: CBC  Screening for diabetes mellitus - Plan: Comprehensive metabolic panel, Hemoglobin  A1c  Screening for hyperlipidemia - Plan: Lipid panel  Fatigue, unspecified type - Plan: VITAMIN D 25 Hydroxy (Vit-D Deficiency, Fractures)  Weight gain - Plan: TSH  Class 2 obesity due to excess calories without serious comorbidity with body mass index (BMI)  of 37.0 to 37.9 in adult  Need for influenza vaccination - Plan: Flu Vaccine QUAD 6+ mos PF IM (Fluarix Quad PF) CPE today She is working on weight loss She will continue to work on diet and exercise Flu vaccine Reminded tetanus is due   This visit occurred during the SARS-CoV-2 public health emergency.  Safety protocols were in place, including screening questions prior to the visit, additional usage of staff PPE, and extensive cleaning of exam room while observing appropriate contact time as indicated for disinfecting solutions.   Signed Lamar Blinks, MD   Addnd 9/20- received her labs as below- message to pt  Results for orders placed or performed in visit on 02/06/21  CBC  Result Value Ref Range   WBC 3.8 (L) 4.0 - 10.5 K/uL   RBC 4.32 3.87 - 5.11 Mil/uL   Platelets 304.0 150.0 - 400.0 K/uL   Hemoglobin 13.7 12.0 - 15.0 g/dL   HCT 41.1 36.0 - 46.0 %   MCV 95.1 78.0 - 100.0 fl   MCHC 33.3 30.0 - 36.0 g/dL   RDW 13.1 11.5 - 15.5 %  Comprehensive metabolic panel  Result Value Ref Range   Sodium 136 135 - 145 mEq/L   Potassium 3.9 3.5 - 5.1 mEq/L   Chloride 101 96 - 112 mEq/L   CO2 26 19 - 32 mEq/L   Glucose, Bld 90 70 - 99 mg/dL   BUN 10 6 - 23 mg/dL   Creatinine, Ser 0.82 0.40 - 1.20 mg/dL   Total Bilirubin 0.5 0.2 - 1.2 mg/dL   Alkaline Phosphatase 53 39 - 117 U/L   AST 14 0 - 37 U/L   ALT 11 0 - 35 U/L   Total Protein 7.4 6.0 - 8.3 g/dL   Albumin 4.2 3.5 - 5.2 g/dL   GFR 86.59 >60.00 mL/min   Calcium 9.2 8.4 - 10.5 mg/dL  Hemoglobin A1c  Result Value Ref Range   Hgb A1c MFr Bld 5.1 4.6 - 6.5 %  Lipid panel  Result Value Ref Range   Cholesterol 175 0 - 200 mg/dL   Triglycerides 69.0 0.0 - 149.0  mg/dL   HDL 58.80 >39.00 mg/dL   VLDL 13.8 0.0 - 40.0 mg/dL   LDL Cholesterol 102 (H) 0 - 99 mg/dL   Total CHOL/HDL Ratio 3    NonHDL 115.79   TSH  Result Value Ref Range   TSH 2.62 0.35 - 5.50 uIU/mL  VITAMIN D 25 Hydroxy (Vit-D Deficiency, Fractures)  Result Value Ref Range   VITD 16.14 (L) 30.00 - 100.00 ng/mL

## 2021-02-06 ENCOUNTER — Encounter: Payer: Self-pay | Admitting: Family Medicine

## 2021-02-06 ENCOUNTER — Ambulatory Visit (INDEPENDENT_AMBULATORY_CARE_PROVIDER_SITE_OTHER): Payer: 59 | Admitting: Family Medicine

## 2021-02-06 ENCOUNTER — Other Ambulatory Visit: Payer: Self-pay

## 2021-02-06 VITALS — BP 120/64 | HR 70 | Temp 98.0°F | Resp 18 | Ht 64.0 in | Wt 217.6 lb

## 2021-02-06 DIAGNOSIS — Z0000 Encounter for general adult medical examination without abnormal findings: Secondary | ICD-10-CM | POA: Diagnosis not present

## 2021-02-06 DIAGNOSIS — E6609 Other obesity due to excess calories: Secondary | ICD-10-CM

## 2021-02-06 DIAGNOSIS — Z13220 Encounter for screening for lipoid disorders: Secondary | ICD-10-CM | POA: Diagnosis not present

## 2021-02-06 DIAGNOSIS — Z130 Encounter for screening for diseases of the blood and blood-forming organs and certain disorders involving the immune mechanism: Secondary | ICD-10-CM | POA: Diagnosis not present

## 2021-02-06 DIAGNOSIS — Z131 Encounter for screening for diabetes mellitus: Secondary | ICD-10-CM | POA: Diagnosis not present

## 2021-02-06 DIAGNOSIS — R5383 Other fatigue: Secondary | ICD-10-CM | POA: Diagnosis not present

## 2021-02-06 DIAGNOSIS — Z6837 Body mass index (BMI) 37.0-37.9, adult: Secondary | ICD-10-CM

## 2021-02-06 DIAGNOSIS — Z23 Encounter for immunization: Secondary | ICD-10-CM | POA: Diagnosis not present

## 2021-02-06 DIAGNOSIS — R635 Abnormal weight gain: Secondary | ICD-10-CM | POA: Diagnosis not present

## 2021-02-06 DIAGNOSIS — E559 Vitamin D deficiency, unspecified: Secondary | ICD-10-CM

## 2021-02-06 DIAGNOSIS — E66812 Obesity, class 2: Secondary | ICD-10-CM

## 2021-02-06 LAB — HEMOGLOBIN A1C: Hgb A1c MFr Bld: 5.1 % (ref 4.6–6.5)

## 2021-02-06 LAB — CBC
HCT: 41.1 % (ref 36.0–46.0)
Hemoglobin: 13.7 g/dL (ref 12.0–15.0)
MCHC: 33.3 g/dL (ref 30.0–36.0)
MCV: 95.1 fl (ref 78.0–100.0)
Platelets: 304 10*3/uL (ref 150.0–400.0)
RBC: 4.32 Mil/uL (ref 3.87–5.11)
RDW: 13.1 % (ref 11.5–15.5)
WBC: 3.8 10*3/uL — ABNORMAL LOW (ref 4.0–10.5)

## 2021-02-06 NOTE — Patient Instructions (Signed)
Good to see you again today- I will be in touch with your labs asap Flu shot today Get new covid booster!   I will reach out to GI- Dr Carlean Purl- and ask them to call you and schedule colon Keep working on diet and exercise for gradual weight loss  Take care!

## 2021-02-07 ENCOUNTER — Encounter: Payer: Self-pay | Admitting: Family Medicine

## 2021-02-07 ENCOUNTER — Telehealth: Payer: Self-pay

## 2021-02-07 LAB — COMPREHENSIVE METABOLIC PANEL
ALT: 11 U/L (ref 0–35)
AST: 14 U/L (ref 0–37)
Albumin: 4.2 g/dL (ref 3.5–5.2)
Alkaline Phosphatase: 53 U/L (ref 39–117)
BUN: 10 mg/dL (ref 6–23)
CO2: 26 mEq/L (ref 19–32)
Calcium: 9.2 mg/dL (ref 8.4–10.5)
Chloride: 101 mEq/L (ref 96–112)
Creatinine, Ser: 0.82 mg/dL (ref 0.40–1.20)
GFR: 86.59 mL/min (ref >60.00)
Glucose, Bld: 90 mg/dL (ref 70–99)
Potassium: 3.9 mEq/L (ref 3.5–5.1)
Sodium: 136 mEq/L (ref 135–145)
Total Bilirubin: 0.5 mg/dL (ref 0.2–1.2)
Total Protein: 7.4 g/dL (ref 6.0–8.3)

## 2021-02-07 LAB — TSH: TSH: 2.62 u[IU]/mL (ref 0.35–5.50)

## 2021-02-07 LAB — LIPID PANEL
Cholesterol: 175 mg/dL (ref 0–200)
HDL: 58.8 mg/dL (ref >39.00)
LDL Cholesterol: 102 mg/dL — ABNORMAL HIGH (ref 0–99)
NonHDL: 115.79
Total CHOL/HDL Ratio: 3
Triglycerides: 69 mg/dL (ref 0.0–149.0)
VLDL: 13.8 mg/dL (ref 0.0–40.0)

## 2021-02-07 LAB — VITAMIN D 25 HYDROXY (VIT D DEFICIENCY, FRACTURES): VITD: 16.14 ng/mL — ABNORMAL LOW (ref 30.00–100.00)

## 2021-02-07 MED ORDER — VITAMIN D3 1.25 MG (50000 UT) PO CAPS: ORAL_CAPSULE | ORAL | 0 refills | Status: DC

## 2021-02-07 NOTE — Telephone Encounter (Signed)
-----   Message from Gatha Mayer, MD sent at 02/06/2021  4:45 PM EDT ----- She really needs a genetics appointment and evaluation - which we scheduled and was not completed (? If she cancelled, no show)  That will help me know if she really needs annual colonoscopy +/- EGD  Gedalia Mcmillon,  Please inform patient that she needs to do the genetics evaluation so we can determine appropriate endoscopic screening intervals.  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  She is overdue for one year follow-up colon  Thank you!

## 2021-02-07 NOTE — Addendum Note (Signed)
Addended by: Lamar Blinks C on: 02/07/2021 09:29 AM   Modules accepted: Orders

## 2021-02-08 NOTE — Telephone Encounter (Signed)
Patient did cancel and no show a few times Left message for patient to call back

## 2021-02-09 NOTE — Telephone Encounter (Signed)
Her mailbox is now full on her VM. I will continue to try and reach her

## 2021-02-13 NOTE — Telephone Encounter (Signed)
Patient's mailbox remains full.  I will mail her a letter and send her message in [place].

## 2021-02-23 ENCOUNTER — Ambulatory Visit: Payer: 59 | Admitting: Rehabilitative and Restorative Service Providers"

## 2021-03-01 ENCOUNTER — Other Ambulatory Visit: Payer: Self-pay | Admitting: Physical Therapy

## 2021-03-01 DIAGNOSIS — G8929 Other chronic pain: Secondary | ICD-10-CM

## 2021-03-01 DIAGNOSIS — M25512 Pain in left shoulder: Secondary | ICD-10-CM

## 2021-03-07 ENCOUNTER — Ambulatory Visit: Payer: 59 | Admitting: Family Medicine

## 2021-03-16 ENCOUNTER — Other Ambulatory Visit: Payer: Self-pay | Admitting: Family Medicine

## 2021-03-16 ENCOUNTER — Other Ambulatory Visit: Payer: Self-pay

## 2021-03-16 ENCOUNTER — Ambulatory Visit (INDEPENDENT_AMBULATORY_CARE_PROVIDER_SITE_OTHER): Payer: 59 | Admitting: Rehabilitative and Restorative Service Providers"

## 2021-03-16 ENCOUNTER — Encounter: Payer: Self-pay | Admitting: Rehabilitative and Restorative Service Providers"

## 2021-03-16 DIAGNOSIS — M25512 Pain in left shoulder: Secondary | ICD-10-CM | POA: Diagnosis not present

## 2021-03-16 DIAGNOSIS — G8929 Other chronic pain: Secondary | ICD-10-CM

## 2021-03-16 DIAGNOSIS — R293 Abnormal posture: Secondary | ICD-10-CM

## 2021-03-16 DIAGNOSIS — M6281 Muscle weakness (generalized): Secondary | ICD-10-CM | POA: Diagnosis not present

## 2021-03-16 DIAGNOSIS — M25612 Stiffness of left shoulder, not elsewhere classified: Secondary | ICD-10-CM | POA: Diagnosis not present

## 2021-03-16 NOTE — Addendum Note (Signed)
Addended by: Douglass Rivers T on: 03/16/2021 11:01 AM   Modules accepted: Orders

## 2021-03-16 NOTE — Therapy (Signed)
[facility] [address] [place], [place], [address] Phone: [phone]   Fax:  [phone]  Physical Therapy Evaluation  Patient Details  Name: Susan Roth MRN: [id number] Date of Birth: 10/22/1975 Referring Provider (PT): Gregor Hams, MD   Encounter Date: 03/16/2021   PT End of Session - 03/16/21 0802     Visit Number 1    Number of Visits 20    Date for PT Re-Evaluation 05/25/21    Authorization Type [employer] 20% coinsurance    Progress Note Due on Visit 10    PT Start Time 0807    PT Stop Time 0841    PT Time Calculation (min) 34 min    Activity Tolerance Patient limited by pain    Behavior During Therapy [facility] for tasks assessed/performed             Past Medical History:  Diagnosis Date   Abnormal Pap smear of cervix    cryo   Allergic rhinitis    Allergy    Constipation    is every day- pt off Linzess    Dysfunction of eustachian tube    Endometriosis    GERD (gastroesophageal reflux disease)    occasional   Herpes zoster without mention of complication    History of kidney stones    IBS (irritable bowel syndrome)    Neuropathy    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    Obesity     Past Surgical History:  Procedure Laterality Date   BILATERAL SALPINGECTOMY Right 08/04/2012   Procedure: BILATERAL SALPINGECTOMY;  Surgeon: Emily Filbert, MD;  Location: [facility];  Service: Gynecology;  Laterality: Right;   CESAREAN SECTION  1998   x1   CHOLECYSTECTOMY N/A 01/30/2018   Procedure: LAPAROSCOPIC CHOLECYSTECTOMY;  Surgeon: Greer Pickerel, MD;  Location: [facility];  Service: General;  Laterality: N/A;   COLONOSCOPY     LAPAROSCOPIC TUBAL LIGATION N/A 08/04/2012   Procedure: LAPAROSCOPIC TUBAL LIGATION;  Surgeon: Emily Filbert, MD;  Location: [facility];  Service: Gynecology;  Laterality: N/A;  Operative lsc for right salpingectomy & Mirena insertion- pt had a  bilat salphingectomy NOT tubal    TONSILLECTOMY     as an adult -  2011   WISDOM TOOTH EXTRACTION      There were no vitals filed for this visit.   Subjective Assessment - 03/16/21 0802     Subjective Pt. stated onset around June after reaching into dryer with increased pain ever since.  Pt. indicated she has trouble reaching and lifting over shoulder/head height.  Pt. indicated pain mostly located in shoulder but recently has had some Lt neck pain as well.    Limitations House hold activities;Lifting    Diagnostic tests xrays negative.  MSK [place] subacromial bursitis.  MRI to be performed    Patient Stated Goals Reduce pain    Currently in Pain? Yes    Pain Score 6    at worst   Pain Location Shoulder    Pain Orientation Left    Pain Descriptors / Indicators Radiating;Sharp    Pain Type Chronic pain    Pain Onset More than a month ago    Pain Frequency Intermittent    Aggravating Factors  reaching, lifting overhead, some trouble sleeping due to symptoms    Pain Relieving Factors pressure, rest from aggravating activity                [facility] PT Assessment - 03/16/21 0001  Assessment   Medical Diagnosis M25.512,G89.29 (ICD-10-CM) - Chronic left shoulder pain    Referring Provider (PT) Gregor Hams, MD    Onset Date/Surgical Date 12/19/20    Hand Dominance Right      Precautions   Precautions None      Balance Screen   Has the patient fallen in the past 6 months Yes    How many times? 1    Has the patient had a decrease in activity level because of a fear of falling?  No    Is the patient reluctant to leave their home because of a fear of falling?  No      Home Environment   Additional Comments Stairs in house      Prior Function   Vocation Requirements Works at [employer] (limited due to arm)      Cognition   Overall Cognitive Status Within Functional Limits for tasks assessed      Observation/Other Assessments   Focus on Therapeutic Outcomes (FOTO)  intake 52%, predicted 68%      Posture/Postural Control    Posture/Postural Control Postural limitations    Postural Limitations Rounded Shoulders      ROM / Strength   AROM / PROM / Strength Strength;PROM;AROM      AROM   Overall AROM Comments Rt shoulder WFL at this time.  Pain noted at end range all Lt shoulder AROM.  Elevation against gravity into flexion 105 degrees c shrug and pain noted Lt shoulder.    AROM Assessment Site Shoulder    Right/Left Shoulder Left;Right    Left Shoulder Flexion 110 Degrees   measured in supine   Left Shoulder ABduction 60 Degrees   measured in supine   Left Shoulder Internal Rotation 42 Degrees   measured in supine 30 deg abduction   Left Shoulder External Rotation 15 Degrees   measured in supine 30 deg abduction     PROM   Overall PROM Comments Pain limitations in all directions (capsular tightness noted at end range)    PROM Assessment Site Shoulder    Right/Left Shoulder Left;Right    Left Shoulder Flexion 116 Degrees    Left Shoulder ABduction 65 Degrees    Left Shoulder Internal Rotation 45 Degrees    Left Shoulder External Rotation 15 Degrees      Strength   Overall Strength Comments Pain noted in Lt shoulder flexion, abduction MMT    Strength Assessment Site Shoulder;Elbow    Right/Left Shoulder Left;Right    Right Shoulder Flexion 5/5   32.9, 34.2 lbs   Right Shoulder ABduction 5/5   20.5, 21.9 lbs   Right Shoulder External Rotation 5/5   19.5, 18.8 lbs   Left Shoulder Flexion 3+/5   10.9, 12.2 lbs   Left Shoulder ABduction 3+/5   8.0, 9.3 lbs   Left Shoulder External Rotation 4/5   15., 11.2   Right/Left Elbow Left;Right    Right Elbow Flexion 5/5    Right Elbow Extension 5/5    Left Elbow Flexion 5/5    Left Elbow Extension 5/5      Special Tests   Other special tests (+) Lt shoulder Michel Bickers, empty can (for pain/mild weakness), (-) Drop arm Lt.  Capsular tightness in all directions Lt shoulder.  Painful arc noted Lt shoulder                           [facility]  Adult PT Treatment/Exercise - 03/16/21 0001       Exercises   Exercises Other Exercises;Shoulder    Other Exercises  HEP instruction/performance c cues for techniques, handout provided.  Trial set performed of each for comprehension and symptom assessment.  HEP consisting of supine passive Rt arm assisted shoulder flexion, ER wand c arm at side 5 sec holds, isometric flexion, abduction Lt shoulder at wall 5 secs painfree, seated scapular retraction 5 sec holds      Manual Therapy   Manual therapy comments PROM to tolerance, g2-g3 mid range inferior jt mobs Lt shoulder, posterior joint mobs Lt shoulder c arm in neutral   pain response noted                    PT Education - 03/16/21 0802     Education Details HEP, POC    Person(s) Educated Patient    Methods Explanation;Demonstration;Verbal cues;Handout    Comprehension Returned demonstration;Verbalized understanding              PT Short Term Goals - 03/16/21 0802       PT SHORT TERM GOAL #1   Title Patient will demonstrate independent use of home exercise program to maintain progress from in clinic treatments.    Time 3    Period Weeks    Status New    Target Date 04/06/21               PT Long Term Goals - 03/16/21 0803       PT LONG TERM GOAL #1   Title Patient will demonstrate/report pain at worst less than or equal to 2/10 to facilitate minimal limitation in daily activity secondary to pain symptoms.    Time 10    Period Weeks    Status New    Target Date 05/25/21      PT LONG TERM GOAL #2   Title Patient will demonstrate independent use of home exercise program to facilitate ability to maintain/progress functional gains from skilled physical therapy services.    Time 10    Period Weeks    Status New    Target Date 05/25/21      PT LONG TERM GOAL #3   Title Pt. will demonstrate FOTO outcome > or = 68 % to indicated reduced disability due to condition.    Time 10    Period Weeks    Status  New    Target Date 05/25/21      PT LONG TERM GOAL #4   Title Patient will demonstrate Lt [place] joint mobility WFL to facilitate usual self care, dressing, reaching overhead at PLOF s limitation due to symptoms.    Time 10    Period Weeks    Status New    Target Date 05/25/21      PT LONG TERM GOAL #5   Title Patient will demonstrate Lt UE MMT 5/5 with 15% dynamometry throughout to facilitate usual lifting, carrying in functional activity to PLOF s limitation.    Time 10    Period Weeks    Status New    Target Date 05/25/21      Additional Long Term Goals   Additional Long Term Goals Yes      PT LONG TERM GOAL #6   Title Pt. will demonstrate/report ability to sleep s trouble due to symptoms.    Time 10    Period Weeks    Status New    Target Date 05/25/21  Plan - 03/16/21 0803     Clinical Impression Statement Patient is a 45 y.o. who comes to clinic with complaints of Lt shoulder pain with mobility, strength and movement coordination deficits that impair their ability to perform usual daily and recreational functional activities without increase difficulty/symptoms at this time.  Patient to benefit from skilled PT services to address impairments and limitations to improve to previous level of function without restriction secondary to condition.    Personal Factors and Comorbidities Comorbidity 3+    Comorbidities GERD, IBS, history of neuropathy in lower extremity    Examination-Activity Limitations Sleep;Carry;Dressing;Hygiene/Grooming;Lift;Reach Overhead    Examination-Participation Restrictions Occupation;Community Activity;Cleaning;Other;Laundry   exercise   Stability/Clinical Decision Making Stable/Uncomplicated    Clinical Decision Making Low    Rehab Potential Good    PT Frequency Other (comment)   1-2x/week   PT Duration Other (comment)   up to 10 weeks   PT Treatment/Interventions ADLs/Self Care Home Management;Cryotherapy;Electrical  Stimulation;Iontophoresis 4mg /ml Dexamethasone;Moist Heat;Balance training;Therapeutic exercise;Therapeutic activities;Functional mobility training;Stair training;Gait training;DME Instruction;Ultrasound;Neuromuscular re-education;Patient/family education;Passive range of motion;Spinal Manipulations;Joint Manipulations;Dry needling;Taping;Manual techniques    PT Next Visit Plan Progressive manual mobilizations/prom for mobility gains, encouraged AAROM as tolerated    PT Home Exercise Plan [email or web address]    Consulted and Agree with Plan of Care Patient             Patient will benefit from skilled therapeutic intervention in order to improve the following deficits and impairments:  Hypomobility, Pain, Impaired UE functional use, Decreased strength, Decreased activity tolerance, Decreased mobility, Decreased range of motion, Impaired perceived functional ability, Improper body mechanics, Impaired flexibility, Decreased coordination, Decreased endurance  Visit Diagnosis: Chronic left shoulder pain  Muscle weakness (generalized)  Stiffness of left shoulder, not elsewhere classified  Abnormal posture     Problem List Patient Active Problem List   Diagnosis Date Noted   Family history of Lynch syndrome 11/05/2018   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 [place], [place], [address] Phone: [phone]   Fax:  [phone]  Name: Susan Roth MRN: [id number] Date of Birth: 03-04-1976

## 2021-03-16 NOTE — Patient Instructions (Signed)
Access Code: [email or web address] URL: [email or web address] Date: 03/16/2021 Prepared by: Scot Jun  Exercises Supine Shoulder Flexion PROM - 2-3 x daily - 7 x weekly - 1-2 sets - 10 reps - 5 hold Supine Shoulder External Rotation in 45 Degrees Abduction AAROM with [place] - 2-3 x daily - 7 x weekly - 1-2 sets - 10 reps - 5 hold Seated Scapular Retraction - 2 x daily - 7 x weekly - 2 sets - 10 reps - 5 hold Isometric Shoulder Flexion at Wall - 2 x daily - 7 x weekly - 1 sets - 10-15 reps - 5 hold Isometric Shoulder Abduction at Wall - 2 x daily - 7 x weekly - 1 sets - 10-15 reps - 5 hold

## 2021-03-23 ENCOUNTER — Encounter: Payer: Self-pay | Admitting: Physical Therapy

## 2021-03-23 ENCOUNTER — Ambulatory Visit (INDEPENDENT_AMBULATORY_CARE_PROVIDER_SITE_OTHER): Payer: 59 | Admitting: Physical Therapy

## 2021-03-23 ENCOUNTER — Other Ambulatory Visit: Payer: Self-pay

## 2021-03-23 DIAGNOSIS — R293 Abnormal posture: Secondary | ICD-10-CM

## 2021-03-23 DIAGNOSIS — M25612 Stiffness of left shoulder, not elsewhere classified: Secondary | ICD-10-CM | POA: Diagnosis not present

## 2021-03-23 DIAGNOSIS — G8929 Other chronic pain: Secondary | ICD-10-CM

## 2021-03-23 DIAGNOSIS — M6281 Muscle weakness (generalized): Secondary | ICD-10-CM

## 2021-03-23 DIAGNOSIS — M25512 Pain in left shoulder: Secondary | ICD-10-CM | POA: Diagnosis not present

## 2021-03-23 NOTE — Therapy (Signed)
[facility] Therapy [address] [place], [place], [address] Phone: [phone]   Fax:  [phone]  Physical Therapy Treatment  Patient Details  Name: Susan Roth MRN: [id number] Date of Birth: 07-29-1975 Referring Provider (PT): Gregor Hams, MD   Encounter Date: 03/23/2021   PT End of Session - 03/23/21 0845     Visit Number 2    Number of Visits 20    Date for PT Re-Evaluation 05/25/21    Authorization Type [employer] 20% coinsurance    Progress Note Due on Visit 10    PT Start Time 0802    PT Stop Time 0840    PT Time Calculation (min) 38 min    Activity Tolerance Patient limited by pain;Patient tolerated treatment well    Behavior During Therapy [facility] for tasks assessed/performed             Past Medical History:  Diagnosis Date   Abnormal Pap smear of cervix    cryo   Allergic rhinitis    Allergy    Constipation    is every day- pt off Linzess    Dysfunction of eustachian tube    Endometriosis    GERD (gastroesophageal reflux disease)    occasional   Herpes zoster without mention of complication    History of kidney stones    IBS (irritable bowel syndrome)    Neuropathy    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    Obesity     Past Surgical History:  Procedure Laterality Date   BILATERAL SALPINGECTOMY Right 08/04/2012   Procedure: BILATERAL SALPINGECTOMY;  Surgeon: Emily Filbert, MD;  Location: [facility];  Service: Gynecology;  Laterality: Right;   CESAREAN SECTION  1998   x1   CHOLECYSTECTOMY N/A 01/30/2018   Procedure: LAPAROSCOPIC CHOLECYSTECTOMY;  Surgeon: Greer Pickerel, MD;  Location: [facility];  Service: General;  Laterality: N/A;   COLONOSCOPY     LAPAROSCOPIC TUBAL LIGATION N/A 08/04/2012   Procedure: LAPAROSCOPIC TUBAL LIGATION;  Surgeon: Emily Filbert, MD;  Location: [facility];  Service: Gynecology;  Laterality: N/A;  Operative lsc for right salpingectomy & Mirena insertion- pt had a  bilat salphingectomy NOT tubal     TONSILLECTOMY     as an adult - 2011   WISDOM TOOTH EXTRACTION      There were no vitals filed for this visit.   Subjective Assessment - 03/23/21 0807     Subjective Pt relays her shoulder is feeling a little better and she has been doing the exercises, she does not have any quesitons about them. She has no pain at rest and about 6/10 with reaching up.    Limitations House hold activities;Lifting    Diagnostic tests xrays negative.  MSK [place] subacromial bursitis.  MRI to be performed    Patient Stated Goals Reduce pain    Pain Onset More than a month ago              [facility] Adult PT Treatment/Exercise - 03/23/21 0001       Shoulder Exercises: Seated   Other Seated Exercises table slide stretching AAROM ER,flexion, and abd on Lt 10 sec X10      Shoulder Exercises: Pulleys   Flexion 2 minutes    ABduction 2 minutes      Shoulder Exercises: ROM/Strengthening   UBE (Upper Arm Bike) L2 2 min fwd, 2 min retro      Manual Therapy   Manual therapy comments PROM to tolerance, g2-g3  mid range inferior jt mobs Lt shoulder, posterior joint mobs Lt shoulder c arm in neutral, distraction mobs                       PT Short Term Goals - 03/16/21 0802       PT SHORT TERM GOAL #1   Title Patient will demonstrate independent use of home exercise program to maintain progress from in clinic treatments.    Time 3    Period Weeks    Status New    Target Date 04/06/21               PT Long Term Goals - 03/16/21 0803       PT LONG TERM GOAL #1   Title Patient will demonstrate/report pain at worst less than or equal to 2/10 to facilitate minimal limitation in daily activity secondary to pain symptoms.    Time 10    Period Weeks    Status New    Target Date 05/25/21      PT LONG TERM GOAL #2   Title Patient will demonstrate independent use of home exercise program to facilitate ability to maintain/progress functional gains from skilled physical therapy services.     Time 10    Period Weeks    Status New    Target Date 05/25/21      PT LONG TERM GOAL #3   Title Pt. will demonstrate FOTO outcome > or = 68 % to indicated reduced disability due to condition.    Time 10    Period Weeks    Status New    Target Date 05/25/21      PT LONG TERM GOAL #4   Title Patient will demonstrate Lt [place] joint mobility WFL to facilitate usual self care, dressing, reaching overhead at PLOF s limitation due to symptoms.    Time 10    Period Weeks    Status New    Target Date 05/25/21      PT LONG TERM GOAL #5   Title Patient will demonstrate Lt UE MMT 5/5 with 15% dynamometry throughout to facilitate usual lifting, carrying in functional activity to PLOF s limitation.    Time 10    Period Weeks    Status New    Target Date 05/25/21      Additional Long Term Goals   Additional Long Term Goals Yes      PT LONG TERM GOAL #6   Title Pt. will demonstrate/report ability to sleep s trouble due to symptoms.    Time 10    Period Weeks    Status New    Target Date 05/25/21                   Plan - 03/23/21 0846     Clinical Impression Statement Session focused on improving Lt shouler ROM and mobility to her overall tolerance. She does need cuing to reduce muscle guarding with manual therapy. I showed her table slide stretches that she can also perform at work/home to furhter help with ROM gains.    Personal Factors and Comorbidities Comorbidity 3+    Comorbidities GERD, IBS, history of neuropathy in lower extremity    Examination-Activity Limitations Sleep;Carry;Dressing;Hygiene/Grooming;Lift;Reach Overhead    Examination-Participation Restrictions Occupation;Community Activity;Cleaning;Other;Laundry   exercise   Stability/Clinical Decision Making Stable/Uncomplicated    Rehab Potential Good    PT Frequency Other (comment)   1-2x/week   PT Duration Other (comment)   up  to 10 weeks   PT Treatment/Interventions ADLs/Self Care Home  Management;Cryotherapy;Electrical Stimulation;Iontophoresis 4mg /ml Dexamethasone;Moist Heat;Balance training;Therapeutic exercise;Therapeutic activities;Functional mobility training;Stair training;Gait training;DME Instruction;Ultrasound;Neuromuscular re-education;Patient/family education;Passive range of motion;Spinal Manipulations;Joint Manipulations;Dry needling;Taping;Manual techniques    PT Next Visit Plan Progressive manual mobilizations/prom for mobility gains, encouraged AAROM as tolerated    PT Home Exercise Plan [email or web address]    Consulted and Agree with Plan of Care Patient             Patient will benefit from skilled therapeutic intervention in order to improve the following deficits and impairments:  Hypomobility, Pain, Impaired UE functional use, Decreased strength, Decreased activity tolerance, Decreased mobility, Decreased range of motion, Impaired perceived functional ability, Improper body mechanics, Impaired flexibility, Decreased coordination, Decreased endurance  Visit Diagnosis: Chronic left shoulder pain  Muscle weakness (generalized)  Stiffness of left shoulder, not elsewhere classified  Abnormal posture     Problem List Patient Active Problem List   Diagnosis Date Noted   Family history of Lynch syndrome 11/05/2018   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

## 2021-03-27 ENCOUNTER — Encounter: Payer: 59 | Admitting: Rehabilitative and Restorative Service Providers"

## 2021-04-03 ENCOUNTER — Inpatient Hospital Stay: Admission: RE | Admit: 2021-04-03 | Payer: 59 | Source: Ambulatory Visit

## 2021-04-03 ENCOUNTER — Other Ambulatory Visit: Payer: 59

## 2021-04-04 ENCOUNTER — Ambulatory Visit: Payer: 59 | Admitting: Rehabilitative and Restorative Service Providers"

## 2021-04-04 ENCOUNTER — Encounter: Payer: Self-pay | Admitting: Rehabilitative and Restorative Service Providers"

## 2021-04-04 ENCOUNTER — Other Ambulatory Visit: Payer: Self-pay

## 2021-04-04 DIAGNOSIS — M25612 Stiffness of left shoulder, not elsewhere classified: Secondary | ICD-10-CM

## 2021-04-04 DIAGNOSIS — M25512 Pain in left shoulder: Secondary | ICD-10-CM

## 2021-04-04 DIAGNOSIS — R293 Abnormal posture: Secondary | ICD-10-CM

## 2021-04-04 DIAGNOSIS — M6281 Muscle weakness (generalized): Secondary | ICD-10-CM | POA: Diagnosis not present

## 2021-04-04 DIAGNOSIS — G8929 Other chronic pain: Secondary | ICD-10-CM

## 2021-04-04 NOTE — Therapy (Addendum)
[facility] Physical Therapy [address] [place], [place], [address] Phone: [phone]   Fax:  [phone]  Physical Therapy Treatment /Discharge   Patient Details  Name: Susan Roth MRN: [id number] Date of Birth: [birth year] Referring Provider (PT): Gregor Hams, MD   Encounter Date: 04/04/2021   PT End of Session - 04/04/21 1612     Visit Number 3    Number of Visits 20    Date for PT Re-Evaluation 05/25/21    Authorization Type [employer] 20% coinsurance    Progress Note Due on Visit 10    PT Start Time 1609    PT Stop Time 1640    PT Time Calculation (min) 31 min    Activity Tolerance Patient limited by pain;Patient tolerated treatment well    Behavior During Therapy [facility] for tasks assessed/performed             Past Medical History:  Diagnosis Date   Abnormal Pap smear of cervix    cryo   Allergic rhinitis    Allergy    Constipation    is every day- pt off Linzess    Dysfunction of eustachian tube    Endometriosis    GERD (gastroesophageal reflux disease)    occasional   Herpes zoster without mention of complication    History of kidney stones    IBS (irritable bowel syndrome)    Neuropathy    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    Obesity     Past Surgical History:  Procedure Laterality Date   BILATERAL SALPINGECTOMY Right 08/04/2012   Procedure: BILATERAL SALPINGECTOMY;  Surgeon: Emily Filbert, MD;  Location: [facility];  Service: Gynecology;  Laterality: Right;   CESAREAN SECTION  1998   x1   CHOLECYSTECTOMY N/A 01/30/2018   Procedure: LAPAROSCOPIC CHOLECYSTECTOMY;  Surgeon: Greer Pickerel, MD;  Location: [facility];  Service: General;  Laterality: N/A;   COLONOSCOPY     LAPAROSCOPIC TUBAL LIGATION N/A 08/04/2012   Procedure: LAPAROSCOPIC TUBAL LIGATION;  Surgeon: Emily Filbert, MD;  Location: [facility];  Service: Gynecology;  Laterality: N/A;  Operative lsc for right salpingectomy & Mirena insertion- pt had a  bilat salphingectomy NOT  tubal    TONSILLECTOMY     as an adult - 2011   WISDOM TOOTH EXTRACTION      There were no vitals filed for this visit.   Subjective Assessment - 04/04/21 1610     Subjective Pt. indicated 5/10 at worst with activity.  Pt. indicated feeling like it is doing better and feeling like she was going to cancel MRI.  Not using arm like normal at this time though per report.    Limitations Roth hold activities;Lifting    Diagnostic tests xrays negative.  MSK [place] subacromial bursitis.  MRI to be performed    Patient Stated Goals Reduce pain    Currently in Pain? Yes    Pain Score 5    at worst   Pain Location Shoulder    Pain Orientation Left    Pain Descriptors / Indicators Aching;Dull;Tightness    Pain Type Chronic pain    Pain Onset More than a month ago    Aggravating Factors  overhead reaching/movement at end range    Pain Relieving Factors rest,                [facility] PT Assessment - 04/04/21 0001       Assessment   Medical Diagnosis M25.512,G89.29 (ICD-10-CM) - Chronic left  shoulder pain    Referring Provider (PT) Gregor Hams, MD    Onset Date/Surgical Date 12/19/20    Hand Dominance Right      AROM   Left Shoulder Flexion 143 Degrees   in supine   Left Shoulder ABduction 90 Degrees   in supine   Left Shoulder Internal Rotation 55 Degrees   in 45 deg abduction   Left Shoulder External Rotation 30 Degrees   in 45 deg abduction in supine                          [facility] Adult PT Treatment/Exercise - 04/04/21 0001       Exercises   Other Exercises  HEP review      Shoulder Exercises: Sidelying   External Rotation Left   2 x 10 c towel at side   ABduction Left   2 x 10     Shoulder Exercises: ROM/Strengthening   UBE (Upper Arm Bike) Lvl 2.5 4 mins fwd/back each way      Manual Therapy   Manual therapy comments g3 inferior glides in flexion, scaption and abduction.  Mobilization c movement to Lt shoulder c ER                     PT  Education - 04/04/21 1633     Education Details HEP progression.    Person(s) Educated Patient    Methods Explanation;Demonstration;Verbal cues;Handout    Comprehension Returned demonstration;Verbalized understanding              PT Short Term Goals - 04/04/21 1632       PT SHORT TERM GOAL #1   Title Patient will demonstrate independent use of home exercise program to maintain progress from in clinic treatments.    Time 3    Period Weeks    Status Achieved    Target Date 04/06/21               PT Long Term Goals - 04/04/21 1633       PT LONG TERM GOAL #1   Title Patient will demonstrate/report pain at worst less than or equal to 2/10 to facilitate minimal limitation in daily activity secondary to pain symptoms.    Time 10    Period Weeks    Status On-going    Target Date 05/25/21      PT LONG TERM GOAL #2   Title Patient will demonstrate independent use of home exercise program to facilitate ability to maintain/progress functional gains from skilled physical therapy services.    Time 10    Period Weeks    Status On-going    Target Date 05/25/21      PT LONG TERM GOAL #3   Title Pt. will demonstrate FOTO outcome > or = 68 % to indicated reduced disability due to condition.    Time 10    Period Weeks    Status On-going    Target Date 05/25/21      PT LONG TERM GOAL #4   Title Patient will demonstrate Lt [place] joint mobility WFL to facilitate usual self care, dressing, reaching overhead at PLOF s limitation due to symptoms.    Time 10    Period Weeks    Status On-going    Target Date 05/25/21      PT LONG TERM GOAL #5   Title Patient will demonstrate Lt UE MMT 5/5 with 15% dynamometry throughout to  facilitate usual lifting, carrying in functional activity to PLOF s limitation.    Time 10    Period Weeks    Status On-going    Target Date 05/25/21      PT LONG TERM GOAL #6   Title Pt. will demonstrate/report ability to sleep s trouble due to symptoms.     Time 10    Period Weeks    Status On-going    Target Date 05/25/21                   Plan - 04/04/21 1631     Clinical Impression Statement AROM reassessment today showed good improvement from evaluation but still showing limitations in capsular pattern c pain at end range.  Progressed home activity to promote longer stretching for capsular stretching and muscle relexation.  Continued skilled PT services indicated at this time to progress.    Personal Factors and Comorbidities Comorbidity 3+    Comorbidities GERD, IBS, history of neuropathy in lower extremity    Examination-Activity Limitations Sleep;Carry;Dressing;Hygiene/Grooming;Lift;Reach Overhead    Examination-Participation Restrictions Occupation;Community Activity;Cleaning;Other;Laundry   exercise   Stability/Clinical Decision Making Stable/Uncomplicated    Rehab Potential Good    PT Frequency Other (comment)   1-2x/week   PT Duration Other (comment)   up to 10 weeks   PT Treatment/Interventions ADLs/Self Care Home Management;Cryotherapy;Electrical Stimulation;Iontophoresis 24m/ml Dexamethasone;Moist Heat;Balance training;Therapeutic exercise;Therapeutic activities;Functional mobility training;Stair training;Gait training;DME Instruction;Ultrasound;Neuromuscular re-education;Patient/family education;Passive range of motion;Spinal Manipulations;Joint Manipulations;Dry needling;Taping;Manual techniques    PT Next Visit Plan manual end range mobilizations, progressive AROM gains.    PT Home Exercise Plan [email or web address]   Consulted and Agree with Plan of Care Patient             Patient will benefit from skilled therapeutic intervention in order to improve the following deficits and impairments:  Hypomobility, Pain, Impaired UE functional use, Decreased strength, Decreased activity tolerance, Decreased mobility, Decreased range of motion, Impaired perceived functional ability, Improper body mechanics, Impaired flexibility,  Decreased coordination, Decreased endurance  Visit Diagnosis: Chronic left shoulder pain  Muscle weakness (generalized)  Stiffness of left shoulder, not elsewhere classified  Abnormal posture     Problem List Patient Active Problem List   Diagnosis Date Noted   Family history of Lynch syndrome 11/05/2018   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 Patient goals were partially met. Patient is being discharged due to not returning since the last visit.  MScot Jun PT, DPT, OCS, ATC 05/23/21  1:20 PM     [facility] [address] [address]Phone: [phone]  Fax:  [phone] Name: Susan HOUSEMRN: [id number] of Birth: 12/21/1975-08-04

## 2021-04-04 NOTE — Patient Instructions (Signed)
Access Code: [email or web address] URL: [email or web address] Date: 04/04/2021 Prepared by: Scot Jun  Exercises Supine Shoulder Flexion PROM - 2-3 x daily - 7 x weekly - 1-2 sets - 10 reps - 5 hold Supine Shoulder External Rotation in 45 Degrees Abduction AAROM with [place] - 2-3 x daily - 7 x weekly - 1-2 sets - 10 reps - 5 hold Seated Scapular Retraction - 2 x daily - 7 x weekly - 2 sets - 10 reps - 5 hold Isometric Shoulder Flexion at Wall - 2 x daily - 7 x weekly - 1 sets - 10-15 reps - 5 hold Isometric Shoulder Abduction at Wall - 2 x daily - 7 x weekly - 1 sets - 10-15 reps - 5 hold Supine Shoulder External Rotation Stretch - 3-5 x daily - 7 x weekly - 1 sets - 1 reps - 30 seconds + hold Supine Shoulder Internal Rotation Stretch - 3-5 x daily - 7 x weekly - 1 sets - 1 reps - 30 seconds + hold Sidelying Shoulder Abduction Palm Forward - 2 x daily - 7 x weekly - 3 sets - 10-15 reps Sidelying Shoulder External Rotation - 2 x daily - 7 x weekly - 3 sets - 10-15 reps

## 2021-04-20 ENCOUNTER — Encounter: Payer: Self-pay | Admitting: Family Medicine

## 2021-04-27 NOTE — Progress Notes (Addendum)
[facility] [address] [place], [place] [address] [phone]  Date:  05/01/2021   Name:  Susan Roth   DOB:  10-02-1975   MRN:  [id number]  PCP:  Darreld Mclean, MD    Chief Complaint: Breast Pain (1. Pt says she noticed a Lump in the right breast about a month ago. 2. She also c/o of fatigue. 3. Pt has noticed an odor to her urine. 4. She says her L arm is still giving her some issues. )   History of Present Illness:  Susan Roth is a 45 y.o. very pleasant female patient who presents with the following:  Patient here today with a breast concern-she reached out to me regarding this problem last week, stated that she had a right breast nodule which persisted after her menses ended The breast change has been present for about 3 weeks   Her LMP was close to a month ago - should start soon  Do not suspect pregnancy- she is s/p BTL   Most recent mammogram in June which was negative She did have a diagnostic mammogram in 2019 due to her breast mass seen on screening, this turned out to be a cyst   Also she has noted possible urinary sx- she noted an unusual odor of her urine on a couple of occasions over the last week  Some urinary frequency during the night No dysuria or hematuria Also, she notes she has felt more fatigued the last few weeks- she is not aware of any particular cause.  No changes in her routine.  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   Low back pain 12/06/2014   Paresthesia 02/09/2013   IBS (irritable bowel syndrome) 11/13/2012   Admission for sterilization 08/04/2012   Dysmenorrhea 08/04/2012   Obesity 06/25/2012   Urticaria, 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   Anxiety and depression 09/26/2010    ACNE, MILD 09/27/2008   HERPES ZOSTER 09/17/2008    Past Medical History:  Diagnosis Date   Abnormal Pap smear of cervix    cryo   Allergic rhinitis    Allergy    Constipation    is every day- pt off Linzess    Dysfunction of eustachian tube    Endometriosis    GERD (gastroesophageal reflux disease)    occasional   Herpes zoster without mention of complication    History of kidney stones    IBS (irritable bowel syndrome)    Neuropathy    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    Obesity     Past Surgical History:  Procedure Laterality Date   BILATERAL SALPINGECTOMY Right 08/04/2012   Procedure: BILATERAL SALPINGECTOMY;  Surgeon: Emily Filbert, MD;  Location: [facility];  Service: Gynecology;  Laterality: Right;   CESAREAN SECTION  1998   x1   CHOLECYSTECTOMY N/A 01/30/2018   Procedure: LAPAROSCOPIC CHOLECYSTECTOMY;  Surgeon: Greer Pickerel, MD;  Location: [facility];  Service: General;  Laterality: N/A;   COLONOSCOPY     LAPAROSCOPIC TUBAL LIGATION N/A 08/04/2012   Procedure: LAPAROSCOPIC TUBAL LIGATION;  Surgeon: Emily Filbert, MD;  Location: [facility];  Service: Gynecology;  Laterality: N/A;  Operative lsc for right salpingectomy & Mirena insertion- pt had  a  bilat salphingectomy NOT tubal    TONSILLECTOMY     as an adult - 2011   WISDOM TOOTH EXTRACTION      Social History   Tobacco Use   Smoking status: Never   Smokeless tobacco: Never   Tobacco comments:    positive hx of passive tobacco smoke exposure  Vaping Use   Vaping Use: Never used  Substance Use Topics   Alcohol use: Yes    Alcohol/week: 1.0 standard drink    Types: 1 Glasses of wine per week    Comment: occasionally   Drug use: No    Family History  Problem Relation Age of Onset   Hyperlipidemia Mother    Colon polyps Mother    Nephrolithiasis Mother    Diverticulosis Mother    Nephrolithiasis Father    Depression Father        bipolar   Colon cancer Father 87       Lynch   Asthma Sister    Colon  cancer Paternal Aunt 60   Colon cancer Paternal Uncle 46       x 2 uncles   Colon polyps Sister        paternal half sister; Lynch syndrome   Colon cancer Other        double great uncle from both mom and dads side   Asthma Sister    Allergies Sister    Allergies Brother    Clotting disorder Maternal Uncle    Ovarian cancer Maternal Grandmother    Colon cancer Paternal Aunt    Diabetes Paternal Aunt    Esophageal cancer Neg Hx    Rectal cancer Neg Hx    Stomach cancer Neg Hx     Allergies  Allergen Reactions   Amoxicillin Hives   Penicillins Hives    Has patient had a PCN reaction causing immediate rash, facial/tongue/throat swelling, SOB or lightheadedness with hypotension: No Has patient had a PCN reaction causing severe rash involving mucus membranes or skin necrosis: Yes Has patient had a PCN reaction that required hospitalization: No Has patient had a PCN reaction occurring within the last 10 years: No If all of the above answers are "NO", then may proceed with Cephalosporin use.    Wellbutrin [Bupropion Hcl] Hives    Medication list has been reviewed and updated.  Current Outpatient Medications on File Prior to Visit  Medication Sig Dispense Refill   Cholecalciferol (VITAMIN D3) 1.25 MG (50000 UT) CAPS Take 1 weekly for 12 weeks 12 capsule 0   No current facility-administered medications on file prior to visit.    Review of Systems:  As per HPI- otherwise negative.   Physical Examination: Vitals:   05/01/21 1558  BP: 122/70  Pulse: 82  Resp: 18  Temp: 97.9 F (36.6 C)  SpO2: 99%   Vitals:   05/01/21 1558  Weight: 221 lb 12.8 oz (100.6 kg)  Height: 5\' 4"  (1.626 m)   Body mass index is 38.07 kg/m. Ideal Body Weight: Weight in (lb) to have BMI = 25: 145.3  GEN: no acute distress.  Obese, looks well  HEENT: Atraumatic, Normocephalic.  Ears and Nose: No external deformity. CV: RRR, No M/G/R. No JVD. No thrill. No extra heart sounds. PULM: CTA B,  no wheezes, crackles, rhonchi. No retractions. No resp. distress. No accessory muscle use. EXTR: No c/c/e PSYCH: Normally interactive. Conversant.  Bilateral breast exam is normal no masses or abnl noted -except patient notes an area of spot tenderness  at approximately 2:00 right breast  Results for orders placed or performed in visit on 05/01/21  POCT URINALYSIS DIP (CLINITEK)  Result Value Ref Range   Color, UA yellow yellow   Clarity, UA clear clear   Glucose, UA negative negative mg/dL   Bilirubin, UA negative negative   Ketones, POC UA negative negative mg/dL   Spec Grav, UA 1.025 1.010 - 1.025   Blood, UA negative negative   pH, UA 6.0 5.0 - 8.0   POC PROTEIN,UA negative negative, trace   Urobilinogen, UA 0.2 0.2 or 1.0 E.U./dL   Nitrite, UA Negative Negative   Leukocytes, UA Negative Negative    Assessment and Plan: Breast pain, right - Plan: MM Digital Diagnostic Unilat R, US BREAST COMPLETE UNI RIGHT INC AXILLA  Abnormal urine odor - Plan: POCT URINALYSIS DIP (CLINITEK), Urine Culture  Fatigue, unspecified type  Patient seen today with concern of right-sided breast pain-exam is benign, but due to patient's symptoms we will set up a diagnostic mammogram and ultrasound for her ASAP  Urinalysis is reassuring, urine culture pending  Patient notes unspecified fatigue.  Recent lab work-up is reassuring.  Offered to get further labs, recheck CBC today.  Patient declines that she has not had very much liquid today.  She does agree to alert me if symptoms of fatigue or not feeling quite right persist into the new year  Signed Lamar Blinks, MD  Received urine culture 12/19- message to pt  Results for orders placed or performed in visit on 05/01/21  Urine Culture   Specimen: Urine  Result Value Ref Range   MICRO NUMBER: [id number]    SPECIMEN QUALITY: Adequate    Sample Source NOT GIVEN    STATUS: FINAL    Result: No Growth   POCT URINALYSIS DIP (CLINITEK)  Result  Value Ref Range   Color, UA yellow yellow   Clarity, UA clear clear   Glucose, UA negative negative mg/dL   Bilirubin, UA negative negative   Ketones, POC UA negative negative mg/dL   Spec Grav, UA 1.025 1.010 - 1.025   Blood, UA negative negative   pH, UA 6.0 5.0 - 8.0   POC PROTEIN,UA negative negative, trace   Urobilinogen, UA 0.2 0.2 or 1.0 E.U./dL   Nitrite, UA Negative Negative   Leukocytes, UA Negative Negative

## 2021-04-30 ENCOUNTER — Other Ambulatory Visit: Payer: Self-pay | Admitting: Family Medicine

## 2021-04-30 DIAGNOSIS — E559 Vitamin D deficiency, unspecified: Secondary | ICD-10-CM

## 2021-05-01 ENCOUNTER — Encounter: Payer: Self-pay | Admitting: Family Medicine

## 2021-05-01 ENCOUNTER — Ambulatory Visit: Payer: 59 | Admitting: Family Medicine

## 2021-05-01 VITALS — BP 122/70 | HR 82 | Temp 97.9°F | Resp 18 | Ht 64.0 in | Wt 221.8 lb

## 2021-05-01 DIAGNOSIS — R8290 Unspecified abnormal findings in urine: Secondary | ICD-10-CM | POA: Diagnosis not present

## 2021-05-01 DIAGNOSIS — N644 Mastodynia: Secondary | ICD-10-CM | POA: Diagnosis not present

## 2021-05-01 DIAGNOSIS — R5383 Other fatigue: Secondary | ICD-10-CM | POA: Diagnosis not present

## 2021-05-01 LAB — POCT URINALYSIS DIP (CLINITEK)
Bilirubin, UA: NEGATIVE
Blood, UA: NEGATIVE
Glucose, UA: NEGATIVE mg/dL
Ketones, POC UA: NEGATIVE mg/dL
Leukocytes, UA: NEGATIVE
Nitrite, UA: NEGATIVE
POC PROTEIN,UA: NEGATIVE
Spec Grav, UA: 1.025 (ref 1.010–1.025)
Urobilinogen, UA: 0.2 E.U./dL
pH, UA: 6 (ref 5.0–8.0)

## 2021-05-01 NOTE — Telephone Encounter (Signed)
Has an appointment today.

## 2021-05-01 NOTE — Patient Instructions (Signed)
Good to see you today- we will set you up for a diagnostic mammogram and ultrasound of the right breast at the [facility] I will also be in touch with your urine culture  If you continue to feel fatigued and not yourself into the new year please alert me!

## 2021-05-01 NOTE — Telephone Encounter (Signed)
Patient has an appt with you today

## 2021-05-02 LAB — URINE CULTURE
MICRO NUMBER:: 12744511
Result:: NO GROWTH
SPECIMEN QUALITY:: ADEQUATE

## 2021-05-08 ENCOUNTER — Encounter: Payer: Self-pay | Admitting: Family Medicine

## 2021-05-10 ENCOUNTER — Other Ambulatory Visit: Payer: Self-pay | Admitting: Family Medicine

## 2021-05-10 DIAGNOSIS — N644 Mastodynia: Secondary | ICD-10-CM

## 2021-05-24 ENCOUNTER — Encounter: Payer: Self-pay | Admitting: Family Medicine

## 2021-05-24 DIAGNOSIS — D72819 Decreased white blood cell count, unspecified: Secondary | ICD-10-CM

## 2021-05-24 NOTE — Addendum Note (Signed)
Addended by: Lamar Blinks C on: 05/24/2021 08:36 PM   Modules accepted: Orders

## 2021-05-25 ENCOUNTER — Telehealth: Payer: Self-pay

## 2021-05-25 NOTE — Telephone Encounter (Signed)
-----  Message from Gatha Mayer, MD sent at 05/22/2021  8:11 AM EST ----- Regarding: RE: Genetic testing - no show Thanks, Santiago Glad.  I am ccing my new RN Remo Lipps and ms. Ronnald Ramp' PCP  Remo Lipps - let's call her and try to get her in for a visit with me.  I am not sure we could do the labs here, Santiago Glad.  Glendell Docker ----- Message ----- From: Clarene Essex, Counselor Sent: 05/19/2021   6:44 AM EST To: Marlon Pel, RN, Gatha Mayer, MD Subject: RE: Genetic testing - no show                  Good morning,  I came across this patient again in my inbasket, and see that you all have tried to reach out to her.  Please let me know if she comes in for her colonoscopy.  Maybe we can bring a test kit across the street for her, if you all have lab services there, so we can capture her while at your office.  Santiago Glad ----- Message ----- From: Gatha Mayer, MD Sent: 12/26/2018   4:35 PM EST To: Clarene Essex, Counselor Subject: RE: Genetic testing - no show                  Thanks Santiago Glad  I will try to f/u with her also  CEG ----- Message ----- From: Clarene Essex, Counselor Sent: 12/22/2018  10:23 AM EDT To: Gatha Mayer, MD Subject: Genetic testing - no show                      Hi Dr. Carlean Purl,  Ms. Manwarren has no showed and/or cancelled several times, today being the latest.  I would love to get her tested, if we can.  I think I mentioned to you in the past that she is part of a large MLH1 family who also has a PMS2 mutation running in her particular branch.  She is neg for PMS2, but has not been tested for MLH1.  I am not sure what the best plan of action is for her.  We can keep trying.  Just wanted to let you know.  Santiago Glad

## 2021-05-25 NOTE — Telephone Encounter (Signed)
Unable to reach pt or leave voicemail. Message states  Mailbox Full and unable to accept messages at this time

## 2021-05-26 NOTE — Telephone Encounter (Signed)
Unable to reach pt or leave voicemail. Message states  Mailbox Full and unable to accept messages at this time

## 2021-05-29 NOTE — Telephone Encounter (Signed)
Unable to reach pt or leave voicemail. Message states  Mailbox Full and unable to accept messages at this time

## 2021-05-30 NOTE — Telephone Encounter (Signed)
Unable to reach pt or leave voicemail. Message states  Mailbox Full and unable to accept messages at this time

## 2021-06-02 NOTE — Telephone Encounter (Signed)
After Multiple unsuccessful attempts to reach pt by phone a letter was created and sent to pt via My Chart and  Mail requesting pt to call back so we can schedule an Office Visit with Dr. Carlean Purl.

## 2021-06-07 ENCOUNTER — Encounter: Payer: Self-pay | Admitting: General Practice

## 2021-07-31 ENCOUNTER — Encounter (HOSPITAL_BASED_OUTPATIENT_CLINIC_OR_DEPARTMENT_OTHER): Payer: Self-pay

## 2021-07-31 ENCOUNTER — Emergency Department (HOSPITAL_BASED_OUTPATIENT_CLINIC_OR_DEPARTMENT_OTHER)
Admission: EM | Admit: 2021-07-31 | Discharge: 2021-07-31 | Disposition: A | Discharge status: Home / Self Care | Payer: 59 | Attending: Emergency Medicine | Admitting: Emergency Medicine

## 2021-07-31 ENCOUNTER — Emergency Department (HOSPITAL_BASED_OUTPATIENT_CLINIC_OR_DEPARTMENT_OTHER): Payer: 59 | Admitting: Radiology

## 2021-07-31 ENCOUNTER — Other Ambulatory Visit: Payer: Self-pay

## 2021-07-31 DIAGNOSIS — Z87442 Personal history of urinary calculi: Secondary | ICD-10-CM | POA: Insufficient documentation

## 2021-07-31 DIAGNOSIS — R109 Unspecified abdominal pain: Secondary | ICD-10-CM | POA: Diagnosis not present

## 2021-07-31 DIAGNOSIS — R0789 Other chest pain: Secondary | ICD-10-CM | POA: Insufficient documentation

## 2021-07-31 DIAGNOSIS — R1013 Epigastric pain: Secondary | ICD-10-CM

## 2021-07-31 LAB — BASIC METABOLIC PANEL
Anion gap: 11 (ref 5–15)
BUN: 11 mg/dL (ref 6–20)
CO2: 24 mmol/L (ref 22–32)
Calcium: 9.3 mg/dL (ref 8.9–10.3)
Chloride: 100 mmol/L (ref 98–111)
Creatinine, Ser: 0.89 mg/dL (ref 0.44–1.00)
GFR, Estimated: >60 mL/min (ref >60)
Glucose, Bld: 91 mg/dL (ref 70–99)
Potassium: 3.8 mmol/L (ref 3.5–5.1)
Sodium: 135 mmol/L (ref 135–145)

## 2021-07-31 LAB — URINALYSIS, ROUTINE W REFLEX MICROSCOPIC
Bilirubin Urine: NEGATIVE
Glucose, UA: NEGATIVE mg/dL
Hgb urine dipstick: NEGATIVE
Ketones, ur: NEGATIVE mg/dL
Leukocytes,Ua: NEGATIVE
Nitrite: NEGATIVE
Protein, ur: NEGATIVE mg/dL
Specific Gravity, Urine: 1.008 (ref 1.005–1.030)
pH: 6 (ref 5.0–8.0)

## 2021-07-31 LAB — HEPATIC FUNCTION PANEL
ALT: 14 U/L (ref 0–44)
AST: 18 U/L (ref 15–41)
Albumin: 4.3 g/dL (ref 3.5–5.0)
Alkaline Phosphatase: 45 U/L (ref 38–126)
Bilirubin, Direct: 0.2 mg/dL (ref 0.0–0.2)
Indirect Bilirubin: 0.5 mg/dL (ref 0.3–0.9)
Total Bilirubin: 0.7 mg/dL (ref 0.3–1.2)
Total Protein: 7.7 g/dL (ref 6.5–8.1)

## 2021-07-31 LAB — CBC
HCT: 41.3 % (ref 36.0–46.0)
Hemoglobin: 14 g/dL (ref 12.0–15.0)
MCH: 31.1 pg (ref 26.0–34.0)
MCHC: 33.9 g/dL (ref 30.0–36.0)
MCV: 91.8 fL (ref 80.0–100.0)
Platelets: 319 10*3/uL (ref 150–400)
RBC: 4.5 MIL/uL (ref 3.87–5.11)
RDW: 12.1 % (ref 11.5–15.5)
WBC: 5.1 10*3/uL (ref 4.0–10.5)
nRBC: 0 % (ref 0.0–0.2)

## 2021-07-31 LAB — PREGNANCY, URINE: Preg Test, Ur: NEGATIVE

## 2021-07-31 LAB — LIPASE, BLOOD: Lipase: <10 U/L — ABNORMAL LOW (ref 11–51)

## 2021-07-31 LAB — TROPONIN I (HIGH SENSITIVITY): Troponin I (High Sensitivity): <2 ng/L (ref <18)

## 2021-07-31 MED ORDER — ALUM & MAG HYDROXIDE-SIMETH 200-200-20 MG/5ML PO SUSP
30.0000 mL | Freq: Once | ORAL | Status: AC
  Administered 2021-07-31: 30 mL via ORAL
  Filled 2021-07-31: qty 30

## 2021-07-31 MED ORDER — OMEPRAZOLE 20 MG PO CPDR: 20.0000 mg | DELAYED_RELEASE_CAPSULE | Freq: Every day | ORAL | 0 refills | Status: DC

## 2021-07-31 MED ORDER — LIDOCAINE VISCOUS HCL 2 % MT SOLN
15.0000 mL | Freq: Once | OROMUCOSAL | Status: AC
  Administered 2021-07-31: 15 mL via ORAL
  Filled 2021-07-31: qty 15

## 2021-07-31 NOTE — ED Notes (Signed)
Patient transported to XRAY at this Time. 

## 2021-07-31 NOTE — ED Triage Notes (Signed)
Pt presents with Left side chest "fluttering" x1 week, also reports RUQ pain into her Right breast x2 weeks after starting her menstrual cycle. Pt describes her pain as a gas pain.pt also reports waking up 2-3 times in the middle of the night to go to the restroom. Pt denies burning with urination or abnormal odor, endorses frequency with urination  ?

## 2021-07-31 NOTE — ED Provider Notes (Cosign Needed)
[facility] Provider Note   CSN: [id number] Arrival date & time: 07/31/21  1629     History  Chief Complaint  Patient presents with   Chest Pain   Abdominal Pain    Susan Roth is a 46 y.o. female who presents with concern for 2 weeks of epigastric pain that extends to the right upper quadrant, and radiates into the left chest with associated sensation of gas in the chest which you are describes as a "flurry or fluttering".  She denies sensation that her heart is skipping beats or racing.  Denies any true chest pain but endorses chest pressure and denies any shortness of breath.  Denies any nausea or vomiting.  Denies any diarrhea, melena, or hematochezia.  Does endorse urinary frequency and urgency without dysuria.  Personally reviewed this patient's medical records.  She has history of IBS, kidney stones.  She is not currently on any medications daily.  She is not anticoagulated.  She does have a history of GERD which is not currently treated.  No recent prolonged travel or immobilization.  No history of DVT or surgeries, no history of malignancy.  HPI     Home Medications Prior to Admission medications   Medication Sig Start Date End Date Taking? Authorizing Provider  Cholecalciferol (VITAMIN D3) 1.25 MG (50000 UT) CAPS Take 1 weekly for 12 weeks 02/07/21   Copland, Gay Filler, MD      Allergies    Amoxicillin, Penicillins, and Wellbutrin [bupropion hcl]    Review of Systems   Review of Systems  Constitutional: Negative.   HENT: Negative.    Eyes: Negative.   Respiratory: Negative.    Cardiovascular:  Positive for chest pain. Negative for palpitations and leg swelling.  Gastrointestinal:  Positive for abdominal pain. Negative for blood in stool, diarrhea, nausea and vomiting.  Genitourinary:  Positive for frequency.  Musculoskeletal: Negative.   Skin: Negative.   Neurological: Negative.    Physical Exam Updated Vital Signs BP (!)  147/90 (BP Location: Right Arm)    Pulse 92    Temp 98.8 F (37.1 C)    Resp 18    SpO2 100%  Physical Exam Vitals and nursing note reviewed.  Constitutional:      Appearance: She is obese. She is not ill-appearing or toxic-appearing.  HENT:     Head: Normocephalic and atraumatic.     Nose: Nose normal.     Mouth/Throat:     Mouth: Mucous membranes are moist.     Pharynx: Oropharynx is clear. Uvula midline. No oropharyngeal exudate or posterior oropharyngeal erythema.     Tonsils: No tonsillar exudate.  Eyes:     General: Lids are normal. Vision grossly intact.        Right eye: No discharge.        Left eye: No discharge.     Extraocular Movements: Extraocular movements intact.     Conjunctiva/sclera: Conjunctivae normal.     Pupils: Pupils are equal, round, and reactive to light.  Neck:     Trachea: Trachea and phonation normal.  Cardiovascular:     Rate and Rhythm: Normal rate and regular rhythm.     Pulses: Normal pulses.     Heart sounds: Normal heart sounds. No murmur heard. Pulmonary:     Effort: Pulmonary effort is normal. No tachypnea, bradypnea, accessory muscle usage, prolonged expiration or respiratory distress.     Breath sounds: Normal breath sounds. No wheezing, rhonchi or rales.  Chest:  Chest wall: No mass, lacerations, deformity, swelling, tenderness, crepitus or edema.  Abdominal:     General: Bowel sounds are normal. There is no distension.     Palpations: Abdomen is soft.     Tenderness: There is no abdominal tenderness. There is no right CVA tenderness, left CVA tenderness, guarding or rebound.  Musculoskeletal:        General: No deformity. Normal range of motion.     Cervical back: Normal range of motion and neck supple.     Right lower leg: No edema.     Left lower leg: No edema.  Lymphadenopathy:     Cervical: No cervical adenopathy.  Skin:    General: Skin is warm and dry.     Capillary Refill: Capillary refill takes less than 2 seconds.   Neurological:     General: No focal deficit present.     Mental Status: She is alert and oriented to person, place, and time. Mental status is at baseline.  Psychiatric:        Mood and Affect: Mood normal.    ED Results / Procedures / Treatments   Labs (all labs ordered are listed, but only abnormal results are displayed) Labs Reviewed  BASIC METABOLIC PANEL  CBC  PREGNANCY, URINE  TROPONIN I (HIGH SENSITIVITY)    EKG EKG Interpretation  Date/Time:  Monday July 31 2021 16:40:49 EDT Ventricular Rate:  97 PR Interval:  164 QRS Duration: 76 QT Interval:  340 QTC Calculation: 431 R Axis:   33 Text Interpretation: Normal sinus rhythm Nonspecific T wave abnormality Abnormal ECG No previous ECGs available Confirmed by Sherwood Gambler [phone]) on 07/31/2021 5:18:21 PM  Radiology DG Chest 2 View  Result Date: 07/31/2021 CLINICAL DATA:  Chest pain. EXAM: CHEST - 2 VIEW COMPARISON:  October 23, 2017. FINDINGS: No consolidation. No visible pleural effusions or pneumothorax. Cardiomediastinal silhouette is within normal limits and similar to prior. IMPRESSION: No evidence of acute cardiopulmonary disease. Electronically Signed   By: Margaretha Sheffield M.D.   On: 07/31/2021 17:06    Procedures Procedures    Medications Ordered in ED Medications - No data to display  ED Course/ Medical Decision Making/ A&P                           Medical Decision Making 46 year old female presents with concern for 2 weeks of epigastric pain, sensation of gas, and radiation of this discomfort to the left chest.  LMP 2 weeks ago.  Hypertensive on intake vital signs are normal.  Cardiopulmonary and abdominal exams are benign.  Patient is neurovascular intact in all 4 extremities with normal vital signs.  Differential diagnosis includes was limited to GERD, pancreatitis, pneumonia, PE, dysrhythmia, ACS.  Patient is PERC negative.  Amount and/or Complexity of Data Reviewed Labs: ordered.    Details:  CBC without leukocytosis or anemia.  BMP unremarkable.  Hepatic function tests and lipase are normal.  Troponin is negative, less than 2.  UA without signs of infection.  Pregnancy test is negative. Radiology: ordered.    Details: Chest x-ray negative for acute cardiopulmonary disease.  Images visualized by this provider. ECG/medicine tests: independent interpretation performed.    Details: EKG also is reassuring with normal sinus rhythm.  Risk OTC drugs. Prescription drug management.   Reevaluated after GI cocktail with improvement in her symptoms.  Clinical concern for emergent underlying etiology that would warrant further ED work-up or inpatient management is exceedingly low.  Doubt ACS given normal EKG and troponins, also doubt arrhythmia.  PERC negative.  Suspect GERD/gastritis.  Will discharge with PPI prescription and recommend outpatient follow-up.  Susan Roth voiced understanding of her medical evaluation and treatment plan.  Each of her questions was answered to her expressed satisfaction.  Strict return precautions given.  Patient is well-appearing, stable, and was discharged in good condition.  This chart was dictated using voice recognition software, Dragon. Despite the best efforts of this provider to proofread and correct errors, errors may still occur which can change documentation meaning.    Final Clinical Impression(s) / ED Diagnoses Final diagnoses:  None    Rx / DC Orders ED Discharge Orders     None         Emeline Darling, PA-C 07/31/21 2035

## 2021-07-31 NOTE — Discharge Instructions (Addendum)
You are seen in the ER today for your epigastric pain.  Your blood work was very reassuring.  Suspect you have a bit of heartburn or gastritis which is inflammation of the stomach itself.  You been prescribed medication to take daily for the next month.  Please take as prescribed for the entire course and return to the ER with any new severe symptoms.  Follow-up with your primary care doctor. ?

## 2021-07-31 NOTE — ED Notes (Signed)
RN provided AVS using Teachback Method. Patient verbalizes understanding of Discharge Instructions. Opportunity for Questioning and Answers were provided by RN. Patient Discharged from ED ambulatory to Home via Self.  

## 2021-07-31 NOTE — ED Notes (Signed)
PA at the Bedside. ?

## 2021-07-31 NOTE — ED Notes (Signed)
Patient returned from XRAY 

## 2021-08-03 ENCOUNTER — Encounter: Payer: Self-pay | Admitting: Family Medicine

## 2021-08-03 ENCOUNTER — Other Ambulatory Visit: Payer: Self-pay

## 2021-08-03 ENCOUNTER — Ambulatory Visit: Payer: 59 | Admitting: Family Medicine

## 2021-08-03 VITALS — BP 93/71 | Ht 64.0 in | Wt 217.0 lb

## 2021-08-03 DIAGNOSIS — D259 Leiomyoma of uterus, unspecified: Secondary | ICD-10-CM

## 2021-08-03 DIAGNOSIS — N939 Abnormal uterine and vaginal bleeding, unspecified: Secondary | ICD-10-CM | POA: Diagnosis not present

## 2021-08-03 NOTE — Progress Notes (Signed)
? ?  Subjective:  ? ? Patient ID: Susan Roth, female    DOB: 1976/03/18, 46 y.o.   MRN: [id number] ? ?HPI ? ?46 year old G1 P0-1-0-1 who presents with slight shortening of her periods over the past few months.  Her.  She is to be 28 days in length approximately 7 days long.  Now she has.  Every 24 days with 6 days of menstrual bleeding.  Does have some cramping and discomfort.  She does have a history of endometrioma.  She had a salpingectomy with lysis of adhesions in 2014 which improved her pain.  She had an ultrasound in 2017 that showed a 1.5 cm subserosal fibroid. ? ?Review of Systems ? ?   ?Objective:  ? Physical Exam ?Vitals reviewed.  ?Constitutional:   ?   Appearance: Normal appearance.  ?Pulmonary:  ?   Effort: Pulmonary effort is normal.  ?Abdominal:  ?   General: Abdomen is flat.  ?   Palpations: Abdomen is soft.  ?Neurological:  ?   General: No focal deficit present.  ?   Mental Status: She is alert.  ?Psychiatric:     ?   Mood and Affect: Mood normal.     ?   Behavior: Behavior normal.     ?   Thought Content: Thought content normal.     ?   Judgment: Judgment normal.  ? ?   ?Assessment & Plan:  ?1. Abnormal uterine bleeding (AUB) ?Discussed options with the patient including treatment with contraception, IUD, surgery.  We will check ultrasound to see if the fibroid is larger or if she has additional fibroids, specifically submucosal fibroids ?- US PELVIS TRANSVAGINAL NON-OB (TV ONLY); Future ? ?2. Uterine leiomyoma, unspecified location ? ? ?

## 2021-08-03 NOTE — Progress Notes (Signed)
Patient states that her period is coming every 24 days. Patient reports increase in pain with her periods. No clots. Kathrene Alu RN  ?

## 2021-08-09 ENCOUNTER — Other Ambulatory Visit: Payer: Self-pay | Admitting: Family Medicine

## 2021-08-09 ENCOUNTER — Other Ambulatory Visit: Payer: Self-pay

## 2021-08-09 ENCOUNTER — Ambulatory Visit (HOSPITAL_BASED_OUTPATIENT_CLINIC_OR_DEPARTMENT_OTHER)
Admission: RE | Admit: 2021-08-09 | Discharge: 2021-08-09 | Disposition: A | Discharge status: Home / Self Care | Payer: 59 | Source: Ambulatory Visit | Attending: Family Medicine | Admitting: Family Medicine

## 2021-08-09 DIAGNOSIS — N939 Abnormal uterine and vaginal bleeding, unspecified: Secondary | ICD-10-CM

## 2021-10-13 ENCOUNTER — Encounter (HOSPITAL_BASED_OUTPATIENT_CLINIC_OR_DEPARTMENT_OTHER): Payer: Self-pay

## 2021-10-13 ENCOUNTER — Other Ambulatory Visit: Payer: Self-pay

## 2021-10-13 ENCOUNTER — Emergency Department (HOSPITAL_BASED_OUTPATIENT_CLINIC_OR_DEPARTMENT_OTHER)
Admission: EM | Admit: 2021-10-13 | Discharge: 2021-10-13 | Disposition: A | Discharge status: Home / Self Care | Payer: 59 | Attending: Emergency Medicine | Admitting: Emergency Medicine

## 2021-10-13 ENCOUNTER — Emergency Department (HOSPITAL_BASED_OUTPATIENT_CLINIC_OR_DEPARTMENT_OTHER): Payer: 59 | Admitting: Radiology

## 2021-10-13 DIAGNOSIS — R112 Nausea with vomiting, unspecified: Secondary | ICD-10-CM | POA: Insufficient documentation

## 2021-10-13 DIAGNOSIS — R0789 Other chest pain: Secondary | ICD-10-CM | POA: Insufficient documentation

## 2021-10-13 DIAGNOSIS — R079 Chest pain, unspecified: Secondary | ICD-10-CM | POA: Diagnosis present

## 2021-10-13 DIAGNOSIS — R519 Headache, unspecified: Secondary | ICD-10-CM | POA: Diagnosis not present

## 2021-10-13 DIAGNOSIS — R630 Anorexia: Secondary | ICD-10-CM | POA: Insufficient documentation

## 2021-10-13 DIAGNOSIS — J3489 Other specified disorders of nose and nasal sinuses: Secondary | ICD-10-CM | POA: Insufficient documentation

## 2021-10-13 LAB — COMPREHENSIVE METABOLIC PANEL
ALT: 11 U/L (ref 0–44)
AST: 13 U/L — ABNORMAL LOW (ref 15–41)
Albumin: 4.2 g/dL (ref 3.5–5.0)
Alkaline Phosphatase: 62 U/L (ref 38–126)
Anion gap: 9 (ref 5–15)
BUN: 12 mg/dL (ref 6–20)
CO2: 24 mmol/L (ref 22–32)
Calcium: 8.7 mg/dL — ABNORMAL LOW (ref 8.9–10.3)
Chloride: 103 mmol/L (ref 98–111)
Creatinine, Ser: 0.77 mg/dL (ref 0.44–1.00)
GFR, Estimated: >60 mL/min (ref >60)
Glucose, Bld: 92 mg/dL (ref 70–99)
Potassium: 3.8 mmol/L (ref 3.5–5.1)
Sodium: 136 mmol/L (ref 135–145)
Total Bilirubin: 0.6 mg/dL (ref 0.3–1.2)
Total Protein: 7.6 g/dL (ref 6.5–8.1)

## 2021-10-13 LAB — CBC
HCT: 42.6 % (ref 36.0–46.0)
Hemoglobin: 14.6 g/dL (ref 12.0–15.0)
MCH: 31.4 pg (ref 26.0–34.0)
MCHC: 34.3 g/dL (ref 30.0–36.0)
MCV: 91.6 fL (ref 80.0–100.0)
Platelets: 301 10*3/uL (ref 150–400)
RBC: 4.65 MIL/uL (ref 3.87–5.11)
RDW: 11.9 % (ref 11.5–15.5)
WBC: 5.8 10*3/uL (ref 4.0–10.5)
nRBC: 0 % (ref 0.0–0.2)

## 2021-10-13 LAB — LIPASE, BLOOD: Lipase: <10 U/L — ABNORMAL LOW (ref 11–51)

## 2021-10-13 LAB — TROPONIN I (HIGH SENSITIVITY)
Troponin I (High Sensitivity): <2 ng/L (ref <18)
Troponin I (High Sensitivity): <2 ng/L (ref <18)

## 2021-10-13 LAB — PREGNANCY, URINE: Preg Test, Ur: NEGATIVE

## 2021-10-13 MED ORDER — OMEPRAZOLE 20 MG PO CPDR: 20.0000 mg | DELAYED_RELEASE_CAPSULE | Freq: Every day | ORAL | 1 refills | Status: DC

## 2021-10-13 MED ORDER — LIDOCAINE VISCOUS HCL 2 % MT SOLN
15.0000 mL | Freq: Once | OROMUCOSAL | Status: AC
  Administered 2021-10-13: 15 mL via ORAL
  Filled 2021-10-13: qty 15

## 2021-10-13 MED ORDER — PANTOPRAZOLE SODIUM 40 MG IV SOLR
40.0000 mg | Freq: Once | INTRAVENOUS | Status: AC
  Administered 2021-10-13: 40 mg via INTRAVENOUS
  Filled 2021-10-13: qty 10

## 2021-10-13 MED ORDER — ONDANSETRON 4 MG PO TBDP: 4.0000 mg | ORAL_TABLET | ORAL | 0 refills | Status: DC | PRN

## 2021-10-13 MED ORDER — ALUM & MAG HYDROXIDE-SIMETH 200-200-20 MG/5ML PO SUSP
30.0000 mL | Freq: Once | ORAL | Status: AC
Start: 2021-10-13 — End: 2021-10-13
  Administered 2021-10-13: 30 mL via ORAL
  Filled 2021-10-13: qty 30

## 2021-10-13 NOTE — ED Triage Notes (Addendum)
Pt reports emesis of dark coffee ground appearance that started today. Pt has associated RUQ pain. Pt also states a couple weeks ago she had a dark tarry BM. No active vomiting noted during triage.

## 2021-10-13 NOTE — ED Provider Notes (Signed)
[facility] EMERGENCY DEPT Provider Note   CSN: [id number] Arrival date & time: 10/13/21  1609     History  Chief Complaint  Patient presents with   GI Bleeding    Susan Roth is a 46 y.o. female.  HPI Patient reports that for the past several days she has been getting discomfort in her central chest slightly to the right side.  She reports it is an achy feeling.  Is been uncomfortable for a few days at least.  Patient reports that she also seems of lost her appetite.  She reports she did not really want anything to eat yesterday she did not eat today.  She went to work and ended up vomiting once.  She reports she vomited a large amount of material and it looked fairly dark.  She reports about 2 weeks ago his stool that looked dark.  Patient does not have any known history of peptic ulcer disease or EGD diagnosed gastritis.  She does report that a few months ago she was treated with some reflux medications for a limited amount of time.  She reports she took some of them but did not continue it.  Patient has had a cholecystectomy.  She denies any pain burning urgency with urination.  She reports she does have problems with some sinus pressure and headaches.  She has also been noticing crackling in her right ear.  She reports she has taken several doses of Aleve over the past couple of days.  Reports typically she does not take it with regularity but if she gets a headache she might take several doses over the course of a few days.    Home Medications Prior to Admission medications   Medication Sig Start Date End Date Taking? Authorizing Provider  omeprazole (PRILOSEC) 20 MG capsule Take 1 capsule (20 mg total) by mouth daily. 10/13/21  Yes Charlesetta Shanks, MD  ondansetron (ZOFRAN-ODT) 4 MG disintegrating tablet Take 1 tablet (4 mg total) by mouth every 4 (four) hours as needed for nausea or vomiting. 10/13/21  Yes Charlesetta Shanks, MD  Cholecalciferol (VITAMIN D3) 1.25 MG  (50000 UT) CAPS Take 1 weekly for 12 weeks 02/07/21   Copland, Gay Filler, MD  omeprazole (PRILOSEC) 20 MG capsule Take 1 capsule (20 mg total) by mouth daily. 07/31/21 08/30/21  Sponseller, Gypsy Balsam, PA-C      Allergies    Amoxicillin, Penicillins, and Wellbutrin [bupropion hcl]    Review of Systems   Review of Systems 10 systems reviewed negative except as per HPI Physical Exam Updated Vital Signs BP 99/66   Pulse 82   Temp 99.3 F (37.4 C)   Resp 19   Ht '5\' 4"'$  (1.626 m)   Wt 99.8 kg   LMP 10/03/2021   SpO2 98%   BMI 37.76 kg/m  Physical Exam Constitutional:      Comments: Alert nontoxic clinically well in appearance.  No respiratory distress.  HENT:     Right Ear: Tympanic membrane normal.     Left Ear: Tympanic membrane normal.     Mouth/Throat:     Pharynx: Oropharynx is clear.  Eyes:     Extraocular Movements: Extraocular movements intact.  Cardiovascular:     Rate and Rhythm: Normal rate and regular rhythm.  Pulmonary:     Effort: Pulmonary effort is normal.     Breath sounds: Normal breath sounds.     Comments: Patient does have some focal reproducible chest wall pain on the right parasternal costal  margins about mid chest Abdominal:     General: There is no distension.     Palpations: Abdomen is soft.     Tenderness: There is no abdominal tenderness. There is no guarding.  Musculoskeletal:        General: No swelling or tenderness. Normal range of motion.     Right lower leg: No edema.     Left lower leg: No edema.  Skin:    General: Skin is warm and dry.  Neurological:     General: No focal deficit present.     Mental Status: She is oriented to person, place, and time.     Motor: No weakness.     Coordination: Coordination normal.  Psychiatric:        Mood and Affect: Mood normal.    ED Results / Procedures / Treatments   Labs (all labs ordered are listed, but only abnormal results are displayed) Labs Reviewed  COMPREHENSIVE METABOLIC PANEL -  Abnormal; Notable for the following components:      Result Value   Calcium 8.7 (*)    AST 13 (*)    All other components within normal limits  LIPASE, BLOOD - Abnormal; Notable for the following components:   Lipase <10 (*)    All other components within normal limits  CBC  PREGNANCY, URINE  TROPONIN I (HIGH SENSITIVITY)  TROPONIN I (HIGH SENSITIVITY)    EKG EKG Interpretation  Date/Time:  Friday Oct 13 2021 19:01:36 EDT Ventricular Rate:  82 PR Interval:  174 QRS Duration: 89 QT Interval:  356 QTC Calculation: 416 R Axis:   56 Text Interpretation: Sinus rhythm normal, no change from previous Confirmed by Charlesetta Shanks [phone]) on 10/13/2021 11:26:30 PM  Radiology DG Chest 2 View  Result Date: 10/13/2021 CLINICAL DATA:  Chest pain EXAM: CHEST - 2 VIEW COMPARISON:  07/31/2021 FINDINGS: The heart size and mediastinal contours are within normal limits. Both lungs are clear. The visualized skeletal structures are unremarkable. IMPRESSION: No active cardiopulmonary disease. Electronically Signed   By: Rolm Baptise M.D.   On: 10/13/2021 19:22    Procedures Procedures    Medications Ordered in ED Medications  pantoprazole (PROTONIX) injection 40 mg (40 mg Intravenous Given 10/13/21 1909)  alum & mag hydroxide-simeth (MAALOX/MYLANTA) 200-200-20 MG/5ML suspension 30 mL (30 mLs Oral Given 10/13/21 1909)    And  lidocaine (XYLOCAINE) 2 % viscous mouth solution 15 mL (15 mLs Oral Given 10/13/21 1909)    ED Course/ Medical Decision Making/ A&P                           Medical Decision Making Amount and/or Complexity of Data Reviewed Labs: ordered. Radiology: ordered.  Risk OTC drugs. Prescription drug management.  Patient reports a slightly right-sided parasternal chest pain for several days.  She does describe an episode of dark emesis today.  Clinically patient is well in appearance.  She has limited risk factors for peptic ulcer disease.  Patient has taken several doses of  Aleve over the past few days although does not sound excessive.  We will proceed with diagnostic evaluation for GI bleed as well as rule out ACS\pneumonia\musculoskeletal chest pain.  Will give patient a trial of Protonix and GI cocktail to see if this improves symptoms.  Diagnostic evaluation shows normal renal function and LFTs.  No anemia.  Hemoglobin is at 14.6.  No leukocytosis.  No elevation in troponin.  No elevation in lipase.  This  time with epigastric pain and some right sided central chest pain I have high suspicion for gastritis versus GERD.  Patient has been taking a few extra doses of Aleve over the past couple of days.  No evidence of anemia.  Vital signs are stable.  I do not suspect significant GI bleed.  Patient was treated with Protonix and Zofran with improvement.  At this time plan will be for daily omeprazole, Zofran if needed and close follow-up with PCP.  Patient is aware she may need further diagnostic evaluation including EGD.  Return precautions reviewed. .        Final Clinical Impression(s) / ED Diagnoses Final diagnoses:  Nausea and vomiting, unspecified vomiting type  Chest pain, unspecified type  Anorexia    Rx / DC Orders ED Discharge Orders          Ordered    omeprazole (PRILOSEC) 20 MG capsule  Daily        10/13/21 2321    ondansetron (ZOFRAN-ODT) 4 MG disintegrating tablet  Every 4 hours PRN        10/13/21 2321              Charlesetta Shanks, MD 10/13/21 2329

## 2021-10-13 NOTE — ED Notes (Signed)
Attempted to obtain blood from pt with no success.

## 2021-10-13 NOTE — ED Notes (Signed)
Patient transported to X-ray 

## 2021-10-13 NOTE — Discharge Instructions (Signed)
1.  At this time I suspect you have gastroesophageal reflux disease and/or gastritis.  Gastritis is inflammation of the stomach lining.  Follow instructions regarding gastroesophageal reflux disease in your discharge instructions.  Omeprazole once daily in the morning as prescribed. 2.  You will need a recheck with your doctor to see if you are improving with this treatment.  You may need further testing such as an endoscopy.  This is where a lighted camera is used to look at the lining of your esophagus and stomach.  Other testing may be needed if your symptoms are persisting or worsening 3.  Return to the emergency department immediately if you have new worsening or concerning symptoms.

## 2021-10-13 NOTE — ED Notes (Signed)
Pt back in room from radiology.

## 2021-10-19 ENCOUNTER — Encounter: Payer: Self-pay | Admitting: Family Medicine

## 2021-10-23 NOTE — Progress Notes (Unsigned)
[facility] [address], [place], [place] [address] [phone]  Date:  10/25/2021   Name:  Susan Roth   DOB:  09/18/1975   MRN:  [id number]  PCP:  Darreld Mclean, MD    Chief Complaint: No chief complaint on file.   History of Present Illness:  Susan Roth is a 46 y.o. 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   Low back pain 12/06/2014   Paresthesia 02/09/2013   IBS (irritable bowel syndrome) 11/13/2012   Admission for sterilization 08/04/2012   Dysmenorrhea 08/04/2012   Obesity 06/25/2012   Urticaria, 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   Anxiety and depression 09/26/2010   ACNE, MILD 09/27/2008   HERPES ZOSTER 09/17/2008    Past Medical History:  Diagnosis Date   Abnormal Pap smear of cervix    cryo   Allergic rhinitis    Allergy    Constipation    is every day- pt off Linzess    Dysfunction of eustachian tube    Endometriosis    GERD (gastroesophageal reflux disease)    occasional   Herpes zoster without mention of complication    History of kidney stones    IBS (irritable bowel syndrome)    Neuropathy    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    Obesity     Past Surgical History:  Procedure Laterality Date   BILATERAL SALPINGECTOMY Right 08/04/2012   Procedure: BILATERAL SALPINGECTOMY;  Surgeon: Emily Filbert, MD;  Location: [facility];  Service: Gynecology;  Laterality: Right;   CESAREAN SECTION  1998   x1   CHOLECYSTECTOMY N/A 01/30/2018    Procedure: LAPAROSCOPIC CHOLECYSTECTOMY;  Surgeon: Greer Pickerel, MD;  Location: [facility];  Service: General;  Laterality: N/A;   COLONOSCOPY     LAPAROSCOPIC TUBAL LIGATION N/A 08/04/2012   Procedure: LAPAROSCOPIC TUBAL LIGATION;  Surgeon: Emily Filbert, MD;  Location: [facility];  Service: Gynecology;  Laterality: N/A;  Operative lsc for right salpingectomy & Mirena insertion- pt had a  bilat salphingectomy NOT tubal    TONSILLECTOMY     as an adult - 2011   WISDOM TOOTH EXTRACTION      Social History   Tobacco Use   Smoking status: Never   Smokeless tobacco: Never   Tobacco comments:    positive hx of passive tobacco smoke exposure  Vaping Use   Vaping Use: Never used  Substance Use Topics   Alcohol use: Never    Alcohol/week: 1.0 standard drink    Types: 1 Glasses of wine per week    Comment: occasionally   Drug use: No    Family History  Problem Relation Age of Onset   Hyperlipidemia Mother    Colon polyps Mother    Nephrolithiasis Mother    Diverticulosis Mother    Nephrolithiasis Father    Depression Father        bipolar   Colon cancer Father 79  Lynch   Asthma Sister    Colon cancer Paternal Aunt 8   Colon cancer Paternal Uncle 23       x 2 uncles   Colon polyps Sister        paternal half sister; Lynch syndrome   Colon cancer Other        double great uncle from both mom and dads side   Asthma Sister    Allergies Sister    Allergies Brother    Clotting disorder Maternal Uncle    Ovarian cancer Maternal Grandmother    Colon cancer Paternal Aunt    Diabetes Paternal Aunt    Esophageal cancer Neg Hx    Rectal cancer Neg Hx    Stomach cancer Neg Hx     Allergies  Allergen Reactions   Amoxicillin Hives   Penicillins Hives    Has patient had a PCN reaction causing immediate rash, facial/tongue/throat swelling, SOB or lightheadedness with hypotension: No Has patient had a PCN reaction causing severe rash involving mucus membranes or skin necrosis:  Yes Has patient had a PCN reaction that required hospitalization: No Has patient had a PCN reaction occurring within the last 10 years: No If all of the above answers are "NO", then may proceed with Cephalosporin use.    Wellbutrin [Bupropion Hcl] Hives    Medication list has been reviewed and updated.  Current Outpatient Medications on File Prior to Visit  Medication Sig Dispense Refill   Cholecalciferol (VITAMIN D3) 1.25 MG (50000 UT) CAPS Take 1 weekly for 12 weeks 12 capsule 0   omeprazole (PRILOSEC) 20 MG capsule Take 1 capsule (20 mg total) by mouth daily. 30 capsule 0   omeprazole (PRILOSEC) 20 MG capsule Take 1 capsule (20 mg total) by mouth daily. 30 capsule 1   ondansetron (ZOFRAN-ODT) 4 MG disintegrating tablet Take 1 tablet (4 mg total) by mouth every 4 (four) hours as needed for nausea or vomiting. 20 tablet 0   No current facility-administered medications on file prior to visit.    Review of Systems:  As per HPI- otherwise negative. Marland Kitchenp  Physical Examination: There were no vitals filed for this visit. There were no vitals filed for this visit. There is no height or weight on file to calculate BMI. Ideal Body Weight:    Ful   Assessment and Plan: ***  Signed Lamar Blinks, MD

## 2021-10-25 ENCOUNTER — Ambulatory Visit: Payer: 59 | Admitting: Family Medicine

## 2021-10-25 VITALS — BP 110/60 | HR 76 | Temp 97.6°F | Resp 18 | Ht 64.0 in | Wt 216.6 lb

## 2021-10-25 DIAGNOSIS — Z09 Encounter for follow-up examination after completed treatment for conditions other than malignant neoplasm: Secondary | ICD-10-CM | POA: Diagnosis not present

## 2021-10-25 DIAGNOSIS — Z1509 Genetic susceptibility to other malignant neoplasm: Secondary | ICD-10-CM

## 2021-10-25 DIAGNOSIS — Z1211 Encounter for screening for malignant neoplasm of colon: Secondary | ICD-10-CM | POA: Diagnosis not present

## 2021-10-25 DIAGNOSIS — Z23 Encounter for immunization: Secondary | ICD-10-CM | POA: Diagnosis not present

## 2021-10-25 NOTE — Patient Instructions (Addendum)
It was good to see you today- I am glad you are feeling better Please see me for a pap asap - we can recheck your calcium level at that time  I will get in touch with Dr Carlean Purl for you and have him bring you in for screening Tetanus today

## 2021-11-06 ENCOUNTER — Encounter: Payer: Self-pay | Admitting: Family Medicine

## 2021-11-07 ENCOUNTER — Encounter: Payer: Self-pay | Admitting: Family Medicine

## 2021-11-08 ENCOUNTER — Encounter: Payer: Self-pay | Admitting: Family Medicine

## 2021-11-08 ENCOUNTER — Other Ambulatory Visit: Payer: Self-pay | Admitting: Family Medicine

## 2021-11-08 ENCOUNTER — Telehealth: Payer: Self-pay | Admitting: Genetic Counselor

## 2021-11-08 DIAGNOSIS — Z800 Family history of malignant neoplasm of digestive organs: Secondary | ICD-10-CM

## 2021-11-08 NOTE — Telephone Encounter (Signed)
Scheduled appt per 6/20 referral. Pt is aware of appt date and time. Pt is aware to arrive 15 mins prior to appt time and to bring and updated insurance card. Pt is aware of appt location.   

## 2021-11-21 NOTE — Progress Notes (Unsigned)
[facility] [address], [place], [place] [address] [phone]  Date:  11/27/2021   Name:  Susan Roth   DOB:  May 31, 1975   MRN:  [id number]  PCP:  Darreld Mclean, MD    Chief Complaint: No chief complaint on file.   History of Present Illness:  Susan Roth is a 46 y.o. 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   Low back pain 12/06/2014   Paresthesia 02/09/2013   IBS (irritable bowel syndrome) 11/13/2012   Admission for sterilization 08/04/2012   Dysmenorrhea 08/04/2012   Obesity 06/25/2012   Urticaria, 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   Anxiety and depression 09/26/2010   ACNE, MILD 09/27/2008   HERPES ZOSTER 09/17/2008    Past Medical History:  Diagnosis Date   Abnormal Pap smear of cervix    cryo   Allergic rhinitis    Allergy    Constipation    is every day- pt off Linzess    Dysfunction of eustachian tube    Endometriosis    GERD (gastroesophageal reflux disease)    occasional   Herpes zoster without mention of complication    History of kidney stones    IBS (irritable bowel syndrome)    Neuropathy    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    Obesity     Past Surgical History:  Procedure Laterality Date   BILATERAL SALPINGECTOMY Right 08/04/2012   Procedure: BILATERAL SALPINGECTOMY;  Surgeon: Emily Filbert, MD;  Location: [facility];  Service: Gynecology;  Laterality: Right;   CESAREAN SECTION  1998   x1    CHOLECYSTECTOMY N/A 01/30/2018   Procedure: LAPAROSCOPIC CHOLECYSTECTOMY;  Surgeon: Greer Pickerel, MD;  Location: [facility];  Service: General;  Laterality: N/A;   COLONOSCOPY     LAPAROSCOPIC TUBAL LIGATION N/A 08/04/2012   Procedure: LAPAROSCOPIC TUBAL LIGATION;  Surgeon: Emily Filbert, MD;  Location: [facility];  Service: Gynecology;  Laterality: N/A;  Operative lsc for right salpingectomy & Mirena insertion- pt had a  bilat salphingectomy NOT tubal    TONSILLECTOMY     as an adult - 2011   WISDOM TOOTH EXTRACTION      Social History   Tobacco Use   Smoking status: Never   Smokeless tobacco: Never   Tobacco comments:    positive hx of passive tobacco smoke exposure  Vaping Use   Vaping Use: Never used  Substance Use Topics   Alcohol use: Never    Alcohol/week: 1.0 standard drink of alcohol    Types: 1 Glasses of wine per week    Comment: occasionally   Drug use: No    Family History  Problem Relation Age of Onset   Hyperlipidemia Mother    Colon polyps Mother    Nephrolithiasis Mother    Diverticulosis Mother    Nephrolithiasis Father    Depression Father  bipolar   Colon cancer Father 31       Lynch   Asthma Sister    Colon cancer Paternal Aunt 67   Colon cancer Paternal Uncle 19       x 2 uncles   Colon polyps Sister        paternal half sister; Lynch syndrome   Colon cancer Other        double great uncle from both mom and dads side   Asthma Sister    Allergies Sister    Allergies Brother    Clotting disorder Maternal Uncle    Ovarian cancer Maternal Grandmother    Colon cancer Paternal Aunt    Diabetes Paternal Aunt    Esophageal cancer Neg Hx    Rectal cancer Neg Hx    Stomach cancer Neg Hx     Allergies  Allergen Reactions   Amoxicillin Hives   Penicillins Hives    Has patient had a PCN reaction causing immediate rash, facial/tongue/throat swelling, SOB or lightheadedness with hypotension: No Has patient had a PCN reaction causing severe rash  involving mucus membranes or skin necrosis: Yes Has patient had a PCN reaction that required hospitalization: No Has patient had a PCN reaction occurring within the last 10 years: No If all of the above answers are "NO", then may proceed with Cephalosporin use.    Wellbutrin [Bupropion Hcl] Hives    Medication list has been reviewed and updated.  Current Outpatient Medications on File Prior to Visit  Medication Sig Dispense Refill   omeprazole (PRILOSEC) 20 MG capsule Take 1 capsule (20 mg total) by mouth daily. 30 capsule 1   ondansetron (ZOFRAN-ODT) 4 MG disintegrating tablet Take 1 tablet (4 mg total) by mouth every 4 (four) hours as needed for nausea or vomiting. 20 tablet 0   OZEMPIC, 0.25 OR 0.5 MG/DOSE, 2 MG/3ML SOPN Inject into the skin.     No current facility-administered medications on file prior to visit.    Review of Systems:  As per HPI- otherwise negative.   Physical Examination: There were no vitals filed for this visit. There were no vitals filed for this visit. There is no height or weight on file to calculate BMI. Ideal Body Weight:    GEN: no acute distress. HEENT: Atraumatic, Normocephalic.  Ears and Nose: No external deformity. CV: RRR, No M/G/R. No JVD. No thrill. No extra heart sounds. PULM: CTA B, no wheezes, crackles, rhonchi. No retractions. No resp. distress. No accessory muscle use. ABD: S, NT, ND, +BS. No rebound. No HSM. EXTR: No c/c/e PSYCH: Normally interactive. Conversant.    Assessment and Plan: ***  Signed Lamar Blinks, MD

## 2021-11-27 ENCOUNTER — Other Ambulatory Visit (HOSPITAL_COMMUNITY)
Admission: RE | Admit: 2021-11-27 | Discharge: 2021-11-27 | Disposition: A | Discharge status: Home / Self Care | Payer: 59 | Source: Ambulatory Visit | Attending: Family Medicine | Admitting: Family Medicine

## 2021-11-27 ENCOUNTER — Ambulatory Visit (INDEPENDENT_AMBULATORY_CARE_PROVIDER_SITE_OTHER): Payer: 59 | Admitting: Family Medicine

## 2021-11-27 VITALS — BP 112/60 | HR 77 | Temp 97.7°F | Resp 18 | Ht 64.0 in | Wt 220.8 lb

## 2021-11-27 DIAGNOSIS — Z6837 Body mass index (BMI) 37.0-37.9, adult: Secondary | ICD-10-CM | POA: Diagnosis not present

## 2021-11-27 DIAGNOSIS — Z124 Encounter for screening for malignant neoplasm of cervix: Secondary | ICD-10-CM

## 2021-11-27 DIAGNOSIS — E6609 Other obesity due to excess calories: Secondary | ICD-10-CM

## 2021-11-27 MED ORDER — OZEMPIC (0.25 OR 0.5 MG/DOSE) 2 MG/1.5ML ~~LOC~~ SOPN: 0.2500 mg | PEN_INJECTOR | SUBCUTANEOUS | 3 refills | Status: DC

## 2021-11-27 NOTE — Patient Instructions (Signed)
Good to see you again today - I will be in touch with your pap and labs Ok to start on Ozempic at 0.'25mg'$  weekly for weight loss, can go to 0.5 mg after 4 weeks and increase from there as needed If you need any assistance getting started with the injections we are happy to help!  Let me know how it goes getting the rx filled

## 2021-11-28 ENCOUNTER — Encounter: Payer: Self-pay | Admitting: Family Medicine

## 2021-11-28 LAB — BASIC METABOLIC PANEL
BUN: 11 mg/dL (ref 6–23)
CO2: 27 mEq/L (ref 19–32)
Calcium: 9 mg/dL (ref 8.4–10.5)
Chloride: 100 mEq/L (ref 96–112)
Creatinine, Ser: 0.88 mg/dL (ref 0.40–1.20)
GFR: 79.1 mL/min (ref >60.00)
Glucose, Bld: 88 mg/dL (ref 70–99)
Potassium: 4.2 mEq/L (ref 3.5–5.1)
Sodium: 134 mEq/L — ABNORMAL LOW (ref 135–145)

## 2021-11-30 ENCOUNTER — Encounter: Payer: Self-pay | Admitting: Family Medicine

## 2021-11-30 LAB — CYTOLOGY - PAP
Comment: NEGATIVE
Diagnosis: NEGATIVE
High risk HPV: NEGATIVE

## 2022-01-02 ENCOUNTER — Encounter: Payer: Self-pay | Admitting: Family Medicine

## 2022-01-04 ENCOUNTER — Telehealth: Payer: Self-pay

## 2022-01-04 NOTE — Telephone Encounter (Signed)
PA initiated via Covermymeds; KEY: [id number].   Does Pt have type 2 diabetes? I do not see a history of it in the chart.

## 2022-01-05 ENCOUNTER — Telehealth: Payer: Self-pay

## 2022-01-05 MED ORDER — WEGOVY 0.25 MG/0.5ML ~~LOC~~ SOAJ: 0.2500 mg | SUBCUTANEOUS | 0 refills | Status: DC

## 2022-01-05 NOTE — Telephone Encounter (Signed)
PA initiated via Covermymeds; KEY: [id number]. Awaiting determination.

## 2022-01-08 NOTE — Telephone Encounter (Signed)
PA approved. Effective 01/05/22 to 08/03/22.

## 2022-01-18 ENCOUNTER — Inpatient Hospital Stay: Payer: 59 | Attending: Family Medicine | Admitting: Genetic Counselor

## 2022-01-18 ENCOUNTER — Inpatient Hospital Stay: Payer: 59

## 2022-01-18 ENCOUNTER — Other Ambulatory Visit: Payer: Self-pay

## 2022-01-18 DIAGNOSIS — Z800 Family history of malignant neoplasm of digestive organs: Secondary | ICD-10-CM
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
## 2022-01-19 ENCOUNTER — Encounter: Payer: Self-pay | Admitting: Genetic Counselor

## 2022-01-19 DIAGNOSIS — Z800 Family history of malignant neoplasm of digestive organs: Secondary | ICD-10-CM | POA: Insufficient documentation

## 2022-02-09 ENCOUNTER — Ambulatory Visit: Payer: Self-pay | Admitting: Genetic Counselor

## 2022-02-09 ENCOUNTER — Encounter: Payer: Self-pay | Admitting: Genetic Counselor

## 2022-02-09 ENCOUNTER — Telehealth: Payer: Self-pay | Admitting: Genetic Counselor

## 2022-02-09 DIAGNOSIS — Z1589 Genetic susceptibility to other disease: Secondary | ICD-10-CM | POA: Insufficient documentation

## 2022-02-09 DIAGNOSIS — Z1509 Genetic susceptibility to other malignant neoplasm: Secondary | ICD-10-CM

## 2022-02-09 NOTE — Telephone Encounter (Signed)
I attempted to contact Susan Roth to discuss her genetic testing results. She did not answer and her voicemail box is full.  [name], MS, [facility] Genetic Counselor [email or web address] (P) [phone]

## 2022-02-09 NOTE — Progress Notes (Unsigned)
HPI:   [name] was previously seen in the [place] [facility] due to a family history of a MLH1 gene mutation. Please refer to our prior cancer genetics clinic note for more information regarding our discussion, assessment and recommendations, at the time. [name] recent genetic test results were disclosed to her, as were recommendations warranted by these results. These results and recommendations are discussed in more detail below.  CANCER HISTORY:  Oncology History   No history exists.    FAMILY HISTORY:  We obtained a detailed, 4-generation family history.  Significant diagnoses are listed below:      Family History  Problem Relation Age of Onset   Hyperlipidemia Mother     Colon polyps Mother     Nephrolithiasis Mother     Diverticulosis Mother     Nephrolithiasis Father     Depression Father          bipolar   Colon cancer Father 88        Lynch   Bladder Cancer Father 58   Asthma Sister     Colon polyps Sister          paternal half sister; Lynch syndrome   Asthma Sister     Allergies Sister     Allergies Brother     Clotting disorder Maternal Uncle     Colon cancer Paternal Aunt 31   Diabetes Paternal Aunt     Colon cancer Paternal Uncle 64   Colon cancer Paternal Uncle 77   Ovarian cancer Maternal Grandmother     Colon cancer Other          double great uncle from both mom and dads side   Esophageal cancer Neg Hx     Rectal cancer Neg Hx     Stomach cancer Neg Hx             [name] full sister has a mutation in the MLH1 gene (c.1381A>T (p.Lys461*)). Her father was diagnosed with colon cancer at age 52 and bladder cancer at age 62. He is an obligate carrier for the familial MLH1 gene mutation. Her paternal half-aunt tested positive for the familial MLH1 gene mutation (c.1381A>T (p.Lys461*)). She has two paternal uncles. One was diagnosed with colon cancer at age 81 and he is deceased. Her other paternal uncle was diagnosed with colon  cancer at age 81. One of her paternal aunts was diagnosed with colon cancer at age 85. [name] reports her paternal grandmother was diagnosed with cervical cancer (possibly uterine or ovarian cancer), she is deceased. There is no reported Ashkenazi Jewish ancestry.  GENETIC TEST RESULTS:  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 Specifically, this variant is c.1381A>T.  The test report has been scanned into EPIC and is located under the Molecular Pathology section of the Results Review tab.  A portion of the result report is included below for reference. Genetic testing reported out on 02/05/2022.       Clinical Information:   Pathogenic variants in the MLH1 gene are associated with Lynch syndrome.   The cancers associated with MLH1 are: Colorectal cancer, 46-61% risk (average age of diagnosis is 60) Endometrial cancer, 34-54% risk (average age of diagnosis is 83) Ovarian cancer, 4-20% risk (average age of diagnosis is 72) Renal pelvis and/or ureter, up to 5% risk (average age of diagnosis is 25-60) Bladder cancer, up to a 7% risk (average age of diagnosis is 106)  Gastric cancer, up to a 7% risk (average age of diagnosis is 80) Small bowel cancer, up to a 11% risk (average age of diagnosis is 69) Biliary tract cancer, up to 3.7% risk (average age of diagnosis is 52) Pancreatic cancer, 6.2% risk (average age of diagnosis is not known) Breast, prostate, and brain cancer risk may be elevated   Management Recommendations:   Colorectal Cancer Screening: High quality colonoscopy at age 70-25 or 2-5 years prior to the earliest colon cancer if it is diagnosed before age 40 and repeat every 1-2 years   Endometrial Cancer Screening/Risk Reduction: Women should report any abnormal uterine bleeding or postmenopausal bleeding. The evaluation of these symptoms should include an endometrial biopsy.  A hysterectomy may be considered. The  timing should be individualized based on whether childbearing is complete, comorbidities, and family history. Endometrial cancer screening does not have a proven benefit in women with Lynch Syndrome. However, endometrial biopsy is highly sensitive and specific as a diagnostic procedure. Screening via endometrial biopsy every 1-2 years starting at age 75-35 can be considered.  Transvaginal ultrasounds may be considered in postmenopausal women at their clinician's discretion.    Ovarian Cancer Screening/Risk Reduction: A prophylactic bilateral salpingo-oophorectomy (BSO), or having the ovaries and fallopian tubes removed, may be considered. A BSO is estimated to reduce the risk of ovarian cancer by up to 96%. Timing of a BSO should be individualized based on whether childbearing is complete, menopause status, comorbidities, and family history. Women should be aware of symptoms that might be associated with the development of ovarian cancer including pelvic or abdominal pain, bloating, increased abdominal girth, difficulty eating, early satiety, or urinary frequency or urgency. Symptoms that persist for several weeks and are a change from a woman's baseline should prompt evaluation by her physician.  Current data does not support routine ovarian screening for Lynch syndrome, therefore it may be considered at the clinician's discretion. Screening includes transvaginal ultrasounds and a blood test to measure CA-125 levels.   Urothelial Cancer Screening: Annual urinalysis starting at age 54-35 may be considered in selected individuals such as those with a family history of urothelial cancer (renal pelvis, ureter, and/or bladder).   Gastric and Small Bowel Cancer Screening: Upper GI surveillance with esophagogastroduodenoscopy (EGD) starting at age 72-40 and repeat every 2-4 years, preferably performed in conjunction with colonoscopy. Random biopsy of the proximal and distal stomach should at minimum be  performed on the initial procedure to assess for H. pylori, autoimmune gastritis, and intestinal metaplasia.  Age of initiation prior to 30 years and/or surveillance interval less than 2 years may be considered based on family history of upper GI cancers or high-risk endoscopic findings.  Push enteroscopy can be considered in place of EGD to enhance small bowel visualization, although its incremental yield for detection of neoplasia over EGD remains uncertain. Individuals not undergoing upper endoscopic surveillance should have one-time noninvasive testing for H. pylori at the time of Lynch Syndrome diagnosis, with treatment indicated if H. pylori is detected.    Pancreatic Cancer Screening: Avoid smoking, heavy alcohol use, and obesity. It has been suggested that pancreatic cancer screening be limited to those with a family history of pancreatic cancer (first- or second-degree relative). Ideally, screening should be performed in experienced centers utilizing a multidisciplinary approach under research conditions. Recommended screening include annual endoscopic ultrasound (preferred) and/or MRI of the pancreas starting at age 40 or 70 years younger than the earliest age diagnosis in the family. Annual concurrent CA19-9 testing should  also be considered.  Prostate Cancer Screening: Consider annual PSA blood test and annual digital rectal exam (DRE) at age 43  Brain Cancer Screening: Patients should be educated regarding signs and symptoms of neurologic cancer and the importance of prompt reporting of abnormal symptoms to their physicians.   Skin Screening: Frequency of malignant and benign skin tumors such as sebaceous adenocarcinomas, sebaceous adenomas, and keratoacanthomas has been reported to be increased among patients with Lynch Syndrome, but cumulative lifetime risk and median age of presentation are uncertain. Consider skin exam every 1-2 years with a health care provider skilled in  [place] skin manifestations.   Additional Considerations: Patients of reproductive age should be made aware of options for prenatal diagnosis and assisted reproduction including pre-implantation genetic diagnosis. Individuals with a single pathogenic MLH1 variant are also carriers of constitutional MMR deficiency (CMMRD) syndrome. CMMRD is a childhood-onset cancer predisposition syndrome that can present with hematological malignancies, cancers of the brain and central nervous system, Lynch syndrome-associated cancers (colon, uterine, small bowel, urinary tract), embryonic tumors, and sarcomas. Some affected individuals may also display cafe-au-lait macules. For there to be a risk of CMMRD in offspring, an individual and their partner would each have to have a single pathogenic variant in the same MMR gene; in such a case, the risk of having an affected child is 25%.  This information is based on current understanding of the gene and may change in the future.  Implications for Family Members: Hereditary predisposition to cancer due to pathogenic variants in the MLH1 gene has autosomal dominant inheritance. This means that an individual with a pathogenic variant has a 50% chance of passing the condition on to his/her offspring. Identification of a pathogenic variant allows for the recognition of at-risk relatives who can pursue testing for the familial variant.  Family members are encouraged to consider genetic testing for this familial pathogenic variant. As there are generally no childhood cancer risks associated with pathogenic variants in the MLH1 gene, individuals in the family are not recommended to have testing until they reach at least 46 years of age. They may contact our office at [phone] for more information or to schedule an appointment. Complimentary testing for the familial variant is available for 150 days. Family members who live outside of the area are  encouraged to find a genetic counselor in their area by visiting: PanelJobs.es.  Resources: FORCE (Facing Our Risk of Cancer Empowered) is a resource for those with a hereditary predisposition to develop cancer.  FORCE provides information about risk reduction, advocacy, legislation, and clinical trials.  Additionally, [employer]CE provides a platform for collaboration and support which includes: peer navigation, message boards, local support groups, a toll-free helpline, research registry and recruitment, advocate training, published medical research, webinars, brochures, mastectomy photos, and more.  For more information, visit www.facingourrisk.[employer].org  Lynch [employer]- www.lynchcancers.com  [employer]- www.kintalk.org  Our contact number was provided. [name] questions were answered to her satisfaction, and she knows she is welcome to call us at anytime with additional questions or concerns.   [name], MS, [facility] Genetic Counselor [email or web address] (P) [phone]

## 2022-02-13 ENCOUNTER — Telehealth: Payer: Self-pay | Admitting: Genetic Counselor

## 2022-02-13 DIAGNOSIS — Z1509 Genetic susceptibility to other malignant neoplasm: Secondary | ICD-10-CM | POA: Insufficient documentation

## 2022-02-13 NOTE — Telephone Encounter (Signed)
I contacted Ms. Ashline to discuss her genetic testing results. A single pathogenic variant was identified in the MLH1 gene. 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 Detailed clinic note to follow.  The test report has been scanned into EPIC and is located under the Molecular Pathology section of the Results Review tab.  A portion of the result report is included below for reference.   [name], MS, [facility] Genetic Counselor [email or web address] (P) [phone]

## 2022-02-14 ENCOUNTER — Encounter: Payer: Self-pay | Admitting: Family Medicine

## 2022-02-14 DIAGNOSIS — Z1509 Genetic susceptibility to other malignant neoplasm: Secondary | ICD-10-CM

## 2022-02-22 ENCOUNTER — Encounter: Payer: Self-pay | Admitting: Genetic Counselor

## 2022-02-22 ENCOUNTER — Telehealth: Payer: Self-pay | Admitting: General Practice

## 2022-02-22 NOTE — Telephone Encounter (Signed)
Called pt to schedule surgical consult. Unable to leave a message on VM due to VM is full.  Mychart message sent to patient.

## 2022-02-27 ENCOUNTER — Telehealth: Payer: Self-pay

## 2022-02-27 NOTE — Telephone Encounter (Unsigned)
Unable to reach pt by phone. Voice machine full.  My chart message sent to pt to call our office back and schedule an a previsit + EGD/colonoscopy with Dr. Carlean Purl.

## 2022-02-27 NOTE — Telephone Encounter (Signed)
-----  Message from Gatha Mayer, MD sent at 02/26/2022  4:18 PM EDT ----- Regarding: RE: MLH1 Mutation Remo Lipps,  Please set up a previsit + EGD/colonoscopy appointment for her  Diagnosis is Lynch Syndrome  - MLH1 mutation  Thanks  CEG  ----- Message ----- From: Katheren Shams, Counselor Sent: 02/13/2022  11:19 AM EDT To: Gatha Mayer, MD Subject: MLH1 Mutation                                  Dr. Carlean Purl,  Ms. 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 She will need to be scheduled for a colonoscopy and EGD. I faxed your office her results/detailed screening recommendations for MLH1. Please let me know if you have any questions.  Thanks, Mel Almond

## 2022-02-28 NOTE — Telephone Encounter (Signed)
Chart reviewed: Pt has read My chart message that was sent to pt: Me to ANGELES ZEHNER       02/27/22 10:36 AM Hi Marily,                 Dr. Carlean Purl is recommending that we schedule you for a previsit appointment  and schedule you for an Upper Endoscopy and Colonoscopy: Please call our office to get this scheduled please: Thanks,  Remo Lipps RN   Last read by Marlan Palau at  1:56 PM on 02/27/2022.

## 2022-03-14 ENCOUNTER — Institutional Professional Consult (permissible substitution): Payer: 59 | Admitting: Obstetrics and Gynecology

## 2022-04-13 ENCOUNTER — Other Ambulatory Visit: Payer: Self-pay | Admitting: Family Medicine

## 2022-04-13 DIAGNOSIS — E6609 Other obesity due to excess calories: Secondary | ICD-10-CM

## 2022-04-30 ENCOUNTER — Encounter: Payer: Self-pay | Admitting: General Practice

## 2022-04-30 ENCOUNTER — Institutional Professional Consult (permissible substitution): Payer: 59 | Admitting: Obstetrics and Gynecology

## 2022-09-14 ENCOUNTER — Encounter: Payer: Self-pay | Admitting: Family Medicine

## 2022-09-14 DIAGNOSIS — Z1231 Encounter for screening mammogram for malignant neoplasm of breast: Secondary | ICD-10-CM

## 2022-09-18 ENCOUNTER — Encounter: Payer: Self-pay | Admitting: Family Medicine

## 2022-09-18 ENCOUNTER — Ambulatory Visit: Payer: 59 | Admitting: Family Medicine

## 2022-09-18 VITALS — BP 120/59 | HR 88 | Ht 64.0 in | Wt 219.0 lb

## 2022-09-18 DIAGNOSIS — M542 Cervicalgia: Secondary | ICD-10-CM

## 2022-09-18 DIAGNOSIS — M25572 Pain in left ankle and joints of left foot: Secondary | ICD-10-CM

## 2022-09-18 DIAGNOSIS — M25571 Pain in right ankle and joints of right foot: Secondary | ICD-10-CM

## 2022-09-18 MED ORDER — CYCLOBENZAPRINE HCL 5 MG PO TABS: 5.0000 mg | ORAL_TABLET | Freq: Three times a day (TID) | ORAL | 1 refills | Status: DC | PRN

## 2022-09-18 NOTE — Patient Instructions (Addendum)
For your ankles: - holding off on NSAIDs for now given your history  - try wearing compression socks during the day (no ankle edema today) - wear good supportive shoes when walking/standing - rest ice, heat, home exercises/stretching  - if not improving in a few weeks we can get xrays and refer to sports medicine  For your neck: - adding a muscle relaxer for as needed use - heating pad, massage, stretching (handout provided)

## 2022-09-18 NOTE — Progress Notes (Signed)
Acute Office Visit  Subjective:     Patient ID: Susan Roth, female    DOB: 1975/06/08, 47 y.o.   MRN: [id number]  Chief Complaint  Patient presents with   Leg Pain    HPI Patient is in today for ankle pain bilaterally.  Bilateral ankles -Onset: first noticed a few weeks ago while lying in bed -Location: bilateral ankles, generalized  -Duration: 2 weeks -Characteristics: throbbing ache -Aggravating factors: nothing -Alleviating factors: she tried one Advil this morning but not sure it did anything  -Radiating/associated symptoms: -Timing: worse at the end of the day  -Severity: 7/10, does not interfere with ADLs -Reports history of some baseline lower leg swelling with family history of lymphedema. She has not noticed any new swelling to her ankles. Patient denies any chest pain, palpitations, dyspnea, wheezing, recurrent headaches, vision changes.    Additionally, she has been having a few days of left upper trap/neck pain that is tight/achy feeling. States she noticed it while lying in bed - no recent trauma, heavy lifting, etc. States she tried some Advil this morning and that seems to really help. She reports full ROM of neck, but some discomfort associated. No radiation of pain, numbness, or tingling.        ROS All review of systems negative except what is listed in the HPI      Objective:    BP (!) 120/59   Pulse 88   Ht 5\' 4"  (1.626 m)   Wt 219 lb (99.3 kg)   SpO2 99%   BMI 37.59 kg/m    Physical Exam Vitals reviewed.  Constitutional:      Appearance: Normal appearance. She is obese.  Neck:     Comments: Left upper trap with tenderness to palpation and muscle tension noted Musculoskeletal:     Cervical back: Normal range of motion and neck supple.     Comments: Mild nonpitting edema to bilateral lower legs, no significant swelling to ankles; full ankle ROM, normal strength and gait  Skin:    General: Skin is warm and dry.     Capillary  Refill: Capillary refill takes less than 2 seconds.     Findings: No bruising, erythema or rash.  Neurological:     Mental Status: She is alert and oriented to person, place, and time.  Psychiatric:        Mood and Affect: Mood normal.        Behavior: Behavior normal.        Thought Content: Thought content normal.        Judgment: Judgment normal.     No results found for any visits on 09/18/22.      Assessment & Plan:   Problem List Items Addressed This Visit   None Visit Diagnoses     Neck pain on left side    -  Primary - adding a muscle relaxer for as needed use - heating pad, massage, stretching (handout provided)   Relevant Medications   cyclobenzaprine (FLEXERIL) 5 MG tablet   Acute bilateral ankle pain     - holding off on NSAIDs for now given your history (states she vomited blood after taking Aleve several years ago) - try wearing compression socks during the day (no ankle edema today) - wear good supportive shoes when walking/standing - rest ice, heat, home exercises/stretching  - if not improving in a few weeks we can get xrays and refer to sports medicine    Patient aware of signs/symptoms  requiring further/urgent evaluation.     Meds ordered this encounter  Medications   cyclobenzaprine (FLEXERIL) 5 MG tablet    Sig: Take 1 tablet (5 mg total) by mouth 3 (three) times daily as needed for muscle spasms.    Dispense:  30 tablet    Refill:  1    Order Specific Question:   Supervising Provider    Answer:   Danise Edge A [[id number]]    Return if symptoms worsen or fail to improve.  Clayborne Dana, NP

## 2022-09-24 ENCOUNTER — Other Ambulatory Visit: Payer: Self-pay | Admitting: Family Medicine

## 2022-09-24 DIAGNOSIS — N644 Mastodynia: Secondary | ICD-10-CM

## 2022-10-05 ENCOUNTER — Ambulatory Visit
Admission: RE | Admit: 2022-10-05 | Discharge: 2022-10-05 | Disposition: A | Discharge status: Home / Self Care | Payer: 59 | Source: Ambulatory Visit | Attending: Family Medicine | Admitting: Family Medicine

## 2022-10-05 DIAGNOSIS — Z1231 Encounter for screening mammogram for malignant neoplasm of breast: Secondary | ICD-10-CM

## 2022-10-05 DIAGNOSIS — N644 Mastodynia: Secondary | ICD-10-CM

## 2022-10-29 ENCOUNTER — Encounter (INDEPENDENT_AMBULATORY_CARE_PROVIDER_SITE_OTHER): Payer: Self-pay | Admitting: Family Medicine

## 2022-12-04 ENCOUNTER — Encounter: Payer: Self-pay | Admitting: Family Medicine

## 2022-12-24 NOTE — Progress Notes (Unsigned)
Susan Roth at [facility] [address], [address] [place], [place] [address] [phone]  Date:  12/26/2022   Name:  Susan Roth   DOB:  12-09-1975   MRN:  [id number]  PCP:  Susan Cables, MD    Chief Complaint: No chief complaint on file.   History of Present Illness:  Susan Roth is a 47 y.o. very pleasant female patient who presents with the following:  Patient seen today with concern about difficulty with weight loss Most recent visit with myself was about 1 year ago-at that time she was concerned about obesity, had a BMI of 37.9-I prescribed a GLP-1 for her  She has a family history of Lynch syndrome and did end up being diagnosed with Lynch syndrome.  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   Low back pain 12/06/2014   Paresthesia 02/09/2013   IBS (irritable bowel syndrome) 11/13/2012   Admission for sterilization 08/04/2012   Dysmenorrhea 08/04/2012   Obesity 06/25/2012   Urticaria, 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   Anxiety and depression 09/26/2010   ACNE, MILD 09/27/2008   HERPES ZOSTER 09/17/2008    Past Medical History:  Diagnosis Date   Abnormal Pap smear of cervix    cryo   Allergic rhinitis    Allergy    Constipation    is every day- pt off Linzess    Dysfunction of eustachian tube    Endometriosis    GERD (gastroesophageal reflux disease)    occasional   Herpes zoster without mention of complication    History of kidney stones    IBS (irritable bowel  syndrome)    Neuropathy    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    Obesity     Past Surgical History:  Procedure Laterality Date   BILATERAL SALPINGECTOMY Right 08/04/2012   Procedure: BILATERAL SALPINGECTOMY;  Surgeon: Allie Bossier, MD;  Location: [facility];  Service: Gynecology;  Laterality: Right;   CESAREAN SECTION  1998   x1   CHOLECYSTECTOMY N/A 01/30/2018   Procedure: LAPAROSCOPIC CHOLECYSTECTOMY;  Surgeon: Gaynelle Adu, MD;  Location: [facility];  Service: General;  Laterality: N/A;   COLONOSCOPY     LAPAROSCOPIC TUBAL LIGATION N/A 08/04/2012   Procedure: LAPAROSCOPIC TUBAL LIGATION;  Surgeon: Allie Bossier, MD;  Location: [facility];  Service: Gynecology;  Laterality: N/A;  Operative lsc for right salpingectomy & Mirena insertion- pt had a  bilat salphingectomy NOT tubal    TONSILLECTOMY     as an adult - 2011   WISDOM TOOTH EXTRACTION      Social History   Tobacco Use   Smoking status: Never   Smokeless tobacco: Never   Tobacco comments:    positive hx of passive tobacco smoke exposure  Vaping Use   Vaping status: Never Used  Substance Use Topics   Alcohol use: Never    Alcohol/week: 1.0 standard drink of alcohol    Types: 1 Glasses of wine per  week    Comment: occasionally   Drug use: No    Family History  Problem Relation Age of Onset   Hyperlipidemia Mother    Colon polyps Mother    Nephrolithiasis Mother    Diverticulosis Mother    Nephrolithiasis Father    Depression Father        bipolar   Colon cancer Father 34       Lynch   Bladder Cancer Father 20   Asthma Sister    Colon polyps Sister        paternal half sister; Lynch syndrome   Asthma Sister    Allergies Sister    Allergies Brother    Clotting disorder Maternal Uncle    Colon cancer Paternal Aunt 30   Diabetes Paternal Aunt    Colon cancer Paternal Uncle 68   Colon cancer Paternal Uncle 52   Ovarian cancer Maternal Grandmother    Colon cancer Other        double great uncle from both mom and  dads side   Esophageal cancer Neg Hx    Rectal cancer Neg Hx    Stomach cancer Neg Hx     Allergies  Allergen Reactions   Amoxicillin Hives   Penicillins Hives    Has patient had a PCN reaction causing immediate rash, facial/tongue/throat swelling, SOB or lightheadedness with hypotension: No Has patient had a PCN reaction causing severe rash involving mucus membranes or skin necrosis: Yes Has patient had a PCN reaction that required hospitalization: No Has patient had a PCN reaction occurring within the last 10 years: No If all of the above answers are "NO", then may proceed with Cephalosporin use.    Wellbutrin [Bupropion Hcl] Hives    Medication list has been reviewed and updated.  Current Outpatient Medications on File Prior to Visit  Medication Sig Dispense Refill   cyclobenzaprine (FLEXERIL) 5 MG tablet Take 1 tablet (5 mg total) by mouth 3 (three) times daily as needed for muscle spasms. 30 tablet 1   No current facility-administered medications on file prior to visit.    Review of Systems:  As per HPI- otherwise negative.   Physical Examination: There were no vitals filed for this visit. There were no vitals filed for this visit. There is no height or weight on file to calculate BMI. Ideal Body Weight:    GEN: no acute distress. HEENT: Atraumatic, Normocephalic.  Ears and Nose: No external deformity. CV: RRR, No M/G/R. No JVD. No thrill. No extra heart sounds. PULM: CTA B, no wheezes, crackles, rhonchi. No retractions. No resp. distress. No accessory muscle use. ABD: S, NT, ND, +BS. No rebound. No HSM. EXTR: No c/c/e PSYCH: Normally interactive. Conversant.    Assessment and Plan: ***  Signed Abbe Amsterdam, MD

## 2022-12-24 NOTE — Patient Instructions (Incomplete)
It was good to see you today, I will be in touch with your labs. Recommend flu shot and COVID booster this fall Please schedule to see GI ASAP so we can get you started on colon cancer screening.  This is especially important given history of Lynch syndrome  I sent in your rx for Zepbound 2.5 mg; we can go up to 5 mg in a month assuming well tolerated.  Please let me know how this goes

## 2022-12-26 ENCOUNTER — Ambulatory Visit: Payer: 59 | Admitting: Family Medicine

## 2022-12-26 VITALS — BP 122/72 | HR 78 | Temp 97.8°F | Resp 18 | Ht 64.0 in | Wt 220.8 lb

## 2022-12-26 DIAGNOSIS — Z130 Encounter for screening for diseases of the blood and blood-forming organs and certain disorders involving the immune mechanism: Secondary | ICD-10-CM | POA: Diagnosis not present

## 2022-12-26 DIAGNOSIS — Z13220 Encounter for screening for lipoid disorders: Secondary | ICD-10-CM

## 2022-12-26 DIAGNOSIS — Z1509 Genetic susceptibility to other malignant neoplasm: Secondary | ICD-10-CM | POA: Diagnosis not present

## 2022-12-26 DIAGNOSIS — Z131 Encounter for screening for diabetes mellitus: Secondary | ICD-10-CM

## 2022-12-26 DIAGNOSIS — E559 Vitamin D deficiency, unspecified: Secondary | ICD-10-CM | POA: Diagnosis not present

## 2022-12-26 DIAGNOSIS — E669 Obesity, unspecified: Secondary | ICD-10-CM | POA: Diagnosis not present

## 2022-12-26 DIAGNOSIS — Z1329 Encounter for screening for other suspected endocrine disorder: Secondary | ICD-10-CM

## 2022-12-26 MED ORDER — ZEPBOUND 2.5 MG/0.5ML ~~LOC~~ SOAJ
2.5000 mg | SUBCUTANEOUS | 1 refills | Status: DC
Start: 2022-12-26 — End: 2023-02-07

## 2022-12-27 ENCOUNTER — Telehealth: Payer: Self-pay

## 2022-12-27 ENCOUNTER — Encounter: Payer: Self-pay | Admitting: Family Medicine

## 2022-12-27 NOTE — Telephone Encounter (Signed)
Lillia Pauls (Key: [id number])  PA initiated.

## 2022-12-28 NOTE — Telephone Encounter (Signed)
PA approved:   As long as you remain covered by your prescription drug plan and there are no changes to your plan benefits, this request is approved from 12/27/2022 to 08/27/2023. When this approval expires, please speak to your doctor about your treatment

## 2023-01-03 ENCOUNTER — Encounter: Payer: Self-pay | Admitting: Family Medicine

## 2023-01-04 ENCOUNTER — Emergency Department (HOSPITAL_BASED_OUTPATIENT_CLINIC_OR_DEPARTMENT_OTHER)
Admission: EM | Admit: 2023-01-04 | Discharge: 2023-01-04 | Disposition: A | Discharge status: Home / Self Care | Payer: 59 | Attending: Emergency Medicine | Admitting: Emergency Medicine

## 2023-01-04 ENCOUNTER — Emergency Department (HOSPITAL_BASED_OUTPATIENT_CLINIC_OR_DEPARTMENT_OTHER): Payer: 59

## 2023-01-04 ENCOUNTER — Other Ambulatory Visit: Payer: Self-pay

## 2023-01-04 DIAGNOSIS — R109 Unspecified abdominal pain: Secondary | ICD-10-CM | POA: Insufficient documentation

## 2023-01-04 DIAGNOSIS — N390 Urinary tract infection, site not specified: Secondary | ICD-10-CM

## 2023-01-04 LAB — BASIC METABOLIC PANEL
Anion gap: 7 (ref 5–15)
BUN: 15 mg/dL (ref 6–20)
CO2: 29 mmol/L (ref 22–32)
Calcium: 9.3 mg/dL (ref 8.9–10.3)
Chloride: 99 mmol/L (ref 98–111)
Creatinine, Ser: 1.09 mg/dL — ABNORMAL HIGH (ref 0.44–1.00)
GFR, Estimated: >60 mL/min (ref >60)
Glucose, Bld: 92 mg/dL (ref 70–99)
Potassium: 4.3 mmol/L (ref 3.5–5.1)
Sodium: 135 mmol/L (ref 135–145)

## 2023-01-04 LAB — CBC WITH DIFFERENTIAL/PLATELET
Abs Immature Granulocytes: 0.01 10*3/uL (ref 0.00–0.07)
Basophils Absolute: 0 10*3/uL (ref 0.0–0.1)
Basophils Relative: 1 %
Eosinophils Absolute: 0.1 10*3/uL (ref 0.0–0.5)
Eosinophils Relative: 2 %
HCT: 41.5 % (ref 36.0–46.0)
Hemoglobin: 14.4 g/dL (ref 12.0–15.0)
Immature Granulocytes: 0 %
Lymphocytes Relative: 38 %
Lymphs Abs: 1.9 10*3/uL (ref 0.7–4.0)
MCH: 31.9 pg (ref 26.0–34.0)
MCHC: 34.7 g/dL (ref 30.0–36.0)
MCV: 92 fL (ref 80.0–100.0)
Monocytes Absolute: 0.7 10*3/uL (ref 0.1–1.0)
Monocytes Relative: 14 %
Neutro Abs: 2.3 10*3/uL (ref 1.7–7.7)
Neutrophils Relative %: 45 %
Platelets: 337 10*3/uL (ref 150–400)
RBC: 4.51 MIL/uL (ref 3.87–5.11)
RDW: 12.1 % (ref 11.5–15.5)
WBC: 5 10*3/uL (ref 4.0–10.5)
nRBC: 0 % (ref 0.0–0.2)

## 2023-01-04 LAB — URINALYSIS, ROUTINE W REFLEX MICROSCOPIC
Bilirubin Urine: NEGATIVE
Glucose, UA: NEGATIVE mg/dL
Ketones, ur: NEGATIVE mg/dL
Nitrite: NEGATIVE
Protein, ur: 30 mg/dL — AB
RBC / HPF: >50 RBC/hpf (ref 0–5)
Specific Gravity, Urine: 1.026 (ref 1.005–1.030)
pH: 5.5 (ref 5.0–8.0)

## 2023-01-04 LAB — PREGNANCY, URINE: Preg Test, Ur: NEGATIVE

## 2023-01-04 MED ORDER — CIPROFLOXACIN HCL 500 MG PO TABS: 500.0000 mg | ORAL_TABLET | Freq: Two times a day (BID) | ORAL | 0 refills | Status: DC

## 2023-01-04 MED ORDER — CIPROFLOXACIN HCL 500 MG PO TABS
500.0000 mg | ORAL_TABLET | Freq: Once | ORAL | Status: AC
  Administered 2023-01-04: 500 mg via ORAL
  Filled 2023-01-04: qty 1

## 2023-01-04 MED ORDER — FLUCONAZOLE 150 MG PO TABS: 150.0000 mg | ORAL_TABLET | Freq: Once | ORAL | 0 refills | Status: AC

## 2023-01-04 NOTE — ED Provider Notes (Signed)
[place] EMERGENCY DEPARTMENT AT [facility] Provider Note   CSN: [id number] Arrival date & time: 01/04/23  4098     History  Chief Complaint  Patient presents with   Flank Pain    Susan Roth is a 47 y.o. female.  Patient is a 47 year old female with past medical history of kidney stones, irritable bowel.  Patient presenting today with complaints of right flank pain.  This has been ongoing for the past week.  She finished her menstrual period 1 week ago, but began bleeding again yesterday.  She denies urinary complaints.  She denies fevers or chills.  No aggravating or alleviating factors.  The history is provided by the patient.       Home Medications Prior to Admission medications   Medication Sig Start Date End Date Taking? Authorizing Provider  buPROPion (WELLBUTRIN) 75 MG tablet Take 75 mg by mouth daily.    [provider]  tirzepatide (ZEPBOUND) 2.5 MG/0.5ML Pen Inject 2.5 mg into the skin once a week. 12/26/22   Copland, Gwenlyn Found, MD      Allergies    Amoxicillin, Penicillins, and Wellbutrin [bupropion hcl]    Review of Systems   Review of Systems  All other systems reviewed and are negative.   Physical Exam Updated Vital Signs BP (!) 144/86 (BP Location: Right Arm)   Pulse (!) 105   Temp 97.8 F (36.6 C) (Oral)   Resp 18   Ht 5\' 4"  (1.626 m)   Wt 99.3 kg   SpO2 100%   BMI 37.59 kg/m  Physical Exam Vitals and nursing note reviewed.  Constitutional:      General: She is not in acute distress.    Appearance: She is well-developed. She is not diaphoretic.  HENT:     Head: Normocephalic and atraumatic.  Cardiovascular:     Rate and Rhythm: Normal rate and regular rhythm.     Heart sounds: No murmur heard.    No friction rub. No gallop.  Pulmonary:     Effort: Pulmonary effort is normal. No respiratory distress.     Breath sounds: Normal breath sounds. No wheezing.  Abdominal:     General: Bowel sounds are normal. There is  no distension.     Palpations: Abdomen is soft.     Tenderness: There is no abdominal tenderness.  Musculoskeletal:        General: Normal range of motion.     Cervical back: Normal range of motion and neck supple.  Skin:    General: Skin is warm and dry.  Neurological:     General: No focal deficit present.     Mental Status: She is alert and oriented to person, place, and time.     ED Results / Procedures / Treatments   Labs (all labs ordered are listed, but only abnormal results are displayed) Labs Reviewed  PREGNANCY, URINE  URINALYSIS, ROUTINE W REFLEX MICROSCOPIC  BASIC METABOLIC PANEL  CBC WITH DIFFERENTIAL/PLATELET    EKG None  Radiology No results found.  Procedures Procedures    Medications Ordered in ED Medications - No data to display  ED Course/ Medical Decision Making/ A&P  Patient presenting here with right flank pain as described in the HPI.  She arrives here with stable vital signs and is afebrile.  Physical examination reveals mild right-sided CVA tenderness, but is otherwise unremarkable.  Workup initiated including CBC, metabolic panel, and urinalysis.  Blood work unremarkable, but UA does show white cells and white  cell clumping consistent with UTI.  CT scan of the abdomen and pelvis with renal protocol obtained showing no evidence for acute intra-abdominal pathology.  Symptoms most likely related to urinary tract infection.  She will be treated with Cipro and follow-up as needed.  Final Clinical Impression(s) / ED Diagnoses Final diagnoses:  None    Rx / DC Orders ED Discharge Orders     None         Geoffery Lyons, MD 01/04/23 [phone]

## 2023-01-04 NOTE — Discharge Instructions (Signed)
Begin taking Cipro as prescribed.  Drink plenty of fluids.  Follow-up with primary doctor if symptoms are not improving in the next few days.

## 2023-01-04 NOTE — ED Notes (Signed)
Pt given discharge instructions and reviewed prescriptions. Opportunities given for questions. Pt verbalizes understanding. PIV removed x1. Stone,Heather R, RN 

## 2023-01-04 NOTE — ED Triage Notes (Signed)
In POV w/ c/o right-sided flank pain x1 week. Pain 8/10. Hx of kidney stones...denies n/v

## 2023-01-13 NOTE — Patient Instructions (Incomplete)
It was good to see you again today, recommend COVID and flu shot this fall I will be in touch with your labs asap and we will plan our next step!

## 2023-01-13 NOTE — Progress Notes (Unsigned)
[facility] at [facility] [address], [address] [place], [place] [address] [phone]  Date:  01/14/2023   Name:  Susan Roth   DOB:  02-11-1976   MRN:  [id number]  PCP:  Pearline Cables, MD    Chief Complaint: No chief complaint on file.   History of Present Illness:  Susan Roth is a 47 y.o. 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   Family history of colon cancer 01/19/2022   Family history of Lynch syndrome 11/05/2018   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   Low back pain 12/06/2014   Paresthesia 02/09/2013   IBS (irritable bowel syndrome) 11/13/2012   Admission for sterilization 08/04/2012   Dysmenorrhea 08/04/2012   Obesity 06/25/2012   Urticaria, 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   Anxiety and depression 09/26/2010   ACNE, MILD 09/27/2008   HERPES ZOSTER 09/17/2008    Past Medical History:  Diagnosis Date   Abnormal Pap smear of cervix    cryo   Allergic rhinitis    Allergy    Constipation    is every day- pt off Linzess    Dysfunction of eustachian tube    Endometriosis    GERD (gastroesophageal reflux disease)    occasional   Herpes zoster without mention of complication    History of kidney stones    IBS (irritable bowel syndrome)    Neuropathy    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    Obesity     Past Surgical History:  Procedure  Laterality Date   BILATERAL SALPINGECTOMY Right 08/04/2012   Procedure: BILATERAL SALPINGECTOMY;  Surgeon: Allie Bossier, MD;  Location: [facility];  Service: Gynecology;  Laterality: Right;   CESAREAN SECTION  1998   x1   CHOLECYSTECTOMY N/A 01/30/2018   Procedure: LAPAROSCOPIC CHOLECYSTECTOMY;  Surgeon: Gaynelle Adu, MD;  Location: [facility];  Service: General;  Laterality: N/A;   COLONOSCOPY     LAPAROSCOPIC TUBAL LIGATION N/A 08/04/2012   Procedure: LAPAROSCOPIC TUBAL LIGATION;  Surgeon: Allie Bossier, MD;  Location: [facility];  Service: Gynecology;  Laterality: N/A;  Operative lsc for right salpingectomy & Mirena insertion- pt had a  bilat salphingectomy NOT tubal    TONSILLECTOMY     as an adult - 2011   WISDOM TOOTH EXTRACTION      Social History   Tobacco Use   Smoking status: Never   Smokeless tobacco: Never   Tobacco comments:    positive hx of passive tobacco smoke exposure  Vaping Use   Vaping status: Never Used  Substance Use Topics   Alcohol use: Never    Alcohol/week: 1.0 standard drink of alcohol    Types: 1 Glasses of wine per week    Comment: occasionally   Drug use: No    Family History  Problem Relation Age of Onset   Hyperlipidemia Mother  Colon polyps Mother    Nephrolithiasis Mother    Diverticulosis Mother    Nephrolithiasis Father    Depression Father        bipolar   Colon cancer Father 19       Lynch   Bladder Cancer Father 77   Asthma Sister    Colon polyps Sister        paternal half sister; Lynch syndrome   Asthma Sister    Allergies Sister    Allergies Brother    Clotting disorder Maternal Uncle    Colon cancer Paternal Aunt 30   Diabetes Paternal Aunt    Colon cancer Paternal Uncle 20   Colon cancer Paternal Uncle 9   Ovarian cancer Maternal Grandmother    Colon cancer Other        double great uncle from both mom and dads side   Esophageal cancer Neg Hx    Rectal cancer Neg Hx    Stomach cancer Neg Hx     Allergies  Allergen Reactions    Amoxicillin Hives   Penicillins Hives    Has patient had a PCN reaction causing immediate rash, facial/tongue/throat swelling, SOB or lightheadedness with hypotension: No Has patient had a PCN reaction causing severe rash involving mucus membranes or skin necrosis: Yes Has patient had a PCN reaction that required hospitalization: No Has patient had a PCN reaction occurring within the last 10 years: No If all of the above answers are "NO", then may proceed with Cephalosporin use.    Wellbutrin [Bupropion Hcl] Hives    Medication list has been reviewed and updated.  Current Outpatient Medications on File Prior to Visit  Medication Sig Dispense Refill   buPROPion (WELLBUTRIN) 75 MG tablet Take 75 mg by mouth daily.     ciprofloxacin (CIPRO) 500 MG tablet Take 1 tablet (500 mg total) by mouth 2 (two) times daily. One po bid x 7 days 14 tablet 0   tirzepatide (ZEPBOUND) 2.5 MG/0.5ML Pen Inject 2.5 mg into the skin once a week. 2 mL 1   No current facility-administered medications on file prior to visit.    Review of Systems:  As per HPI- otherwise negative.   Physical Examination: There were no vitals filed for this visit. There were no vitals filed for this visit. There is no height or weight on file to calculate BMI. Ideal Body Weight:    GEN: no acute distress. HEENT: Atraumatic, Normocephalic.  Ears and Nose: No external deformity. CV: RRR, No M/G/R. No JVD. No thrill. No extra heart sounds. PULM: CTA B, no wheezes, crackles, rhonchi. No retractions. No resp. distress. No accessory muscle use. ABD: S, NT, ND, +BS. No rebound. No HSM. EXTR: No c/c/e PSYCH: Normally interactive. Conversant.    Assessment and Plan: ***  Signed Abbe Amsterdam, MD

## 2023-01-14 ENCOUNTER — Ambulatory Visit: Payer: 59 | Admitting: Family Medicine

## 2023-01-14 ENCOUNTER — Other Ambulatory Visit (HOSPITAL_COMMUNITY)
Admission: RE | Admit: 2023-01-14 | Discharge: 2023-01-14 | Disposition: A | Discharge status: Home / Self Care | Payer: 59 | Source: Ambulatory Visit | Attending: Family Medicine | Admitting: Family Medicine

## 2023-01-14 VITALS — BP 112/82 | HR 84 | Temp 97.9°F | Resp 18 | Ht 64.0 in | Wt 222.4 lb

## 2023-01-14 DIAGNOSIS — N926 Irregular menstruation, unspecified: Secondary | ICD-10-CM

## 2023-01-14 DIAGNOSIS — Z113 Encounter for screening for infections with a predominantly sexual mode of transmission: Secondary | ICD-10-CM | POA: Insufficient documentation

## 2023-01-14 DIAGNOSIS — R319 Hematuria, unspecified: Secondary | ICD-10-CM | POA: Insufficient documentation

## 2023-01-14 DIAGNOSIS — R109 Unspecified abdominal pain: Secondary | ICD-10-CM | POA: Diagnosis not present

## 2023-01-14 LAB — POCT URINE PREGNANCY: Preg Test, Ur: NEGATIVE

## 2023-01-15 LAB — URINE CULTURE
MICRO NUMBER:: 15381126
Result:: NO GROWTH
SPECIMEN QUALITY:: ADEQUATE

## 2023-01-16 ENCOUNTER — Encounter: Payer: Self-pay | Admitting: Family Medicine

## 2023-01-16 DIAGNOSIS — N926 Irregular menstruation, unspecified: Secondary | ICD-10-CM

## 2023-01-16 LAB — CERVICOVAGINAL ANCILLARY ONLY
Bacterial Vaginitis (gardnerella): NEGATIVE
Candida Glabrata: NEGATIVE
Candida Vaginitis: NEGATIVE
Chlamydia: NEGATIVE
Comment: NEGATIVE
Comment: NEGATIVE
Comment: NEGATIVE
Comment: NEGATIVE
Comment: NEGATIVE
Comment: NORMAL
Neisseria Gonorrhea: NEGATIVE
Trichomonas: NEGATIVE

## 2023-01-26 ENCOUNTER — Ambulatory Visit (HOSPITAL_BASED_OUTPATIENT_CLINIC_OR_DEPARTMENT_OTHER)
Admission: RE | Admit: 2023-01-26 | Discharge: 2023-01-26 | Disposition: A | Discharge status: Home / Self Care | Payer: 59 | Source: Ambulatory Visit | Attending: Family Medicine | Admitting: Family Medicine

## 2023-01-26 DIAGNOSIS — N926 Irregular menstruation, unspecified: Secondary | ICD-10-CM | POA: Diagnosis present

## 2023-02-07 MED ORDER — TIRZEPATIDE-WEIGHT MANAGEMENT 5 MG/0.5ML ~~LOC~~ SOLN: 5.0000 mg | SUBCUTANEOUS | 1 refills | Status: DC

## 2023-02-07 NOTE — Addendum Note (Signed)
Addended by: Abbe Amsterdam C on: 02/07/2023 04:28 PM   Modules accepted: Orders

## 2023-03-07 ENCOUNTER — Ambulatory Visit: Payer: 59 | Admitting: Family Medicine

## 2023-03-07 VITALS — BP 110/72 | HR 92 | Temp 97.8°F | Resp 18 | Ht 64.0 in | Wt 210.4 lb

## 2023-03-07 DIAGNOSIS — Z636 Dependent relative needing care at home: Secondary | ICD-10-CM

## 2023-03-07 DIAGNOSIS — Z23 Encounter for immunization: Secondary | ICD-10-CM | POA: Diagnosis not present

## 2023-03-07 NOTE — Progress Notes (Signed)
[facility] at [facility] [address] [place], [place] [address] [phone]  Date:  03/07/2023   Name:  Susan Roth   DOB:  [birth year]   MRN:  [id number]  PCP:  Pearline Cables, MD    Chief Complaint: FMLA (Flu shot today: yes)   History of Present Illness:  Susan Roth is a 47 y.o. 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  Soon he will have his bladder removed (later on this month) and she will be his main caregiver.  She plans to have him with her at her home in [place], he lives in [place].  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   Low back pain 12/06/2014   Paresthesia 02/09/2013   IBS (irritable bowel syndrome) 11/13/2012   Admission for sterilization 08/04/2012   Dysmenorrhea 08/04/2012   Obesity 06/25/2012   Urticaria, 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   Anxiety and depression 09/26/2010   ACNE, MILD 09/27/2008   Herpes zoster 09/17/2008    Past Medical History:  Diagnosis Date   Abnormal Pap smear of cervix    cryo   Allergic rhinitis    Allergy    Constipation    is every day- pt off Linzess    Dysfunction of eustachian tube     Endometriosis    GERD (gastroesophageal reflux disease)    occasional   Herpes zoster without mention of complication    History of kidney stones    IBS (irritable bowel syndrome)    Neuropathy    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    Obesity     Past Surgical History:  Procedure Laterality Date   BILATERAL SALPINGECTOMY Right 08/04/2012   Procedure: BILATERAL SALPINGECTOMY;  Surgeon: Allie Bossier, MD;  Location: [facility];  Service: Gynecology;  Laterality: Right;   CESAREAN SECTION  1998   x1   CHOLECYSTECTOMY N/A 01/30/2018   Procedure: LAPAROSCOPIC CHOLECYSTECTOMY;  Surgeon: Gaynelle Adu, MD;  Location: [facility];  Service: General;  Laterality: N/A;   COLONOSCOPY     LAPAROSCOPIC TUBAL LIGATION N/A 08/04/2012   Procedure: LAPAROSCOPIC TUBAL LIGATION;  Surgeon: Allie Bossier, MD;  Location: [facility];  Service: Gynecology;  Laterality: N/A;  Operative lsc for right salpingectomy & Mirena insertion- pt had a  bilat salphingectomy NOT tubal    TONSILLECTOMY     as an adult - 2011   WISDOM TOOTH EXTRACTION      Social History   Tobacco Use   Smoking status: Never   Smokeless tobacco: Never   Tobacco comments:  positive hx of passive tobacco smoke exposure  Vaping Use   Vaping status: Never Used  Substance Use Topics   Alcohol use: Never    Alcohol/week: 1.0 standard drink of alcohol    Types: 1 Glasses of wine per week    Comment: occasionally   Drug use: No    Family History  Problem Relation Age of Onset   Hyperlipidemia Mother    Colon polyps Mother    Nephrolithiasis Mother    Diverticulosis Mother    Nephrolithiasis Father    Depression Father        bipolar   Colon cancer Father 70       Lynch   Bladder Cancer Father 28   Asthma Sister    Colon polyps Sister        paternal half sister; Lynch syndrome   Asthma Sister    Allergies Sister    Allergies Brother    Clotting disorder Maternal Uncle    Colon cancer Paternal Aunt 30   Diabetes Paternal  Aunt    Colon cancer Paternal Uncle 43   Colon cancer Paternal Uncle 39   Ovarian cancer Maternal Grandmother    Colon cancer Other        double great uncle from both mom and dads side   Esophageal cancer Neg Hx    Rectal cancer Neg Hx    Stomach cancer Neg Hx     Allergies  Allergen Reactions   Amoxicillin Hives   Penicillins Hives    Has patient had a PCN reaction causing immediate rash, facial/tongue/throat swelling, SOB or lightheadedness with hypotension: No Has patient had a PCN reaction causing severe rash involving mucus membranes or skin necrosis: Yes Has patient had a PCN reaction that required hospitalization: No Has patient had a PCN reaction occurring within the last 10 years: No If all of the above answers are "NO", then may proceed with Cephalosporin use.    Wellbutrin [Bupropion Hcl] Hives    Medication list has been reviewed and updated.  Current Outpatient Medications on File Prior to Visit  Medication Sig Dispense Refill   buPROPion (WELLBUTRIN) 75 MG tablet Take 75 mg by mouth daily.     tirzepatide 5 MG/0.5ML injection vial Inject 5 mg into the skin once a week. 2 mL 1   No current facility-administered medications on file prior to visit.    Review of Systems:  As per HPI- otherwise negative.   Physical Examination: Vitals:   03/07/23 1019  BP: 110/72  Pulse: 92  Resp: 18  Temp: 97.8 F (36.6 C)  SpO2: 97%   Vitals:   03/07/23 1019  Weight: 210 lb 6.4 oz (95.4 kg)  Height: 5\' 4"  (1.626 m)   Body mass index is 36.12 kg/m. Ideal Body Weight: Weight in (lb) to have BMI = 25: 145.3  GEN: no acute distress.  Obese, looks well HEENT: Atraumatic, Normocephalic.  Ears and Nose: No external deformity. CV: RRR, No M/G/R. No JVD. No thrill. No extra heart sounds. PULM: CTA B, no wheezes, crackles, rhonchi. No retractions. No resp. distress. No accessory muscle use. EXTR: No c/c/e PSYCH: Normally interactive. Conversant.    Assessment and  Plan: Caregiver burden  Immunization due - Plan: Flu vaccine trivalent PF, 6mos and older(Flulaval,Afluria,Fluarix,Fluzone) Completed FMLA paperwork for patient, requesting that she be allowed to work from home until December 17 of this year so that she may provide care for her father after his upcoming surgical treatment for bladder cancer  flu shot today  Signed Abbe Amsterdam, MD

## 2023-03-07 NOTE — Patient Instructions (Signed)
It was good to see you today- I will take care of your paperwork!   Flu shot today Recommend covid booster at your pharmacy as well

## 2023-03-08 ENCOUNTER — Encounter: Payer: Self-pay | Admitting: Family Medicine

## 2023-03-15 ENCOUNTER — Other Ambulatory Visit: Payer: Self-pay | Admitting: Medical Genetics

## 2023-03-15 DIAGNOSIS — Z006 Encounter for examination for normal comparison and control in clinical research program: Secondary | ICD-10-CM

## 2023-04-01 ENCOUNTER — Other Ambulatory Visit (HOSPITAL_COMMUNITY)
Admission: RE | Admit: 2023-04-01 | Discharge: 2023-04-01 | Disposition: A | Discharge status: Home / Self Care | Payer: 59 | Source: Ambulatory Visit | Attending: Oncology | Admitting: Oncology

## 2023-04-01 DIAGNOSIS — Z006 Encounter for examination for normal comparison and control in clinical research program: Secondary | ICD-10-CM | POA: Insufficient documentation

## 2023-04-05 ENCOUNTER — Other Ambulatory Visit: Payer: Self-pay | Admitting: Family Medicine

## 2023-04-05 MED ORDER — ZEPBOUND 7.5 MG/0.5ML ~~LOC~~ SOAJ: 7.5000 mg | SUBCUTANEOUS | 0 refills | Status: DC

## 2023-05-01 ENCOUNTER — Other Ambulatory Visit: Payer: Self-pay | Admitting: Family Medicine

## 2023-05-01 MED ORDER — ZEPBOUND 10 MG/0.5ML ~~LOC~~ SOAJ: 10.0000 mg | SUBCUTANEOUS | 0 refills | Status: DC

## 2023-06-04 ENCOUNTER — Other Ambulatory Visit: Payer: Self-pay | Admitting: Family Medicine

## 2023-06-04 MED ORDER — ZEPBOUND 12.5 MG/0.5ML ~~LOC~~ SOAJ: 12.5000 mg | SUBCUTANEOUS | 0 refills | Status: DC

## 2023-06-17 LAB — GENECONNECT MOLECULAR SCREEN: Genetic Analysis Overall Interpretation: POSITIVE — AB

## 2023-06-18 ENCOUNTER — Telehealth: Payer: Self-pay | Admitting: Medical Genetics

## 2023-06-18 DIAGNOSIS — Z1589 Genetic susceptibility to other disease: Secondary | ICD-10-CM

## 2023-06-18 NOTE — Telephone Encounter (Signed)
[place] [facility] 06/18/2023 5:34 PM  FIRST ATTEMPT: Confirmed I was speaking with Susan Roth [phone] by using name and DOB. Informed participant the reason for this call is to provide results for the above study. Results revealed Lynch Syndrome, although [name] was previously aware of this result. Due to her family history, she had genetic counseling and testing that identified this change. Genetic counseling was offered and participant declined since she had this done previously. All questions were answered, and participant was thanked for their time and support of the above study. Participant was encouraged to contact [facility] if they have any further questions or concerns.

## 2023-06-20 ENCOUNTER — Encounter: Payer: Self-pay | Admitting: Family Medicine

## 2023-06-20 NOTE — Addendum Note (Signed)
Addended by: Abbe Amsterdam C on: 06/20/2023 12:45 PM   Modules accepted: Orders

## 2023-06-23 ENCOUNTER — Encounter (HOSPITAL_BASED_OUTPATIENT_CLINIC_OR_DEPARTMENT_OTHER): Payer: Self-pay | Admitting: Emergency Medicine

## 2023-06-23 ENCOUNTER — Emergency Department (HOSPITAL_BASED_OUTPATIENT_CLINIC_OR_DEPARTMENT_OTHER)
Admission: EM | Admit: 2023-06-23 | Discharge: 2023-06-23 | Disposition: A | Discharge status: Home / Self Care | Payer: 59 | Attending: Emergency Medicine | Admitting: Emergency Medicine

## 2023-06-23 ENCOUNTER — Other Ambulatory Visit: Payer: Self-pay

## 2023-06-23 DIAGNOSIS — R000 Tachycardia, unspecified: Secondary | ICD-10-CM | POA: Insufficient documentation

## 2023-06-23 DIAGNOSIS — Z20822 Contact with and (suspected) exposure to covid-19: Secondary | ICD-10-CM | POA: Diagnosis not present

## 2023-06-23 DIAGNOSIS — J101 Influenza due to other identified influenza virus with other respiratory manifestations: Secondary | ICD-10-CM | POA: Diagnosis not present

## 2023-06-23 DIAGNOSIS — R0981 Nasal congestion: Secondary | ICD-10-CM | POA: Diagnosis present

## 2023-06-23 LAB — RESP PANEL BY RT-PCR (RSV, FLU A&B, COVID)  RVPGX2
Influenza A by PCR: POSITIVE — AB
Influenza B by PCR: NEGATIVE
Resp Syncytial Virus by PCR: NEGATIVE
SARS Coronavirus 2 by RT PCR: NEGATIVE

## 2023-06-23 LAB — GROUP A STREP BY PCR: Group A Strep by PCR: NOT DETECTED

## 2023-06-23 MED ORDER — ACETAMINOPHEN 325 MG PO TABS
650.0000 mg | ORAL_TABLET | Freq: Once | ORAL | Status: AC | PRN
  Administered 2023-06-23: 650 mg via ORAL
  Filled 2023-06-23: qty 2

## 2023-06-23 MED ORDER — ACETAMINOPHEN 500 MG PO TABS: 500.0000 mg | ORAL_TABLET | Freq: Four times a day (QID) | ORAL | 0 refills | Status: DC | PRN

## 2023-06-23 MED ORDER — OSELTAMIVIR PHOSPHATE 75 MG PO CAPS: 75.0000 mg | ORAL_CAPSULE | Freq: Two times a day (BID) | ORAL | 0 refills | Status: DC

## 2023-06-23 NOTE — Discharge Instructions (Signed)
You have tested positive for influenza A.  This is a viral infection.  Please take Tylenol as needed for fever and bodyaches.  Stay hydrated.  You are within the window to be treated with Tamiflu which is an antiviral medication.  Take medication prescribed.  Return if you have any concern.

## 2023-06-23 NOTE — ED Notes (Signed)
Discharge paperwork given and verbally understood.... Provider aware of the V/S... Pt aware of rechecking Temp in a few hours and taking Tylenol if still elevated.Marland Kitchen

## 2023-06-23 NOTE — ED Triage Notes (Signed)
Throat feels like you have something stuck in it for 2 days. Also having chills,leg and back pain and nausea.

## 2023-06-23 NOTE — ED Provider Notes (Signed)
[facility]PARTMENT AT [facility] Provider Note   CSN: [id number] Arrival date & time: 06/23/23  1528     History  No chief complaint on file.   Susan Roth is a 48 y.o. female.  The history is provided by the patient and medical records. No language interpreter was used.     48 year old female history of anxiety, IBS, GERD presenting with flulike symptoms.  For the past 2 days patient has had fever, chills, body aches, back pain, sore throat, congestion and occasional cough.  No nausea vomiting or diarrhea.  No recent sick contact.  No urinary symptoms.  She tries Mucinex without relief.  Home Medications Prior to Admission medications   Medication Sig Start Date End Date Taking? Authorizing Provider  buPROPion (WELLBUTRIN) 75 MG tablet Take 75 mg by mouth daily.    [provider]  tirzepatide (ZEPBOUND) 12.5 MG/0.5ML Pen Inject 12.5 mg into the skin once a week. 06/04/23   Copland, Gwenlyn Found, MD      Allergies    Amoxicillin, Penicillins, and Wellbutrin [bupropion hcl]    Review of Systems   Review of Systems  All other systems reviewed and are negative.   Physical Exam Updated Vital Signs BP 114/69 (BP Location: Right Arm)   Pulse (!) 108   Temp (!) 100.8 F (38.2 C)   Resp 16   SpO2 96%  Physical Exam Vitals and nursing note reviewed.  Constitutional:      General: She is not in acute distress.    Appearance: She is well-developed.  HENT:     Head: Atraumatic.  Eyes:     Conjunctiva/sclera: Conjunctivae normal.  Cardiovascular:     Rate and Rhythm: Tachycardia present.     Pulses: Normal pulses.     Heart sounds: Normal heart sounds.  Pulmonary:     Effort: Pulmonary effort is normal.     Breath sounds: No wheezing, rhonchi or rales.  Abdominal:     Palpations: Abdomen is soft.     Tenderness: There is no abdominal tenderness.  Musculoskeletal:        General: Normal range of motion.     Cervical back: Neck supple.   Skin:    Capillary Refill: Capillary refill takes less than 2 seconds.     Findings: No rash.  Neurological:     Mental Status: She is alert and oriented to person, place, and time.  Psychiatric:        Mood and Affect: Mood normal.     ED Results / Procedures / Treatments   Labs (all labs ordered are listed, but only abnormal results are displayed) Labs Reviewed  RESP PANEL BY RT-PCR (RSV, FLU A&B, COVID)  RVPGX2 - Abnormal; Notable for the following components:      Result Value   Influenza A by PCR POSITIVE (*)    All other components within normal limits  GROUP A STREP BY PCR    EKG None  Radiology No results found.  Procedures Procedures    Medications Ordered in ED Medications  acetaminophen (TYLENOL) tablet 650 mg (650 mg Oral Given 06/23/23 1548)    ED Course/ Medical Decision Making/ A&P                                 Medical Decision Making Risk OTC drugs.   BP 114/69 (BP Location: Right Arm)   Pulse (!) 108  Temp (!) 100.8 F (38.2 C)   Resp 16   SpO2 96%   68:59 PM   48 year old female history of anxiety, IBS, GERD presenting with flulike symptoms.  For the past 2 days patient has had fever, chills, body aches, back pain, sore throat, congestion and occasional cough.  No nausea vomiting or diarrhea.  No recent sick contact.  No urinary symptoms.  She tries Mucinex without relief.  On exam patient is resting comfortably appears to be in no acute discomfort.  Throat exam remarkable.  Lungs are clear heart is mildly tachycardic and skin is warm to the touch.  Vitals are notable for an oral temperature of 100.8, Tylenol given.  Mild tachycardia with heart rate of 108 likely due to fever.  No hypoxia no hypotension.  Labs obtained imaging review interpreted by me and is positive for influenza A which is consistent with patient presentation.  Chest x-ray considered but not performed as patient is not hypoxic and lungs are clear.  DDx: COVID, flu,  RSV, kidney stone, strain, sprain, UTI  Will provide patient with supportive care and return precaution given.  Patient otherwise stable for discharge.  Possible admission considered but patient is stable enough to go home.          Final Clinical Impression(s) / ED Diagnoses Final diagnoses:  Influenza A    Rx / DC Orders ED Discharge Orders          Ordered    acetaminophen (TYLENOL) 500 MG tablet  Every 6 hours PRN        06/23/23 1806    oseltamivir (TAMIFLU) 75 MG capsule  Every 12 hours        06/23/23 1806              Fayrene Helper, PA-C 06/23/23 1808    Anders Simmonds T, DO 06/25/23 [phone]

## 2023-06-25 ENCOUNTER — Other Ambulatory Visit (HOSPITAL_BASED_OUTPATIENT_CLINIC_OR_DEPARTMENT_OTHER): Payer: Self-pay

## 2023-06-25 ENCOUNTER — Ambulatory Visit: Payer: Self-pay | Admitting: Family Medicine

## 2023-06-25 ENCOUNTER — Encounter: Payer: Self-pay | Admitting: Physician Assistant

## 2023-06-25 ENCOUNTER — Ambulatory Visit: Payer: 59 | Admitting: Physician Assistant

## 2023-06-25 VITALS — BP 132/81 | HR 108 | Temp 98.2°F | Ht 64.0 in | Wt 190.5 lb

## 2023-06-25 DIAGNOSIS — J101 Influenza due to other identified influenza virus with other respiratory manifestations: Secondary | ICD-10-CM

## 2023-06-25 MED ORDER — BENZONATATE 100 MG PO CAPS
100.0000 mg | ORAL_CAPSULE | Freq: Two times a day (BID) | ORAL | 0 refills | Status: DC | PRN
Start: 2023-06-25 — End: 2023-08-12
  Filled 2023-06-25: qty 20, 10d supply, fill #0

## 2023-06-25 MED ORDER — PREDNISONE 20 MG PO TABS
20.0000 mg | ORAL_TABLET | Freq: Every day | ORAL | 0 refills | Status: DC
Start: 2023-06-25 — End: 2023-07-03
  Filled 2023-06-25: qty 5, 5d supply, fill #0

## 2023-06-25 NOTE — Progress Notes (Signed)
 Established patient visit   Patient: Susan Roth   DOB: Aug 28, 1975   48 y.o. Female  MRN: [id number] Visit Date: 06/25/2023  Today's healthcare provider: Manuelita Flatness, PA-C   Chief Complaint  Patient presents with   Follow-up    Patient seen in ED 2/2... Flu +, not feeling better- harder to breathe. Has not started meds because pharmacy does not have them.   Subjective     [name] was seen in the ED 2/2 dx with Flu A, given tamiflu .  Today she reports not feeling improved, hearing 'whistling' at night when she breaths, some SOB. Not sleeping well. She has not been able to take the tamiflu  prescribed as her pharmacy was out. She has been taking tylenol  cold and flu over the counter.  Medications: Outpatient Medications Prior to Visit  Medication Sig   buPROPion  (WELLBUTRIN ) 75 MG tablet Take 75 mg by mouth daily.   tirzepatide  (ZEPBOUND ) 12.5 MG/0.5ML Pen Inject 12.5 mg into the skin once a week.   acetaminophen  (TYLENOL ) 500 MG tablet Take 1 tablet (500 mg total) by mouth every 6 (six) hours as needed.   oseltamivir  (TAMIFLU ) 75 MG capsule Take 1 capsule (75 mg total) by mouth every 12 (twelve) hours.   No facility-administered medications prior to visit.    Review of Systems  Constitutional:  Positive for fatigue and fever.  HENT:  Positive for congestion and sinus pressure.   Respiratory:  Positive for cough and wheezing. Negative for shortness of breath.   Cardiovascular:  Negative for chest pain and leg swelling.  Gastrointestinal:  Negative for abdominal pain.  Neurological:  Negative for dizziness and headaches.       Objective    BP 132/81   Pulse (!) 108   Temp 98.2 F (36.8 C) (Oral)   Ht 5' 4 (1.626 m)   Wt 190 lb 8 oz (86.4 kg)   SpO2 97%   BMI 32.70 kg/m    Physical Exam Constitutional:      General: She is awake.     Appearance: She is well-developed.  HENT:     Head: Normocephalic.  Eyes:     Conjunctiva/sclera: Conjunctivae  normal.  Cardiovascular:     Rate and Rhythm: Normal rate and regular rhythm.     Heart sounds: Normal heart sounds.  Pulmonary:     Effort: Pulmonary effort is normal.     Breath sounds: Normal breath sounds. No wheezing, rhonchi or rales.  Skin:    General: Skin is warm.  Neurological:     Mental Status: She is alert and oriented to person, place, and time.  Psychiatric:        Attention and Perception: Attention normal.        Mood and Affect: Mood normal.        Speech: Speech normal.        Behavior: Behavior is cooperative.      No results found for any visits on 06/25/23.  Assessment & Plan    Influenza A -     predniSONE ; Take 1 tablet (20 mg total) by mouth daily with breakfast.  Dispense: 5 tablet; Refill: 0 -     Benzonatate ; Take 1 capsule (100 mg total) by mouth 2 (two) times daily as needed.  Dispense: 20 capsule; Refill: 0   Rx prednisone  20 x 5 days, advised now, has been 4-5 days she has been sick, limited efficacy of tamiflu .  Rx tessalon  for cough.  Any  worsening symptoms to call office for further recommendations  Return if symptoms worsen or fail to improve.       Manuelita Flatness, PA-C  [facility] [phone] (phone) [phone] (fax)  [facility]

## 2023-06-25 NOTE — Telephone Encounter (Signed)
 Chief Complaint: breathing issues Symptoms: cough, cold sweats, fever Frequency: Sunday Pertinent Negatives: Patient denies chest pain, severe difficulty breathing, current fever Disposition: [] ED /[] Urgent Care (no appt availability in office) / [x] Appointment(In office/virtual)/ []  [facility]/ [] Home Care/ [] Refused Recommended Disposition /[] [facility]/ []  Follow-up with PCP Additional Notes: Patient c/o some difficulty breathing, noticed last night while sleeping. Reports she feels rattling in her chest with cough and cold sweats at night. Denies current fever, chest pain. Reports going to hospital Sunday and was positive for Flu. Scheduled patient per protocol on 06/25/2023. Patient verbalized understanding and to call back/911 with worsening symptoms.    Copied from CRM [id number]. Topic: Clinical - Red Word Triage >> Jun 25, 2023  8:26 AM Macario HERO wrote: Red Word that prompted transfer to Nurse Triage: Patient stated she's been sick with the flu and now having issues breathing. Reason for Disposition  [1] MILD difficulty breathing (e.g., minimal/no SOB at rest, SOB with walking, pulse <100) AND [2] NEW-onset or WORSE than normal  Answer Assessment - Initial Assessment Questions 1. RESPIRATORY STATUS: Describe your breathing? (e.g., wheezing, shortness of breath, unable to speak, severe coughing)      When I try to sleep, I hear a rattling like I'm trying to catch my breath, like something is in there rattling around. I feel like I'm having trouble breathing when I'm talking 2. ONSET: When did this breathing problem begin?      Last night 3. PATTERN Does the difficult breathing come and go, or has it been constant since it started?      intermittent 4. SEVERITY: How bad is your breathing? (e.g., mild, moderate, severe)    - MILD: No SOB at rest, mild SOB with walking, speaks normally in sentences, can lie down, no retractions, pulse < 100.    -  MODERATE: SOB at rest, SOB with minimal exertion and prefers to sit, cannot lie down flat, speaks in phrases, mild retractions, audible wheezing, pulse 100-120.    - SEVERE: Very SOB at rest, speaks in single words, struggling to breathe, sitting hunched forward, retractions, pulse > 120      Mild-moderate - of note, triager appreciates patient speaking in full sentences, answering questions appropriately. 5. RECURRENT SYMPTOM: Have you had difficulty breathing before? If Yes, ask: When was the last time? and What happened that time?      no 6. CARDIAC HISTORY: Do you have any history of heart disease? (e.g., heart attack, angina, bypass surgery, angioplasty)      no 7. LUNG HISTORY: Do you have any history of lung disease?  (e.g., pulmonary embolus, asthma, emphysema)     no 8. CAUSE: What do you think is causing the breathing problem?      Tested Flu + yesterday at ED 9. OTHER SYMPTOMS: Do you have any other symptoms? (e.g., dizziness, runny nose, cough, chest pain, fever)     Cough, cold sweats, fever - resolved 10. O2 SATURATION MONITOR:  Do you use an oxygen saturation monitor (pulse oximeter) at home? If Yes, ask: What is your reading (oxygen level) today? What is your usual oxygen saturation reading? (e.g., 95%)       no  12. TRAVEL: Have you traveled out of the country in the last month? (e.g., travel history, exposures)       no  Answer Assessment - Initial Assessment Questions 1. WORST SYMPTOM: What is your worst symptom? (e.g., cough, runny nose, muscle aches, headache, sore throat, fever)  Cough, rattly breathing 2. ONSET: When did your flu symptoms start?      Sunday   8. HIGH RISK DISEASE: Do you have any chronic medical problems? (e.g., heart or lung disease, asthma, weak immune system, or other HIGH RISK conditions)     no  Protocols used: Breathing Difficulty-A-AH, Influenza (Flu) - Seasonal-A-AH

## 2023-06-27 ENCOUNTER — Encounter: Payer: Self-pay | Admitting: Gastroenterology

## 2023-06-28 ENCOUNTER — Ambulatory Visit: Payer: 59 | Admitting: Physician Assistant

## 2023-06-30 NOTE — Progress Notes (Signed)
[facility] [address] [place], [place] [address] [phone]  Date:  07/03/2023   Name:  Susan Roth   DOB:  12-Oct-1975   MRN:  [id number]  PCP:  Pearline Cables, MD    Chief Complaint: ER follow up (Pt was seen at ER on 06/23/23: Flu A. Seen by Lillia Abed on 06/25/23- Rx Prednisone and Tessalon. She was then seen at [facility] urgent care on 06/29/23 for UTT Rx: keflex. )   History of Present Illness:  Susan Roth is a 48 y.o. very pleasant female patient who presents with the following:  Patient seen today for follow-up from recent ER visit I saw her most recently in October.  She has family history of Lynch syndrome and some genetic mutations herself, also history of IBS  She was seen in the ER on February 2 with concern of feeling ill, diagnosed with influenza A She was started on Tamiflu and followed-up with my partner Alfredia Ferguson 2 days later However, she was not able to get Tamiflu after her ER visit due to supply issues.  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   Low back pain 12/06/2014   Paresthesia 02/09/2013   IBS (irritable bowel syndrome) 11/13/2012   Admission for sterilization 08/04/2012   Dysmenorrhea 08/04/2012   Obesity 06/25/2012   Urticaria, 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   Anxiety and depression 09/26/2010   ACNE, MILD 09/27/2008    Herpes zoster 09/17/2008    Past Medical History:  Diagnosis Date   Abnormal Pap smear of cervix    cryo   Allergic rhinitis    Allergy    Constipation    is every day- pt off Linzess    Dysfunction of eustachian tube    Endometriosis    GERD (gastroesophageal reflux disease)    occasional   Herpes zoster without mention of complication    History of kidney stones    IBS (irritable bowel syndrome)    Neuropathy    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    Obesity     Past Surgical History:  Procedure Laterality Date   BILATERAL SALPINGECTOMY Right 08/04/2012   Procedure: BILATERAL SALPINGECTOMY;  Surgeon: Allie Bossier, MD;  Location: [facility];  Service: Gynecology;  Laterality: Right;   CESAREAN SECTION  1998   x1   CHOLECYSTECTOMY N/A 01/30/2018   Procedure: LAPAROSCOPIC CHOLECYSTECTOMY;  Surgeon: Gaynelle Adu, MD;  Location: [facility];  Service: General;  Laterality: N/A;   COLONOSCOPY     LAPAROSCOPIC TUBAL LIGATION N/A 08/04/2012   Procedure: LAPAROSCOPIC TUBAL LIGATION;  Surgeon: Allie Bossier, MD;  Location: [facility];  Service: Gynecology;  Laterality: N/A;  Operative lsc for right salpingectomy & Mirena insertion- pt had a  bilat salphingectomy NOT tubal    TONSILLECTOMY  as an adult - 2011   WISDOM TOOTH EXTRACTION      Social History   Tobacco Use   Smoking status: Never   Smokeless tobacco: Never   Tobacco comments:    positive hx of passive tobacco smoke exposure  Vaping Use   Vaping status: Never Used  Substance Use Topics   Alcohol use: Never    Alcohol/week: 1.0 standard drink of alcohol    Types: 1 Glasses of wine per week    Comment: occasionally   Drug use: No    Family History  Problem Relation Age of Onset   Hyperlipidemia Mother    Colon polyps Mother    Nephrolithiasis Mother    Diverticulosis Mother    Nephrolithiasis Father    Depression Father        bipolar   Colon cancer Father 70       Lynch   Bladder Cancer Father 62    Asthma Sister    Colon polyps Sister        paternal half sister; Lynch syndrome   Asthma Sister    Allergies Sister    Allergies Brother    Clotting disorder Maternal Uncle    Colon cancer Paternal Aunt 30   Diabetes Paternal Aunt    Colon cancer Paternal Uncle 69   Colon cancer Paternal Uncle 53   Ovarian cancer Maternal Grandmother    Colon cancer Other        double great uncle from both mom and dads side   Esophageal cancer Neg Hx    Rectal cancer Neg Hx    Stomach cancer Neg Hx     Allergies  Allergen Reactions   Amoxicillin Hives   Penicillins Hives    Has patient had a PCN reaction causing immediate rash, facial/tongue/throat swelling, SOB or lightheadedness with hypotension: No Has patient had a PCN reaction causing severe rash involving mucus membranes or skin necrosis: Yes Has patient had a PCN reaction that required hospitalization: No Has patient had a PCN reaction occurring within the last 10 years: No If all of the above answers are "NO", then may proceed with Cephalosporin use.    Wellbutrin [Bupropion Hcl] Hives    Medication list has been reviewed and updated.  Current Outpatient Medications on File Prior to Visit  Medication Sig Dispense Refill   acetaminophen (TYLENOL) 500 MG tablet Take 1 tablet (500 mg total) by mouth every 6 (six) hours as needed. 30 tablet 0   benzonatate (TESSALON) 100 MG capsule Take 1 capsule (100 mg total) by mouth 2 (two) times daily as needed. 20 capsule 0   cephALEXin (KEFLEX) 500 MG capsule Take 500 mg by mouth 2 (two) times daily.     fluconazole (DIFLUCAN) 150 MG tablet Take 150 mg by mouth once.     tirzepatide (ZEPBOUND) 12.5 MG/0.5ML Pen Inject 12.5 mg into the skin once a week. 2 mL 0   No current facility-administered medications on file prior to visit.    Review of Systems:  As per HPI- otherwise negative.   Physical Examination: Vitals:   07/03/23 0855  BP: 118/80  Pulse: 92  Resp: 18  Temp: 98.1 F  (36.7 C)  SpO2: 99%   Vitals:   07/03/23 0855  Weight: 198 lb (89.8 kg)  Height: 5\' 4"  (1.626 m)   Body mass index is 33.99 kg/m. Ideal Body Weight: Weight in (lb) to have BMI = 25: 145.3  GEN: no acute distress.  Mildly obese, looks well  HEENT: Atraumatic, Normocephalic.  Bilateral TM wnl, oropharynx normal.  PEERL,EOMI.   Ears and Nose: No external deformity. CV: RRR, No M/G/R. No JVD. No thrill. No extra heart sounds. PULM: CTA B, no wheezes, crackles, rhonchi. No retractions. No resp. distress. No accessory muscle use. ABD: S, NT, ND, +BS. No rebound. No HSM. EXTR: No c/c/e PSYCH: Normally interactive. Conversant.    Assessment and Plan: Dysuria - Plan: Urine Culture  Cough, unspecified type  Class 2 obesity due to excess calories with body mass index (BMI) of 37.0 to 37.9 in adult, unspecified whether serious comorbidity present - Plan: tirzepatide (ZEPBOUND) 7.5 MG/0.5ML Pen  Patient seen today for follow-up.  She was seen in the ER 10 days ago and diagnosed with flu Overall she is feeling better, but notes she is still just coughing.  I advised her it is normal to cough for a few weeks after influenza.  If she starts to feel bad again please let me know.  Otherwise I would expect her cough to gradually improve  Urine culture collected today as we are not sure if this was done at urgent care  Loni has been using 12.5 mg of Zepbound.  However with her recent illness and timing of her shots that she has missed 3 doses.  She wonders if she should start back on 12.5 mg.  I advised would be more ideal to start back on a lower dose for a month, send in a prescription for 7.5 to avoid abruptly going back to 12.5 mg.  She will use 7.5 for a month and then return to her usual dosage   Signed Abbe Amsterdam, MD

## 2023-07-03 ENCOUNTER — Ambulatory Visit: Payer: 59 | Admitting: Family Medicine

## 2023-07-03 VITALS — BP 118/80 | HR 92 | Temp 98.1°F | Resp 18 | Ht 64.0 in | Wt 198.0 lb

## 2023-07-03 DIAGNOSIS — E66812 Obesity, class 2: Secondary | ICD-10-CM

## 2023-07-03 DIAGNOSIS — R059 Cough, unspecified: Secondary | ICD-10-CM

## 2023-07-03 DIAGNOSIS — R300 Dysuria: Secondary | ICD-10-CM | POA: Diagnosis not present

## 2023-07-03 DIAGNOSIS — E6609 Other obesity due to excess calories: Secondary | ICD-10-CM

## 2023-07-03 DIAGNOSIS — Z6837 Body mass index (BMI) 37.0-37.9, adult: Secondary | ICD-10-CM

## 2023-07-03 MED ORDER — ZEPBOUND 7.5 MG/0.5ML ~~LOC~~ SOAJ: 7.5000 mg | SUBCUTANEOUS | 0 refills | Status: DC

## 2023-07-03 NOTE — Patient Instructions (Signed)
Good to see you today- I am sorry you have been sick!   I will be in touch with your urine culture It is typical to cough for a few weeks after the flu; as long as you otherwise feel ok you don't need to worry Delsym  Cepacol lozenges Can help!    If the cough does not clear up by March let me know

## 2023-07-10 ENCOUNTER — Encounter: Payer: Self-pay | Admitting: Family Medicine

## 2023-07-10 MED ORDER — VALACYCLOVIR HCL 1 G PO TABS: ORAL_TABLET | ORAL | 1 refills | Status: DC

## 2023-07-28 ENCOUNTER — Other Ambulatory Visit: Payer: Self-pay | Admitting: Family Medicine

## 2023-07-28 DIAGNOSIS — E6609 Other obesity due to excess calories: Secondary | ICD-10-CM

## 2023-07-29 MED ORDER — ZEPBOUND 12.5 MG/0.5ML ~~LOC~~ SOAJ: 12.5000 mg | SUBCUTANEOUS | 2 refills | Status: DC

## 2023-08-12 ENCOUNTER — Encounter: Payer: Self-pay | Admitting: Internal Medicine

## 2023-08-12 ENCOUNTER — Ambulatory Visit: Payer: 59 | Admitting: Gastroenterology

## 2023-08-12 ENCOUNTER — Encounter: Payer: Self-pay | Admitting: Gastroenterology

## 2023-08-12 VITALS — BP 120/70 | HR 83 | Ht 64.0 in | Wt 204.0 lb

## 2023-08-12 DIAGNOSIS — Z1509 Genetic susceptibility to other malignant neoplasm: Secondary | ICD-10-CM

## 2023-08-12 DIAGNOSIS — K625 Hemorrhage of anus and rectum: Secondary | ICD-10-CM | POA: Diagnosis not present

## 2023-08-12 DIAGNOSIS — K5904 Chronic idiopathic constipation: Secondary | ICD-10-CM

## 2023-08-12 DIAGNOSIS — K219 Gastro-esophageal reflux disease without esophagitis: Secondary | ICD-10-CM | POA: Diagnosis not present

## 2023-08-12 DIAGNOSIS — R112 Nausea with vomiting, unspecified: Secondary | ICD-10-CM | POA: Diagnosis not present

## 2023-08-12 DIAGNOSIS — Z800 Family history of malignant neoplasm of digestive organs: Secondary | ICD-10-CM

## 2023-08-12 MED ORDER — SUFLAVE 178.7 G PO SOLR: 1.0000 | Freq: Once | ORAL | 0 refills | Status: AC

## 2023-08-12 MED ORDER — SUFLAVE 178.7 G PO SOLR: 1.0000 | Freq: Once | ORAL | 0 refills | Status: DC

## 2023-08-12 MED ORDER — LINACLOTIDE 145 MCG PO CAPS: 145.0000 ug | ORAL_CAPSULE | Freq: Every day | ORAL | 0 refills | Status: DC

## 2023-08-12 NOTE — Patient Instructions (Addendum)
 Recommend OTC Pepcid as needed for reflux Recommend GERD diet, no late meals 3-4 hours prior to lying down.  Provided samples of Linzess take 1 tablet 30-45 minutes before first meal of the day. Please let the office know if it is effective and you want prescription.  You have been scheduled for an endoscopy and colonoscopy. Please follow the written instructions given to you at your visit today.  If you use inhalers (even only as needed), please bring them with you on the day of your procedure.  DO NOT TAKE 7 DAYS PRIOR TO TEST- Trulicity (dulaglutide) Ozempic, Wegovy (semaglutide) Mounjaro (tirzepatide) Bydureon Bcise (exanatide extended release)  DO NOT TAKE 1 DAY PRIOR TO YOUR TEST Rybelsus (semaglutide) Adlyxin (lixisenatide) Victoza (liraglutide) Byetta (exanatide) ______________________________________________________________________  Bonita Quin will receive your bowel preparation through [facility], which ensures the lowest copay and home delivery, with outreach via text or call from an [phone] number. Please respond promptly to avoid rescheduling of your procedure. If you are interested in alternative options or have any questions regarding your prep, please contact them at [phone] _______________________________________________________________________  Your Provider Has Sent Your Bowel Prep Regimen To [facility]   [facility] will contact you to verify your information and collect your copay, if applicable. Enjoy the comfort of your home while your prescription is mailed to you, FREE of any shipping charges.   [facility] accepts all major insurance benefits and applies discounts & coupons.  Have additional questions?   Chat: [email or web address] Call: [phone] Email: [email or web address] NCPDP: [id number]  How will [facility] contact you?  With a Welcome phone call,  a Welcome text and a checkout link in text form.  Texts you receive from [phone] Are NOT  Spam.  *To set up delivery, you must complete the checkout process via link or speak to one of the patient care representatives. If [facility] is unable to reach you, your prescription may be delayed.  To avoid long hold times on the phone, you may also utilize the secure chat feature on the [facility] website to request that they call you back for transaction completion or to expedite your concerns.   BOWEL PURGE:   Purchase 1 (one) 119 GRAM bottle of Miralax Purchase 1 (one) 32 ounce bottle of Gatorade   STEPS:   Mix the entire bottle of Miralax in the 32 ounces of room temperature Gatorade and stir to dissolve completely. Drink the Miralax solution you have prepared. You will drink this mixture over the next 2-3 hours.  You should expect results within 1 to 6 hours after completing this bowel purge.   Thank you for trusting me with your gastrointestinal care!   Deanna May, NP   _______________________________________________________  If your blood pressure at your visit was 140/90 or greater, please contact your primary care physician to follow up on this.  _______________________________________________________  If you are age 63 or older, your body mass index should be between 23-30. Your Body mass index is 35.02 kg/m. If this is out of the aforementioned range listed, please consider follow up with your Primary Care Provider.  If you are age 53 or younger, your body mass index should be between 19-25. Your Body mass index is 35.02 kg/m. If this is out of the aformentioned range listed, please consider follow up with your Primary Care Provider.   ________________________________________________________  The [facility] providers would like to encourage you to use [facility] to communicate with providers for non-urgent requests or questions.  Due to long hold times on the  telephone, sending your provider a message by [facility] may be a faster and more efficient way to get a  response.  Please allow 48 business hours for a response.  Please remember that this is for non-urgent requests.  _______________________________________________________

## 2023-08-12 NOTE — Progress Notes (Signed)
 Chief Complaint:  MLH1 gene mutation.   Primary GI Doctor:Dr. Leone Payor  HPI:  Patient is a 48 year old female patient with past medical history of anxiety, IBS, GERD, who was referred to me by Copland, Gwenlyn Found, MD on 06/20/23 for MLH1 gene mutation.    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 Specifically, this variant is [id number]. Colorectal cancer, 46-61% risk (average age of diagnosis is 52).  Colorectal Cancer Screening: High quality colonoscopy at age 54-25 or 2-5 years prior to the earliest colon cancer if it is diagnosed before age 45 and repeat every 1-2 years. Upper GI surveillance with esophagogastroduodenoscopy (EGD) starting at age 60-40 and repeat every 2-4 years, preferably performed in conjunction with colonoscopy. Random biopsy of the proximal and distal stomach should at minimum be performed on the initial procedure to assess for H. pylori, autoimmune gastritis, and intestinal metaplasia.   On 06/23/2023 patient seen in ED for flulike symptoms.  Positive for influenza A.  Interval History     Patient presents for follow-up per her genetic counselor for MLH1 gene mutation to schedule upper endoscopy and colonoscopy.  She does endorse having some gastrointestinal issues since she started Zepbound about 5 months ago.  Patient has history of reflux but states it is only on occasion and not significant enough in frequency to take any daily PPI therapy.  Patient denies dysphagia. Patient reports she also has nausea with vomiting, she feels happens weekly after her Zepbound shot.     She has history of chronic idiopathic constipation and in the past was on Linzess (unsure dose). She states she has bowel movement on average every 2-3 days, but since starting Zepbound the frequency has decreased. She has intermittent episodes of BRB with wiping only, she contributes to hemorrhoids.   Drinks  socially, 1 glass of wine a week. No tobacco use. No NSAID's.  Patient's family history includes Lynch Syndrome in father and half- sister - has multiple close and distant relatives with colon cancer.  Wt Readings from Last 3 Encounters:  08/12/23 204 lb (92.5 kg)  07/03/23 198 lb (89.8 kg)  06/25/23 190 lb 8 oz (86.4 kg)    Past Medical History:  Diagnosis Date   Abnormal Pap smear of cervix    cryo   Allergic rhinitis    Allergy    Constipation    is every day- pt off Linzess    Dysfunction of eustachian tube    Endometriosis    GERD (gastroesophageal reflux disease)    occasional   Herpes zoster without mention of complication    History of kidney stones    IBS (irritable bowel syndrome)    Neuropathy    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    Obesity     Past Surgical History:  Procedure Laterality Date   BILATERAL SALPINGECTOMY Right 08/04/2012   Procedure: BILATERAL SALPINGECTOMY;  Surgeon: Allie Bossier, MD;  Location: [facility];  Service: Gynecology;  Laterality: Right;   CESAREAN SECTION  1998   x1   CHOLECYSTECTOMY N/A 01/30/2018   Procedure: LAPAROSCOPIC CHOLECYSTECTOMY;  Surgeon: Gaynelle Adu, MD;  Location: [facility];  Service: General;  Laterality: N/A;   COLONOSCOPY     LAPAROSCOPIC TUBAL LIGATION N/A 08/04/2012   Procedure: LAPAROSCOPIC TUBAL LIGATION;  Surgeon: Allie Bossier, MD;  Location: [facility];  Service: Gynecology;  Laterality: N/A;  Operative lsc for right  salpingectomy & Mirena insertion- pt had a  bilat salphingectomy NOT tubal    TONSILLECTOMY     as an adult - 2011   WISDOM TOOTH EXTRACTION      Current Outpatient Medications  Medication Sig Dispense Refill   tirzepatide (ZEPBOUND) 7.5 MG/0.5ML Pen Inject 7.5 mg into the skin once a week.     No current facility-administered medications for this visit.    Allergies as of 08/12/2023 - Review Complete 08/12/2023  Allergen Reaction Noted   Amoxicillin Hives    Penicillins Hives    Wellbutrin  [bupropion hcl] Hives 09/26/2010    Family History  Problem Relation Age of Onset   Hyperlipidemia Mother    Colon polyps Mother    Nephrolithiasis Mother    Diverticulosis Mother    Nephrolithiasis Father    Depression Father        bipolar   Colon cancer Father 89       Lynch   Bladder Cancer Father 17   Asthma Sister    Colon polyps Sister        paternal half sister; Lynch syndrome   Asthma Sister    Allergies Sister    Allergies Brother    Clotting disorder Maternal Uncle    Colon cancer Paternal Aunt 30   Diabetes Paternal Aunt    Colon cancer Paternal Uncle 28   Colon cancer Paternal Uncle 48   Ovarian cancer Maternal Grandmother    Colon cancer Other        double great uncle from both mom and dads side   Esophageal cancer Neg Hx    Rectal cancer Neg Hx    Stomach cancer Neg Hx     Review of Systems:    Constitutional: No weight loss, fever, chills, weakness or fatigue HEENT: Eyes: No change in vision               Ears, Nose, Throat:  No change in hearing or congestion Skin: No rash or itching Cardiovascular: No chest pain, chest pressure or palpitations   Respiratory: No SOB or cough Gastrointestinal: See HPI and otherwise negative Genitourinary: No dysuria or change in urinary frequency Neurological: No headache, dizziness or syncope Musculoskeletal: No new muscle or joint pain Hematologic: No bleeding or bruising Psychiatric: No history of depression or anxiety    Physical Exam:  Vital signs: BP 120/70   Pulse 83   Ht 5\' 4"  (1.626 m)   Wt 204 lb (92.5 kg)   BMI 35.02 kg/m   Constitutional: Pleasant  female appears to be in NAD, Well developed, Well nourished, alert and cooperative Throat: Oral cavity and pharynx without inflammation, swelling or lesion.  Respiratory: Respirations even and unlabored. Lungs clear to auscultation bilaterally.   No wheezes, crackles, or rhonchi.  Cardiovascular: Normal S1, S2. Regular rate and rhythm. No peripheral  edema, cyanosis or pallor.  Gastrointestinal:  Soft, nondistended, nontender. No rebound or guarding. Normal bowel sounds. No appreciable masses or hepatomegaly. Rectal:  Not performed.  Msk:  Symmetrical without gross deformities. Without edema, no deformity or joint abnormality.  Neurologic:  Alert and  oriented x4;  grossly normal neurologically.  Skin:   Dry and intact without significant lesions or rashes. Psychiatric: Oriented to person, place and time. Demonstrates good judgement and reason without abnormal affect or behaviors.  RELEVANT LABS AND IMAGING: CBC    Latest Ref Rng & Units 01/04/2023    6:25 AM 12/26/2022    3:47 PM 10/13/2021  5:50 PM  CBC  WBC 4.0 - 10.5 K/uL 5.0  5.0  5.8   Hemoglobin 12.0 - 15.0 g/dL 62.9  52.8  41.3   Hematocrit 36.0 - 46.0 % 41.5  43.6  42.6   Platelets 150 - 400 K/uL 337  384.0  301      CMP     Latest Ref Rng & Units 01/04/2023    6:25 AM 12/26/2022    3:47 PM 11/27/2021    4:23 PM  CMP  Glucose 70 - 99 mg/dL 92  90  88   BUN 6 - 20 mg/dL 15  12  11    Creatinine 0.44 - 1.00 mg/dL 2.44  0.10  2.72   Sodium 135 - 145 mmol/L 135  135  134   Potassium 3.5 - 5.1 mmol/L 4.3  4.2  4.2   Chloride 98 - 111 mmol/L 99  100  100   CO2 22 - 32 mmol/L 29  27  27    Calcium 8.9 - 10.3 mg/dL 9.3  9.3  9.0   Total Protein 6.0 - 8.3 g/dL  7.3    Total Bilirubin 0.2 - 1.2 mg/dL  0.4    Alkaline Phos 39 - 117 U/L  60    AST 0 - 37 U/L  13    ALT 0 - 35 U/L  11       Lab Results  Component Value Date   TSH 2.27 12/26/2022   11/05/18 colonoscopy with Dr. Leone Payor, recall 11/22 Impression:  - The entire examined colon is normal on direct and retroflexion views.  - No specimens collected. 12/14/09 colonoscopy with Dr. Leone Payor  Endoscopic impression: Normal colonoscopy excellent prep  Assessment: Encounter Diagnoses  Name Primary?   MLH1-related Lynch syndrome (HNPCC2) Yes   Rectal bleeding    Family history of colon cancer    Family history of  Lynch syndrome    Nausea and vomiting, unspecified vomiting type    Chronic idiopathic constipation    Gastroesophageal reflux disease, unspecified whether esophagitis present      48 year old female patient with family history of Lynch syndrome and MLH1 genetic mutation with recommendations for colonoscopy and upper GI endoscopy.  Will schedule both today in [facility] with Dr. Leone Payor.Random biopsy of the proximal and distal stomach should at minimum be performed on the initial procedure to assess for H. pylori, autoimmune gastritis, and intestinal metaplasia.   Patient will need extra prep due to recent issues with worsening constipation.  Provided her with samples of Linzess 145 mcg p.o. daily.  For the occasional reflux we discussed using over-the-counter Pepcid as needed and reinforced GERD diet with no late meals.  Plan: - Recommend OTC Pepcid as needed - Recommend GERD diet, no late meals 3-4 hours prior to lying down  - Samples of Linzess 145 mcg po daily provided. -Schedule EGD in [facility] with Dr. Leone Payor. The risks and benefits of EGD with possible biopsies were discussed with the patient who agrees to proceed. - Schedule for a colonoscopy with extra prep in [facility] with Dr. Leone Payor. The risks and benefits of colonoscopy with possible polypectomy / biopsies were discussed and the patient agrees to proceed.    Thank you for the courtesy of this consult. Please call me with any questions or concerns.   Sudie Bandel, FNP-C [facility] 08/12/2023, 9:13 AM  Cc: Copland, Gwenlyn Found, MD

## 2023-08-12 NOTE — Addendum Note (Signed)
 Addended by: Myrtie Hawk on: 08/12/2023 10:54 AM   Modules accepted: Orders

## 2023-08-19 ENCOUNTER — Other Ambulatory Visit: Payer: Self-pay

## 2023-08-19 DIAGNOSIS — K5904 Chronic idiopathic constipation: Secondary | ICD-10-CM

## 2023-08-19 DIAGNOSIS — Z800 Family history of malignant neoplasm of digestive organs: Secondary | ICD-10-CM

## 2023-08-19 MED ORDER — NA SULFATE-K SULFATE-MG SULF 17.5-3.13-1.6 GM/177ML PO SOLN: 1.0000 | Freq: Once | ORAL | 0 refills | Status: AC

## 2023-08-19 NOTE — Telephone Encounter (Signed)
 Pt stated that she has had the run around with [facility] and still has not received her prep ( Suflave). Pt colonoscopy is tomorrow. Pt stated that she did start taking the Miralax yesterday. Please advise if recommendations are for pt to use Miralax prep starting today and disregard Suflave.

## 2023-08-19 NOTE — Telephone Encounter (Signed)
 Pt made aware of Dr. Leone Payor recommendations: Prescription for suprep sent to pharmacy. Pt made aware. Updated my chart instructions sent to pt. Pt made aware. Pt verbalized understanding with all questions answered.

## 2023-08-20 ENCOUNTER — Encounter: Payer: Self-pay | Admitting: Internal Medicine

## 2023-08-20 ENCOUNTER — Ambulatory Visit (AMBULATORY_SURGERY_CENTER): Admitting: Internal Medicine

## 2023-08-20 VITALS — BP 130/78 | HR 70 | Temp 97.7°F | Resp 15 | Ht 64.0 in | Wt 204.0 lb

## 2023-08-20 DIAGNOSIS — K648 Other hemorrhoids: Secondary | ICD-10-CM

## 2023-08-20 DIAGNOSIS — K2970 Gastritis, unspecified, without bleeding: Secondary | ICD-10-CM | POA: Diagnosis not present

## 2023-08-20 DIAGNOSIS — B9681 Helicobacter pylori [H. pylori] as the cause of diseases classified elsewhere: Secondary | ICD-10-CM

## 2023-08-20 DIAGNOSIS — D123 Benign neoplasm of transverse colon: Secondary | ICD-10-CM

## 2023-08-20 DIAGNOSIS — K625 Hemorrhage of anus and rectum: Secondary | ICD-10-CM | POA: Diagnosis not present

## 2023-08-20 DIAGNOSIS — K5730 Diverticulosis of large intestine without perforation or abscess without bleeding: Secondary | ICD-10-CM | POA: Diagnosis not present

## 2023-08-20 DIAGNOSIS — K2950 Unspecified chronic gastritis without bleeding: Secondary | ICD-10-CM | POA: Diagnosis not present

## 2023-08-20 DIAGNOSIS — Z1509 Genetic susceptibility to other malignant neoplasm: Secondary | ICD-10-CM

## 2023-08-20 MED ORDER — LINACLOTIDE 145 MCG PO CAPS: 145.0000 ug | ORAL_CAPSULE | Freq: Every day | ORAL | 3 refills | Status: AC

## 2023-08-20 MED ORDER — SODIUM CHLORIDE 0.9 % IV SOLN: 500.0000 mL | Freq: Once | INTRAVENOUS | Status: DC

## 2023-08-20 NOTE — Progress Notes (Signed)
 Vss nad trans to pacu

## 2023-08-20 NOTE — Progress Notes (Signed)
 History and Physical Interval Note:  08/20/2023 2:12 PM  Susan Roth  has presented today for endoscopic procedure(s), with the diagnosis of  Encounter Diagnoses  Name Primary?   MLH1-related Lynch syndrome (HNPCC2) Yes   Rectal bleeding   .  The various methods of evaluation and treatment have been discussed with the patient and/or family. After consideration of risks, benefits and other options for treatment, the patient has consented to  the endoscopic procedure(s).   The patient's history has been reviewed, patient examined, no change in status, stable for endoscopic procedure(s).  I have reviewed the patient's chart and labs.  Questions were answered to the patient's satisfaction.     Iva Boop, MD, [facility]

## 2023-08-20 NOTE — Patient Instructions (Addendum)
 The upper endoscopy showed some red color change in the stomach which does not necessarily mean anything wrong. Standard biopsies taken of the stomach lining.  There was a small colon polyp removed. Small hemorrhoids and diverticulosis seen.  I will review biopsies and decide on next upper endoscopy.  Colonoscopy in 1 year due to Lynch Syndrome.  I sent a prescription for Linzess to pharmacy - I am not certain that is covered and could be expensive. You can usually find coupons online to help make it cheaper.  I appreciate the opportunity to care for you. Iva Boop, MD, [facility]  Resume previous diet Continue present medications Handouts/information given for chronic constipation, polyps, diverticulosis and hemorrhoids  YOU HAD AN ENDOSCOPIC PROCEDURE TODAY AT THE [facility]:   Refer to the procedure report that was given to you for any specific questions about what was found during the examination.  If the procedure report does not answer your questions, please call your gastroenterologist to clarify.  If you requested that your care partner not be given the details of your procedure findings, then the procedure report has been included in a sealed envelope for you to review at your convenience later.  YOU SHOULD EXPECT: Some feelings of bloating in the abdomen. Passage of more gas than usual.  Walking can help get rid of the air that was put into your GI tract during the procedure and reduce the bloating. If you had a lower endoscopy (such as a colonoscopy or flexible sigmoidoscopy) you may notice spotting of blood in your stool or on the toilet paper. If you underwent a bowel prep for your procedure, you may not have a normal bowel movement for a few days.  SYMPTOMS TO REPORT IMMEDIATELY: Following lower endoscopy (colonoscopy):  Excessive amounts of blood in the stool  Significant tenderness or worsening of abdominal pains  Swelling of the abdomen that is new,  acute  Fever of 100F or higher Following upper endoscopy (EGD)  Vomiting of blood or coffee ground material  New chest pain or pain under the shoulder blades  Painful or persistently difficult swallowing  New shortness of breath  Black, tarry-looking stools For urgent or emergent issues, a gastroenterologist can be reached at any hour by calling [phone]. Do not use MyChart messaging for urgent concerns.    Please Note:  You might notice some irritation and congestion in your nose or some drainage.  This is from the oxygen used during your procedure.  There is no need for concern and it should clear up in a day or so.  DIET:  We do recommend a small meal at first, but then you may proceed to your regular diet.  Drink plenty of fluids but you should avoid alcoholic beverages for 24 hours.  ACTIVITY:  You should plan to take it easy for the rest of today and you should NOT DRIVE or use heavy machinery until tomorrow (because of the sedation medicines used during the test).    FOLLOW UP: Our staff will call the number listed on your records the next business day following your procedure.  We will call around 7:15- 8:00 am to check on you and address any questions or concerns that you may have regarding the information given to you following your procedure. If we do not reach you, we will leave a message.     If any biopsies were taken you will be contacted by phone or by letter within the next 1-3 weeks.  Please call us at [phone] if you have not heard about the biopsies in 3 weeks.    SIGNATURES/CONFIDENTIALITY: You and/or your care partner have signed paperwork which will be entered into your electronic medical record.  These signatures attest to the fact that that the information above on your After Visit Summary has been reviewed and is understood.  Full responsibility of the confidentiality of this discharge information lies with you and/or your care-partner.

## 2023-08-20 NOTE — Op Note (Signed)
 [facility] Patient Name: Susan Roth Procedure Date: 08/20/2023 2:15 PM MRN: [id number] Endoscopist: Iva Boop , MD, [id number] Age: 48 Referring MD:  Date of Birth: Dec 13, 1975 Gender: Female Account #: [id number] Procedure:                Colonoscopy Indications:              Lynch Syndrome Medicines:                Monitored Anesthesia Care Procedure:                Pre-Anesthesia Assessment:                           - Prior to the procedure, a History and Physical                            was performed, and patient medications and                            allergies were reviewed. The patient's tolerance of                            previous anesthesia was also reviewed. The risks                            and benefits of the procedure and the sedation                            options and risks were discussed with the patient.                            All questions were answered, and informed consent                            was obtained. Prior Anticoagulants: The patient has                            taken no anticoagulant or antiplatelet agents. ASA                            Grade Assessment: II - A patient with mild systemic                            disease. After reviewing the risks and benefits,                            the patient was deemed in satisfactory condition to                            undergo the procedure.                           After obtaining informed consent, the colonoscope  was passed under direct vision. Throughout the                            procedure, the patient's blood pressure, pulse, and                            oxygen saturations were monitored continuously. The                            [id number] Colonoscope [id number] was introduced                            through the anus and advanced to the the cecum,                            identified by appendiceal orifice and ileocecal                             valve. The Olympus Scope SN: [id number] was introduced                            through the and advanced to the. The colonoscopy                            was performed without difficulty. The patient                            tolerated the procedure well. The quality of the                            bowel preparation was good. The ileocecal valve,                            appendiceal orifice, and rectum were photographed.                            The bowel preparation used was SUPREP via split                            dose instruction. Scope In: 2:36:06 PM Scope Out: 2:45:12 PM Scope Withdrawal Time: 0 hours 6 minutes 46 seconds  Total Procedure Duration: 0 hours 9 minutes 6 seconds  Findings:                 The perianal and digital rectal examinations were                            normal.                           A 6 mm polyp was found in the hepatic flexure. The                            polyp was sessile. The polyp was removed with a  cold snare. Resection and retrieval were complete.                            Verification of patient identification for the                            specimen was done. Estimated blood loss was minimal.                           Multiple small-mouthed diverticula were found in                            the sigmoid colon.                           Internal hemorrhoids were found. The hemorrhoids                            were small.                           The exam was otherwise without abnormality on                            direct and retroflexion views. Complications:            No immediate complications. Estimated Blood Loss:     Estimated blood loss was minimal. Impression:               - One 6 mm polyp at the hepatic flexure, removed                            with a cold snare. Resected and retrieved.                           - Diverticulosis in the sigmoid colon.                            - Internal hemorrhoids.                           - The examination was otherwise normal on direct                            and retroflexion views. Recommendation:           - Patient has a contact number available for                            emergencies. The signs and symptoms of potential                            delayed complications were discussed with the                            patient. Return to normal activities tomorrow.  Written discharge instructions were provided to the                            patient.                           - Resume previous diet.                           - Continue present medications.                           - Await pathology results.                           - Repeat colonoscopy in 1 year given Lynch Syndrome Iva Boop, MD 08/20/2023 2:59:37 PM This report has been signed electronically.

## 2023-08-20 NOTE — Progress Notes (Signed)
 Called to room to assist during endoscopic procedure.  Patient ID and intended procedure confirmed with present staff. Received instructions for my participation in the procedure from the performing physician.

## 2023-08-20 NOTE — Progress Notes (Signed)
 Pt's states no medical or surgical changes since previsit or office visit.

## 2023-08-20 NOTE — Op Note (Signed)
 [facility] Patient Name: Susan Roth Procedure Date: 08/20/2023 2:16 PM MRN: [id number] Endoscopist: Iva Boop , MD, [id number] Age: 48 Referring MD:  Date of Birth: 15-Mar-1976 Gender: Female Account #: [id number] Procedure:                Upper GI endoscopy Indications:              Hereditary nonpolyposis colorectal cancer (Lynch                            Syndrome) Medicines:                Monitored Anesthesia Care Procedure:                Pre-Anesthesia Assessment:                           - Prior to the procedure, a History and Physical                            was performed, and patient medications and                            allergies were reviewed. The patient's tolerance of                            previous anesthesia was also reviewed. The risks                            and benefits of the procedure and the sedation                            options and risks were discussed with the patient.                            All questions were answered, and informed consent                            was obtained. Prior Anticoagulants: The patient has                            taken no anticoagulant or antiplatelet agents. ASA                            Grade Assessment: II - A patient with mild systemic                            disease. After reviewing the risks and benefits,                            the patient was deemed in satisfactory condition to                            undergo the procedure.  After obtaining informed consent, the endoscope was                            passed under direct vision. Throughout the                            procedure, the patient's blood pressure, pulse, and                            oxygen saturations were monitored continuously. The                            GIF [id number] was introduced through the                            mouth, and advanced to the second part of  duodenum.                            The upper GI endoscopy was accomplished without                            difficulty. The patient tolerated the procedure                            well. Scope In: Scope Out: Findings:                 Patchy mildly erythematous mucosa without bleeding                            was found in the entire examined stomach. Biopsies                            were taken with a cold forceps for histology.                            Verification of patient identification for the                            specimen was done. Estimated blood loss was minimal.                           The exam was otherwise without abnormality.                           The cardia and gastric fundus were normal on                            retroflexion. Complications:            No immediate complications. Estimated Blood Loss:     Estimated blood loss was minimal. Impression:               - Erythematous mucosa in the stomach. Biopsied.  Antral and body bottles each, Sydney prootocol.                           - The examination was otherwise normal. Recommendation:           - Patient has a contact number available for                            emergencies. The signs and symptoms of potential                            delayed complications were discussed with the                            patient. Return to normal activities tomorrow.                            Written discharge instructions were provided to the                            patient.                           - Resume previous diet.                           - Continue present medications.                           - See the other procedure note for documentation of                            additional recommendations. Iva Boop, MD 08/20/2023 2:55:34 PM This report has been signed electronically.

## 2023-08-21 ENCOUNTER — Other Ambulatory Visit: Payer: Self-pay | Admitting: Family Medicine

## 2023-08-21 ENCOUNTER — Telehealth: Payer: Self-pay | Admitting: *Deleted

## 2023-08-21 NOTE — Telephone Encounter (Signed)
 Post procedure follow up call placed, no answer and left VM.

## 2023-08-23 LAB — SURGICAL PATHOLOGY

## 2023-08-27 ENCOUNTER — Encounter: Payer: Self-pay | Admitting: Internal Medicine

## 2023-08-27 ENCOUNTER — Telehealth: Payer: Self-pay | Admitting: Internal Medicine

## 2023-08-27 DIAGNOSIS — Z860101 Personal history of adenomatous and serrated colon polyps: Secondary | ICD-10-CM

## 2023-08-27 DIAGNOSIS — B9681 Helicobacter pylori [H. pylori] as the cause of diseases classified elsewhere: Secondary | ICD-10-CM | POA: Insufficient documentation

## 2023-08-27 HISTORY — DX: Personal history of adenomatous and serrated colon polyps: Z86.0101

## 2023-08-27 NOTE — Telephone Encounter (Signed)
 Please contact the patient and inform her that the biopsies at EGD showed H. pylori gastritis.  The colon polyp was benign but precancerous.  She already knows that she will repeat a colonoscopy in 1 year.  Please place an EGD recall for 2 years.  1) Omeprazole 20 mg 2 times a day x 14 d 2) Pepto Bismol 2 tabs (262 mg each) 4 times a day x 14 d 3) Metronidazole 250 mg 4 times a day x 14 d 4) tetracycline 500 mg 4 times a day x 14 d  After 14 d stop omeprazole also  In 4 weeks after treatment completed do Diatherex H. Pylori stool antigen - dx H. Pylori gastritis

## 2023-08-28 ENCOUNTER — Other Ambulatory Visit: Payer: Self-pay

## 2023-08-28 ENCOUNTER — Other Ambulatory Visit: Payer: Self-pay | Admitting: Internal Medicine

## 2023-08-28 DIAGNOSIS — B9681 Helicobacter pylori [H. pylori] as the cause of diseases classified elsewhere: Secondary | ICD-10-CM

## 2023-08-28 MED ORDER — OMEPRAZOLE 20 MG PO CPDR: 20.0000 mg | DELAYED_RELEASE_CAPSULE | Freq: Two times a day (BID) | ORAL | 0 refills | Status: DC

## 2023-08-28 MED ORDER — BISMUTH SUBSALICYLATE 262 MG PO TABS: 2.0000 | ORAL_TABLET | Freq: Four times a day (QID) | ORAL | 0 refills | Status: AC

## 2023-08-28 MED ORDER — FLUCONAZOLE 150 MG PO TABS: ORAL_TABLET | ORAL | 0 refills | Status: DC

## 2023-08-28 MED ORDER — METRONIDAZOLE 250 MG PO TABS: 250.0000 mg | ORAL_TABLET | Freq: Four times a day (QID) | ORAL | 0 refills | Status: AC

## 2023-08-28 MED ORDER — TETRACYCLINE HCL 500 MG PO CAPS: 500.0000 mg | ORAL_CAPSULE | Freq: Four times a day (QID) | ORAL | 0 refills | Status: AC

## 2023-08-28 NOTE — Telephone Encounter (Signed)
 Patient aware and agrees to plan of care. Pharmacy confirmed. Prescriptions transmitted.

## 2023-08-28 NOTE — Telephone Encounter (Signed)
 Dr Leone Payor is it ok to send diflucan?

## 2023-08-29 NOTE — Telephone Encounter (Signed)
 [employer] test kit to the front desk of the 2nd floor for patient pick up at her convenience.

## 2023-08-30 ENCOUNTER — Other Ambulatory Visit: Payer: Self-pay

## 2023-08-30 ENCOUNTER — Other Ambulatory Visit (HOSPITAL_COMMUNITY): Payer: Self-pay

## 2023-08-30 ENCOUNTER — Telehealth: Payer: Self-pay

## 2023-08-30 NOTE — Telephone Encounter (Signed)
 Pharmacy Patient Advocate Encounter   Received notification from CoverMyMeds that prior authorization for Zepbound 2.5MG /0.5ML pen-injectors is required/requested.   Insurance verification completed.   The patient is insured through [facility] .   Per test claim: PA required; PA submitted to above mentioned insurance via CoverMyMeds Key/confirmation #/EOC [id number] Status is pending

## 2023-08-30 NOTE — Telephone Encounter (Signed)
 Pharmacy Patient Advocate Encounter  Received notification from [facility] that Prior Authorization for Zepbound 2.5MG /0.5ML pen-injectors has been APPROVED from 08/30/23 to 04/30/24   PA #/Case ID/Reference #: [id number]

## 2023-09-23 ENCOUNTER — Encounter: Payer: Self-pay | Admitting: Family Medicine

## 2023-10-05 ENCOUNTER — Encounter: Payer: Self-pay | Admitting: Family Medicine

## 2023-10-05 DIAGNOSIS — N926 Irregular menstruation, unspecified: Secondary | ICD-10-CM

## 2023-10-24 NOTE — Progress Notes (Signed)
 [facility] [address] [place], [place] [address] [phone]  Date:  10/28/2023   Name:  Susan Roth   DOB:  August 15, 1975   MRN:  [id number]  PCP:  Susan Partridge, MD    Chief Complaint: No chief complaint on file.   History of Present Illness:  Susan Roth is a 48 y.o. very pleasant female patient who presents with the following:  Patient seen today with concern of low energy and not feeling well.  Most recent visit with myself was in February She has family history of Lynch syndrome and some genetic mutations herself, also history of IBS  Most recent blood work on chart from August  She does have gastroenterology care-Susan Roth No bowel sx currently   Pt notes she is having episodes of feeling flushed "hot flashes", feeling tired, having headaches, sleeping more  She has noted these sx for a month or so   Her menses are overall still regular  LMP was about 2 weeks ago  She is using zepbound  2.5 mg currently for weight control  She does not feel her stress level has changed Her family is doing well  Wt Readings from Last 3 Encounters:  10/28/23 200 lb 9.6 oz (91 kg)  08/20/23 204 lb (92.5 kg)  08/12/23 204 lb (92.5 kg)     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   Low back pain 12/06/2014   Paresthesia 02/09/2013   IBS (irritable bowel syndrome) 11/13/2012   Admission for sterilization 08/04/2012   Dysmenorrhea 08/04/2012   Obesity 06/25/2012   Urticaria, 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   Anxiety and depression 09/26/2010   ACNE, MILD 09/27/2008   Herpes zoster 09/17/2008    Past  Medical History:  Diagnosis Date   Abnormal Pap smear of cervix    cryo   Allergic rhinitis    Allergy     Constipation    is every day- pt off Linzess     Dysfunction of eustachian tube    Endometriosis    GERD (gastroesophageal reflux disease)    occasional   Herpes zoster without mention of complication    History of kidney stones    Hx of adenomatous polyp of colon 08/27/2023   IBS (irritable bowel syndrome)    Neuropathy    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    Obesity     Past Surgical History:  Procedure Laterality Date   BILATERAL SALPINGECTOMY Right 08/04/2012   Procedure: BILATERAL SALPINGECTOMY;  Surgeon: Susan Balling, MD;  Location: [facility];  Service: Gynecology;  Laterality: Right;   CESAREAN SECTION  1998   x1   CHOLECYSTECTOMY N/A 01/30/2018   Procedure: LAPAROSCOPIC CHOLECYSTECTOMY;  Surgeon: Susan Hummingbird, MD;  Location: [facility];  Service: General;  Laterality: N/A;   COLONOSCOPY     LAPAROSCOPIC TUBAL LIGATION N/A 08/04/2012   Procedure: LAPAROSCOPIC TUBAL LIGATION;  Surgeon: Susan Balling, MD;  Location: [facility];  Service: Gynecology;  Laterality: N/A;  Operative lsc for right salpingectomy & Mirena insertion- pt had a  bilat salphingectomy NOT tubal    TONSILLECTOMY     as an  adult - 2011   WISDOM TOOTH EXTRACTION      Social History   Tobacco Use   Smoking status: Never   Smokeless tobacco: Never   Tobacco comments:    positive hx of passive tobacco smoke exposure  Vaping Use   Vaping status: Never Used  Substance Use Topics   Alcohol use: Yes    Alcohol/week: 1.0 standard drink of alcohol    Types: 1 Glasses of wine per week    Comment: occasionally   Drug use: No    Family History  Problem Relation Age of Onset   Hyperlipidemia Mother    Colon polyps Mother    Nephrolithiasis Mother    Diverticulosis Mother    Nephrolithiasis Father    Depression Father        bipolar   Colon cancer Father 28       Lynch   Bladder Cancer Father 1    Asthma Sister    Colon polyps Sister        paternal half sister; Lynch syndrome   Asthma Sister    Allergies Sister    Allergies Brother    Clotting disorder Maternal Uncle    Colon cancer Paternal Aunt 30   Diabetes Paternal Aunt    Colon cancer Paternal Uncle 76   Colon cancer Paternal Uncle 84   Ovarian cancer Maternal Grandmother    Colon cancer Other        double great uncle from both mom and dads side   Esophageal cancer Neg Hx    Rectal cancer Neg Hx    Stomach cancer Neg Hx     Allergies  Allergen Reactions   Penicillins Hives    Has patient had a PCN reaction causing immediate rash, facial/tongue/throat swelling, SOB or lightheadedness with hypotension: No Has patient had a PCN reaction causing severe rash involving mucus membranes or skin necrosis: Yes Has patient had a PCN reaction that required hospitalization: No Has patient had a PCN reaction occurring within the last 10 years: No If all of the above answers are "NO", then may proceed with Cephalosporin use.    Wellbutrin  [Bupropion  Hcl] Hives   Amoxicillin Hives    Medication list has been reviewed and updated.  Current Outpatient Medications on File Prior to Visit  Medication Sig Dispense Refill   linaclotide  (LINZESS ) 145 MCG CAPS capsule Take 1 capsule (145 mcg total) by mouth daily before breakfast. 90 capsule 3   tirzepatide  (ZEPBOUND ) 10 MG/0.5ML Pen INJECT 10 MG UNDER THE SKIN ONCE WEEKLY (Patient taking differently: Inject 2.5 mg into the skin once a week.) 2 mL 1   No current facility-administered medications on file prior to visit.    Review of Systems:  As per HPI- otherwise negative.   Physical Examination: Vitals:   10/28/23 1339  BP: 135/76  Pulse: 73   Vitals:   10/28/23 1339  Weight: 200 lb 9.6 oz (91 kg)  Height: 5\' 4"  (1.626 m)   Body mass index is 34.43 kg/m. Ideal Body Weight: Weight in (lb) to have BMI = 25: 145.3  GEN: no acute distress. Overweight, looks well   HEENT: Atraumatic, Normocephalic.  Bilateral TM wnl, oropharynx normal.  PEERL,EOMI.   Ears and Nose: No external deformity. CV: RRR, No M/G/R. No JVD. No thrill. No extra heart sounds. PULM: CTA B, no wheezes, crackles, rhonchi. No retractions. No resp. distress. No accessory muscle use. ABD: S, NT, ND, +BS. No rebound. No HSM. EXTR: No c/c/e PSYCH: Normally  interactive. Conversant.  Results for orders placed or performed in visit on 10/28/23  POCT urine pregnancy   Collection Time: 10/28/23  2:21 PM  Result Value Ref Range   Preg Test, Ur Negative Negative     Assessment and Plan: Fatigue, unspecified type - Plan: B12, Ferritin, VITAMIN D  25 Hydroxy (Vit-D Deficiency, Fractures)  Hot flashes - Plan: POCT urine pregnancy  Screening, lipid - Plan: Lipid panel  Screening for diabetes mellitus - Plan: Comprehensive metabolic panel with GFR, Hemoglobin A1c  Screening for deficiency anemia - Plan: CBC  Thyroid  disorder screening - Plan: TSH  Class 2 obesity due to excess calories with body mass index (BMI) of 37.0 to 37.9 in adult, unspecified whether serious comorbidity present - Plan: tirzepatide  5 MG/0.5ML injection vial  Patient seen today with concern of fatigue.  Blood work is pending as above.  She wonders about menopause-however, because she is still menstruating regularly I think this is less likely.  hCG is negative today I will be in touch with her pending her labs and we can then plan the next step Increased tirzepetide 5 mg to further assist with weight loss Signed Gates Kasal, MD  Received labs as below, message t pt 6/10  Results for orders placed or performed in visit on 10/28/23  CBC   Collection Time: 10/28/23  2:19 PM  Result Value Ref Range   WBC 4.4 3.4 - 10.8 x10E3/uL   RBC 4.77 3.77 - 5.28 x10E6/uL   Hemoglobin 14.9 11.1 - 15.9 g/dL   Hematocrit 46.9 62.9 - 46.6 %   MCV 96 79 - 97 fL   MCH 31.2 26.6 - 33.0 pg   MCHC 32.4 31.5 - 35.7 g/dL    RDW 52.8 41.3 - 24.4 %   Platelets 313 150 - 450 x10E3/uL  Comprehensive metabolic panel with GFR   Collection Time: 10/28/23  2:19 PM  Result Value Ref Range   Glucose 82 70 - 99 mg/dL   BUN 13 6 - 24 mg/dL   Creatinine, Ser 0.10 0.57 - 1.00 mg/dL   eGFR 97 >27 OZ/DGU/4.40   BUN/Creatinine Ratio 17 9 - 23   Sodium 134 134 - 144 mmol/L   Potassium 4.1 3.5 - 5.2 mmol/L   Chloride 99 96 - 106 mmol/L   CO2 22 20 - 29 mmol/L   Calcium 9.5 8.7 - 10.2 mg/dL   Total Protein 7.3 6.0 - 8.5 g/dL   Albumin 4.4 3.9 - 4.9 g/dL   Globulin, Total 2.9 1.5 - 4.5 g/dL   Bilirubin Total 0.5 0.0 - 1.2 mg/dL   Alkaline Phosphatase 72 44 - 121 IU/L   AST 15 0 - 40 IU/L   ALT 16 0 - 32 IU/L  Hemoglobin A1c   Collection Time: 10/28/23  2:19 PM  Result Value Ref Range   Hgb A1c MFr Bld 5.0 4.8 - 5.6 %   Est. average glucose Bld gHb Est-mCnc 97 mg/dL  Lipid panel   Collection Time: 10/28/23  2:19 PM  Result Value Ref Range   Cholesterol, Total 223 (H) 100 - 199 mg/dL   Triglycerides 65 0 - 149 mg/dL   HDL 65 >34 mg/dL   VLDL Cholesterol Cal 11 5 - 40 mg/dL   LDL Chol Calc (NIH) 742 (H) 0 - 99 mg/dL   Chol/HDL Ratio 3.4 0.0 - 4.4 ratio  TSH   Collection Time: 10/28/23  2:19 PM  Result Value Ref Range   TSH 1.730 0.450 - 4.500 uIU/mL  B12   Collection Time: 10/28/23  2:19 PM  Result Value Ref Range   Vitamin B-12 376 232 - 1,245 pg/mL  Ferritin   Collection Time: 10/28/23  2:19 PM  Result Value Ref Range   Ferritin 97 15 - 150 ng/mL  VITAMIN D  25 Hydroxy (Vit-D Deficiency, Fractures)   Collection Time: 10/28/23  2:19 PM  Result Value Ref Range   Vit D, 25-Hydroxy 21.7 (L) 30.0 - 100.0 ng/mL  POCT urine pregnancy   Collection Time: 10/28/23  2:21 PM  Result Value Ref Range   Preg Test, Ur Negative Negative

## 2023-10-28 ENCOUNTER — Encounter: Payer: Self-pay | Admitting: Family Medicine

## 2023-10-28 ENCOUNTER — Ambulatory Visit: Admitting: Family Medicine

## 2023-10-28 VITALS — BP 135/76 | HR 73 | Ht 64.0 in | Wt 200.6 lb

## 2023-10-28 DIAGNOSIS — Z130 Encounter for screening for diseases of the blood and blood-forming organs and certain disorders involving the immune mechanism: Secondary | ICD-10-CM

## 2023-10-28 DIAGNOSIS — Z6837 Body mass index (BMI) 37.0-37.9, adult: Secondary | ICD-10-CM

## 2023-10-28 DIAGNOSIS — E6609 Other obesity due to excess calories: Secondary | ICD-10-CM

## 2023-10-28 DIAGNOSIS — Z131 Encounter for screening for diabetes mellitus: Secondary | ICD-10-CM

## 2023-10-28 DIAGNOSIS — R232 Flushing: Secondary | ICD-10-CM

## 2023-10-28 DIAGNOSIS — E66812 Obesity, class 2: Secondary | ICD-10-CM

## 2023-10-28 DIAGNOSIS — Z13220 Encounter for screening for lipoid disorders: Secondary | ICD-10-CM | POA: Diagnosis not present

## 2023-10-28 DIAGNOSIS — R5383 Other fatigue: Secondary | ICD-10-CM

## 2023-10-28 DIAGNOSIS — Z1329 Encounter for screening for other suspected endocrine disorder: Secondary | ICD-10-CM

## 2023-10-28 LAB — POCT URINE PREGNANCY: Preg Test, Ur: NEGATIVE

## 2023-10-28 MED ORDER — TIRZEPATIDE-WEIGHT MANAGEMENT 5 MG/0.5ML ~~LOC~~ SOLN: 5.0000 mg | SUBCUTANEOUS | 0 refills | Status: DC

## 2023-10-28 NOTE — Patient Instructions (Signed)
 I will look for any explanation for your fatigue in your labs today- will be in touch asap  Let me know if anything is changing or getting worse

## 2023-10-29 ENCOUNTER — Encounter: Payer: Self-pay | Admitting: Family Medicine

## 2023-10-29 ENCOUNTER — Other Ambulatory Visit (HOSPITAL_COMMUNITY): Payer: Self-pay

## 2023-10-29 ENCOUNTER — Telehealth: Payer: Self-pay

## 2023-10-29 LAB — COMPREHENSIVE METABOLIC PANEL WITH GFR
ALT: 16 IU/L (ref 0–32)
AST: 15 IU/L (ref 0–40)
Albumin: 4.4 g/dL (ref 3.9–4.9)
Alkaline Phosphatase: 72 IU/L (ref 44–121)
BUN/Creatinine Ratio: 17 (ref 9–23)
BUN: 13 mg/dL (ref 6–24)
Bilirubin Total: 0.5 mg/dL (ref 0.0–1.2)
CO2: 22 mmol/L (ref 20–29)
Calcium: 9.5 mg/dL (ref 8.7–10.2)
Chloride: 99 mmol/L (ref 96–106)
Creatinine, Ser: 0.76 mg/dL (ref 0.57–1.00)
Globulin, Total: 2.9 g/dL (ref 1.5–4.5)
Glucose: 82 mg/dL (ref 70–99)
Potassium: 4.1 mmol/L (ref 3.5–5.2)
Sodium: 134 mmol/L (ref 134–144)
Total Protein: 7.3 g/dL (ref 6.0–8.5)
eGFR: 97 mL/min/{1.73_m2} (ref >59)

## 2023-10-29 LAB — LIPID PANEL
Chol/HDL Ratio: 3.4 ratio (ref 0.0–4.4)
Cholesterol, Total: 223 mg/dL — ABNORMAL HIGH (ref 100–199)
HDL: 65 mg/dL (ref >39)
LDL Chol Calc (NIH): 147 mg/dL — ABNORMAL HIGH (ref 0–99)
Triglycerides: 65 mg/dL (ref 0–149)
VLDL Cholesterol Cal: 11 mg/dL (ref 5–40)

## 2023-10-29 LAB — CBC
Hematocrit: 46 % (ref 34.0–46.6)
Hemoglobin: 14.9 g/dL (ref 11.1–15.9)
MCH: 31.2 pg (ref 26.6–33.0)
MCHC: 32.4 g/dL (ref 31.5–35.7)
MCV: 96 fL (ref 79–97)
Platelets: 313 10*3/uL (ref 150–450)
RBC: 4.77 x10E6/uL (ref 3.77–5.28)
RDW: 12.7 % (ref 11.7–15.4)
WBC: 4.4 10*3/uL (ref 3.4–10.8)

## 2023-10-29 LAB — HEMOGLOBIN A1C
Est. average glucose Bld gHb Est-mCnc: 97 mg/dL
Hgb A1c MFr Bld: 5 % (ref 4.8–5.6)

## 2023-10-29 LAB — FERRITIN: Ferritin: 97 ng/mL (ref 15–150)

## 2023-10-29 LAB — VITAMIN B12: Vitamin B-12: 376 pg/mL (ref 232–1245)

## 2023-10-29 LAB — VITAMIN D 25 HYDROXY (VIT D DEFICIENCY, FRACTURES): Vit D, 25-Hydroxy: 21.7 ng/mL — ABNORMAL LOW (ref 30.0–100.0)

## 2023-10-29 LAB — TSH: TSH: 1.73 u[IU]/mL (ref 0.450–4.500)

## 2023-10-29 NOTE — Telephone Encounter (Signed)
 [name] is approved exclusively as an adjunct to diet and exercise to improve glycemic  control in adults with type 2 diabetes mellitus. A review of patient's medical chart reveals no  documented diagnosis of type 2 diabetes or an A1C indicative of diabetes. Therefore, they do not  currently meet the criteria for prior authorization of this medication. If clinically appropriate, alternative  options such as Saxenda, Zepbound , or Wegovy  may be considered for this patient. A1C from 09/27/23 is 5.0. Patient already has prior authorization for zepbound   See previous encounter

## 2023-12-10 MED ORDER — TIRZEPATIDE-WEIGHT MANAGEMENT 2.5 MG/0.5ML ~~LOC~~ SOLN: 2.5000 mg | SUBCUTANEOUS | 1 refills | Status: DC

## 2023-12-10 NOTE — Addendum Note (Signed)
 Addended by: WATT HARLENE BROCKS on: 12/10/2023 04:33 PM   Modules accepted: Orders

## 2023-12-11 ENCOUNTER — Telehealth: Payer: Self-pay

## 2023-12-11 ENCOUNTER — Other Ambulatory Visit (HOSPITAL_COMMUNITY): Payer: Self-pay

## 2023-12-11 NOTE — Telephone Encounter (Signed)
 Pharmacy Patient Advocate Encounter   Received notification from CoverMyMeds that prior authorization for Zepbound  2.5 is required/requested.   Insurance verification completed.   The patient is insured through [employer] .   Per test claim: Zepbound  no longer covered by patient plan and now prefers Wegovy . Ran test claim for Wegovy  0.25 and it seems approved PA transferred to Wegovy . PA expires 04/30/24. Co-pay is $0.00.

## 2024-02-18 ENCOUNTER — Encounter: Payer: Self-pay | Admitting: Family Medicine

## 2024-02-18 DIAGNOSIS — N926 Irregular menstruation, unspecified: Secondary | ICD-10-CM

## 2024-02-18 DIAGNOSIS — Z15068 Genetic susceptibility to other malignant neoplasm of digestive system: Secondary | ICD-10-CM

## 2024-02-19 NOTE — Addendum Note (Signed)
 Addended by: WATT RAISIN C on: 02/19/2024 12:30 PM   Modules accepted: Orders

## 2024-02-21 NOTE — Progress Notes (Signed)
 [facility] [address], [address] [place], [place] [address] [phone]  Date:  02/24/2024   Name:  Susan Roth   DOB:  10/01/1975   MRN:  [id number]  PCP:  Watt Harlene BROCKS, MD    Chief Complaint: Anxiety   History of Present Illness:  Susan Roth is a 48 y.o. very pleasant female patient who presents with the following:  Virtual visit today to discuss anxiety.  I saw her most recently in June Pt is at home, I am at my office Pt ID confirmed with 2 factors, she gives consent for a virtual visit today- pt and myself are on the call today   She has family history of Lynch syndrome and some genetic mutations herself, also history of IBS   Discussed the use of AI scribe software for clinical note transcription with the patient, who gave verbal consent to proceed.  History of Present Illness Susan Roth is a 48 year old female who presents with anxiety and gambling addiction.  She has been experiencing anxiety and has been attending counseling for the past three weeks. Her counselor suggested she might benefit from medication to address her anxiety and gambling addiction.  She has a history of gambling addiction, which she believes may be linked to anxiety or depression. She previously tried Wellbutrin , which seemed to help, but is unsure about its current suitability due to her anxiety. She also used Pristiq  about thirteen to fourteen years ago. Does not really remember taking this   She describes feeling like 'everybody is against me,' a sensation she acknowledges is not true but has been a recurring theme throughout her life. She tends to be withdrawn, preferring to stay to herself despite having friends and a boyfriend. She works on weekends and prefers to keep busy with work.  Her sleep is generally normal except for disturbances around her menstrual cycle, which she attributes to aging. Her appetite remains  unchanged, and she maintains a regular exercise routine, going to the gym five days a week.  No current thoughts of self-harm, but she recalls feeling overwhelmed and unable to recover financially when her gambling was at its worst. She has since gained better control over her gambling habits, no longer spending her entire paycheck immediately.    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   Low back pain 12/06/2014   Paresthesia 02/09/2013   IBS (irritable bowel syndrome) 11/13/2012   Admission for sterilization 08/04/2012   Dysmenorrhea 08/04/2012   Obesity 06/25/2012   Urticaria, 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   Anxiety and depression 09/26/2010   ACNE, MILD 09/27/2008   Herpes zoster 09/17/2008    Past Medical History:  Diagnosis Date   Abnormal Pap smear of cervix    cryo   Allergic rhinitis    Allergy     Constipation    is every day- pt off Linzess     Dysfunction of eustachian tube    Endometriosis    GERD (gastroesophageal reflux disease)    occasional   Herpes zoster without mention of complication    History of kidney stones    Hx of adenomatous polyp  of colon 08/27/2023   IBS (irritable bowel syndrome)    Neuropathy    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    Obesity     Past Surgical History:  Procedure Laterality Date   BILATERAL SALPINGECTOMY Right 08/04/2012   Procedure: BILATERAL SALPINGECTOMY;  Surgeon: Harland JAYSON Birkenhead, MD;  Location: [facility];  Service: Gynecology;  Laterality: Right;   CESAREAN SECTION  1998   x1   CHOLECYSTECTOMY N/A 01/30/2018   Procedure: LAPAROSCOPIC CHOLECYSTECTOMY;  Surgeon: Tanda Locus, MD;  Location: [facility];  Service: General;   Laterality: N/A;   COLONOSCOPY     LAPAROSCOPIC TUBAL LIGATION N/A 08/04/2012   Procedure: LAPAROSCOPIC TUBAL LIGATION;  Surgeon: Harland JAYSON Birkenhead, MD;  Location: [facility];  Service: Gynecology;  Laterality: N/A;  Operative lsc for right salpingectomy & Mirena insertion- pt had a  bilat salphingectomy NOT tubal    TONSILLECTOMY     as an adult - 2011   WISDOM TOOTH EXTRACTION      Social History   Tobacco Use   Smoking status: Never   Smokeless tobacco: Never   Tobacco comments:    positive hx of passive tobacco smoke exposure  Vaping Use   Vaping status: Never Used  Substance Use Topics   Alcohol use: Yes    Alcohol/week: 1.0 standard drink of alcohol    Types: 1 Glasses of wine per week    Comment: occasionally   Drug use: No    Family History  Problem Relation Age of Onset   Hyperlipidemia Mother    Colon polyps Mother    Nephrolithiasis Mother    Diverticulosis Mother    Nephrolithiasis Father    Depression Father        bipolar   Colon cancer Father 55       Lynch   Bladder Cancer Father 24   Asthma Sister    Colon polyps Sister        paternal half sister; Lynch syndrome   Asthma Sister    Allergies Sister    Allergies Brother    Clotting disorder Maternal Uncle    Colon cancer Paternal Aunt 30   Diabetes Paternal Aunt    Colon cancer Paternal Uncle 50   Colon cancer Paternal Uncle 54   Ovarian cancer Maternal Grandmother    Colon cancer Other        double great uncle from both mom and dads side   Esophageal cancer Neg Hx    Rectal cancer Neg Hx    Stomach cancer Neg Hx     Allergies  Allergen Reactions   Penicillins Hives    Has patient had a PCN reaction causing immediate rash, facial/tongue/throat swelling, SOB or lightheadedness with hypotension: No Has patient had a PCN reaction causing severe rash involving mucus membranes or skin necrosis: Yes Has patient had a PCN reaction that required hospitalization: No Has patient had a PCN reaction occurring  within the last 10 years: No If all of the above answers are NO, then may proceed with Cephalosporin use.    Wellbutrin  [Bupropion  Hcl] Hives   Amoxicillin Hives    Medication list has been reviewed and updated.  Current Outpatient Medications on File Prior to Visit  Medication Sig Dispense Refill   linaclotide  (LINZESS ) 145 MCG CAPS capsule Take 1 capsule (145 mcg total) by mouth daily before breakfast. 90 capsule 3   tirzepatide  (ZEPBOUND ) 2.5 MG/0.5ML injection vial Inject 2.5 mg into the skin once a week. 6 mL  1   No current facility-administered medications on file prior to visit.    Review of Systems:  As per HPI- otherwise negative.   Physical Examination: There were no vitals filed for this visit. Vitals:   02/24/24 1148  Height: 5' 4 (1.626 m)   Body mass index is 34.43 kg/m. 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 Counseling ongoing. Previous Wellbutrin  and Pristiq  use. No suicidal ideation. Escitalopram considered for its efficacy in treating gambling disorder and comorbid conditions. - Prescribe escitalopram 10 mg orally once daily. Consider increasing to 20 mg if needed. - Send prescription to [facility] pharmacy. - Instruct to send a MyChart message in two weeks to report on medication efficacy and side effects.  Spoke with pt for 10 minutes today Signed Harlene Schroeder, MD

## 2024-02-24 ENCOUNTER — Telehealth: Admitting: Family Medicine

## 2024-02-24 ENCOUNTER — Encounter: Payer: Self-pay | Admitting: Family Medicine

## 2024-02-24 VITALS — Ht 64.0 in

## 2024-02-24 DIAGNOSIS — F4323 Adjustment disorder with mixed anxiety and depressed mood: Secondary | ICD-10-CM

## 2024-02-24 MED ORDER — ESCITALOPRAM OXALATE 10 MG PO TABS: 10.0000 mg | ORAL_TABLET | Freq: Every day | ORAL | 1 refills | Status: DC

## 2024-04-14 MED ORDER — ESCITALOPRAM OXALATE 20 MG PO TABS: 20.0000 mg | ORAL_TABLET | Freq: Every day | ORAL | 3 refills | Status: AC

## 2024-04-14 NOTE — Addendum Note (Signed)
 Addended by: WATT RAISIN C on: 04/14/2024 01:00 PM   Modules accepted: Orders

## 2024-04-26 NOTE — Progress Notes (Unsigned)
 [facility] at [facility] [address], [address] [place], [place] [address] [phone]  Date:  04/29/2024   Name:  Susan Roth   DOB:  1976-04-06   MRN:  [id number]  PCP:  Watt Harlene BROCKS, MD    Chief Complaint: No chief complaint on file.   History of Present Illness:  Susan Roth is a 48 y.o. very pleasant female patient who presents with the following:  Patient seen today for physical exam/sick visit I saw her most recently for virtual visit in August She is generally in good health but there is a family history of Lynch syndrome, patient has some genetic mutations herself.  She also has history of IBS She has been using Zepbound  for weight control  She went to urgent care on 12/6 for acute illness  Flu shot Colon cancer screening-colonoscopy last year, repeat 1 year Mammogram done 5/24 Pap 7/23-she does have distant history of abnormal Pap   Discussed the use of AI scribe software for clinical note transcription with the patient, who gave verbal consent to proceed.  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   Low back pain 12/06/2014   Paresthesia 02/09/2013   IBS (irritable bowel syndrome) 11/13/2012   Admission for sterilization 08/04/2012   Dysmenorrhea 08/04/2012   Obesity 06/25/2012   Urticaria, 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   Anxiety and depression 09/26/2010   ACNE, MILD 09/27/2008   Herpes zoster 09/17/2008    Past Medical History:  Diagnosis Date   Abnormal Pap smear of cervix    cryo   Allergic rhinitis    Allergy     Constipation    is every day- pt off Linzess      Dysfunction of eustachian tube    Endometriosis    GERD (gastroesophageal reflux disease)    occasional   Herpes zoster without mention of complication    History of kidney stones    Hx of adenomatous polyp of colon 08/27/2023   IBS (irritable bowel syndrome)    Neuropathy    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    Obesity     Past Surgical History:  Procedure Laterality Date   BILATERAL SALPINGECTOMY Right 08/04/2012   Procedure: BILATERAL SALPINGECTOMY;  Surgeon: Harland BROCKS Birkenhead, MD;  Location: [facility];  Service: Gynecology;  Laterality: Right;   CESAREAN SECTION  1998   x1   CHOLECYSTECTOMY N/A 01/30/2018   Procedure: LAPAROSCOPIC CHOLECYSTECTOMY;  Surgeon: Tanda Locus, MD;  Location: [facility];  Service: General;  Laterality: N/A;   COLONOSCOPY     LAPAROSCOPIC TUBAL LIGATION N/A 08/04/2012   Procedure: LAPAROSCOPIC TUBAL LIGATION;  Surgeon: Harland BROCKS Birkenhead, MD;  Location: [facility];  Service: Gynecology;  Laterality: N/A;  Operative lsc for right salpingectomy & Mirena insertion- pt had a  bilat salphingectomy NOT tubal    TONSILLECTOMY     as an adult - 2011   WISDOM TOOTH EXTRACTION      Social History   Tobacco Use   Smoking status: Never   Smokeless tobacco: Never   Tobacco comments:    positive hx of passive tobacco  smoke exposure  Vaping Use   Vaping status: Never Used  Substance Use Topics   Alcohol use: Yes    Alcohol/week: 1.0 standard drink of alcohol    Types: 1 Glasses of wine per week    Comment: occasionally   Drug use: No    Family History  Problem Relation Age of Onset   Hyperlipidemia Mother    Colon polyps Mother    Nephrolithiasis Mother    Diverticulosis Mother    Nephrolithiasis Father    Depression Father        bipolar   Colon cancer Father 24       Lynch   Bladder Cancer Father 41   Asthma Sister    Colon polyps Sister        paternal half sister; Lynch syndrome   Asthma Sister    Allergies Sister    Allergies Brother    Clotting  disorder Maternal Uncle    Colon cancer Paternal Aunt 30   Diabetes Paternal Aunt    Colon cancer Paternal Uncle 75   Colon cancer Paternal Uncle 70   Ovarian cancer Maternal Grandmother    Colon cancer Other        double great uncle from both mom and dads side   Esophageal cancer Neg Hx    Rectal cancer Neg Hx    Stomach cancer Neg Hx     Allergies  Allergen Reactions   Penicillins Hives    Has patient had a PCN reaction causing immediate rash, facial/tongue/throat swelling, SOB or lightheadedness with hypotension: No Has patient had a PCN reaction causing severe rash involving mucus membranes or skin necrosis: Yes Has patient had a PCN reaction that required hospitalization: No Has patient had a PCN reaction occurring within the last 10 years: No If all of the above answers are NO, then may proceed with Cephalosporin use.    Wellbutrin  [Bupropion  Hcl] Hives   Amoxicillin Hives    Medication list has been reviewed and updated.  Current Outpatient Medications on File Prior to Visit  Medication Sig Dispense Refill   escitalopram  (LEXAPRO ) 20 MG tablet Take 1 tablet (20 mg total) by mouth daily. 90 tablet 3   linaclotide  (LINZESS ) 145 MCG CAPS capsule Take 1 capsule (145 mcg total) by mouth daily before breakfast. 90 capsule 3   tirzepatide  (ZEPBOUND ) 2.5 MG/0.5ML injection vial Inject 2.5 mg into the skin once a week. 6 mL 1   No current facility-administered medications on file prior to visit.    Review of Systems:  ***  Physical Examination: There were no vitals filed for this visit. There were no vitals filed for this visit. There is no height or weight on file to calculate BMI. Ideal Body Weight:    ***  Assessment and Plan: No diagnosis found.  Assessment & Plan   Signed Harlene Schroeder, MD

## 2024-04-29 ENCOUNTER — Ambulatory Visit: Admitting: Family Medicine

## 2024-04-29 VITALS — BP 122/80 | HR 77 | Temp 98.0°F | Resp 16 | Ht 64.0 in | Wt 211.0 lb

## 2024-04-29 DIAGNOSIS — K589 Irritable bowel syndrome without diarrhea: Secondary | ICD-10-CM

## 2024-04-29 DIAGNOSIS — Z800 Family history of malignant neoplasm of digestive organs: Secondary | ICD-10-CM

## 2024-04-29 DIAGNOSIS — R051 Acute cough: Secondary | ICD-10-CM | POA: Diagnosis not present

## 2024-04-29 DIAGNOSIS — J0110 Acute frontal sinusitis, unspecified: Secondary | ICD-10-CM | POA: Diagnosis not present

## 2024-04-29 MED ORDER — HYDROCODONE BIT-HOMATROP MBR 5-1.5 MG/5ML PO SOLN: 5.0000 mL | Freq: Three times a day (TID) | ORAL | 0 refills | Status: DC | PRN

## 2024-04-29 MED ORDER — DOXYCYCLINE HYCLATE 100 MG PO CAPS: 100.0000 mg | ORAL_CAPSULE | Freq: Two times a day (BID) | ORAL | 0 refills | Status: DC

## 2024-04-29 NOTE — Patient Instructions (Signed)
 Good to see you- please let me know if you are not feeling better in the next few days

## 2024-04-30 ENCOUNTER — Other Ambulatory Visit (HOSPITAL_COMMUNITY): Payer: Self-pay

## 2024-05-20 ENCOUNTER — Ambulatory Visit: Payer: Self-pay

## 2024-05-20 NOTE — Telephone Encounter (Signed)
 FYI only for provider: appointment scheduled on 05/22/2024 at 9:15am at patient's PCP office with Dr Mabel Pry.

## 2024-05-20 NOTE — Telephone Encounter (Signed)
 FYI Only or Action Required?: FYI only for provider: appointment scheduled on 05/22/2024 at 9:15am at patient's PCP office with Dr Mabel Pry.  Patient was last seen in primary care on 04/29/2024 by Copland, Harlene BROCKS, MD.  Called Nurse Triage reporting Cough.  Symptoms began about a month ago.  Interventions attempted: OTC medications: Benadryl, Prescription medications: doxycycline  100mg  capsule (10 day supply prescribed on 04/29/2024), and Rest, hydration, or home remedies.  Symptoms are: gradually worsening.  Triage Disposition: See PCP When Office is Open (Within 3 Days)  Patient/caregiver understands and will follow disposition?: Yes       Message from [place] J sent at 05/20/2024  8:24 AM EST  Reason for Triage: pt been sick since day after thanksgiving, seen dr, prescribed medicine but now she is weezing, whisteling sound, couping, sweating   Reason for Disposition  Cough has been present for > 3 weeks  Answer Assessment - Initial Assessment Questions Patient started feeling bad the day after Thanksgiving. Patient was given an Albuterol inhaler at Urgent Care 04/25/2024 along with a nasal spray. Patient was seen in the office 04/29/2024 for acute non-recurrent frontal sinusitis and cough Patient states the pharmacy told her the prescription cough medication can interact with her Lexapro  so patient hasn't taken it but once or twice. Patient states she sweats at night but she doesn't have a known fever. Patient starting to have nasal congestion. Patient also states she has been having a vibration-feeling in her left ear that has been going on for months she states--especially laying down at night. Patient has not continued using the nasal spray--she wanted to know if this is a nasal spray that can be used long term. Patient denies chest pain, difficulty breathing, and  Patient has taken Benadryl, drank lemon tea with honey. No chance of pregnancy  Patient is advised to  call us  back if anything changes or with any further questions/concerns. Patient is advised that if anything worsens to go to the Emergency Room. Patient verbalized understanding.  Protocols used: Cough - Acute Non-Productive-A-AH

## 2024-05-22 ENCOUNTER — Ambulatory Visit (INDEPENDENT_AMBULATORY_CARE_PROVIDER_SITE_OTHER): Admitting: Family Medicine

## 2024-05-22 ENCOUNTER — Ambulatory Visit: Payer: Self-pay | Admitting: Family Medicine

## 2024-05-22 ENCOUNTER — Encounter: Payer: Self-pay | Admitting: Family Medicine

## 2024-05-22 VITALS — BP 128/82 | HR 100 | Temp 98.0°F | Resp 16 | Ht 64.0 in | Wt 210.0 lb

## 2024-05-22 DIAGNOSIS — N3090 Cystitis, unspecified without hematuria: Secondary | ICD-10-CM

## 2024-05-22 DIAGNOSIS — J0100 Acute maxillary sinusitis, unspecified: Secondary | ICD-10-CM | POA: Diagnosis not present

## 2024-05-22 LAB — URINALYSIS
Bilirubin Urine: NEGATIVE
Ketones, ur: NEGATIVE
Leukocytes,Ua: NEGATIVE
Nitrite: NEGATIVE
Specific Gravity, Urine: 1.015 (ref 1.000–1.030)
Total Protein, Urine: NEGATIVE
Urine Glucose: NEGATIVE
Urobilinogen, UA: 0.2 (ref 0.0–1.0)
pH: 7 (ref 5.0–8.0)

## 2024-05-22 MED ORDER — FLUTICASONE PROPIONATE 50 MCG/ACT NA SUSP: 2.0000 | Freq: Every day | NASAL | 6 refills | Status: AC

## 2024-05-22 MED ORDER — CEFDINIR 300 MG PO CAPS: 300.0000 mg | ORAL_CAPSULE | Freq: Two times a day (BID) | ORAL | 0 refills | Status: AC

## 2024-05-22 MED ORDER — FLUCONAZOLE 150 MG PO TABS: ORAL_TABLET | ORAL | 0 refills | Status: AC

## 2024-05-22 NOTE — Patient Instructions (Signed)
 Continue to push fluids, practice good hand hygiene, and cover your mouth if you cough.  If you start having fevers, shaking or shortness of breath, seek immediate care.  Start the Flonase .  Let us  know if you need anything.

## 2024-05-22 NOTE — Addendum Note (Signed)
 Addended by: FRANN MABEL SQUIBB on: 05/22/2024 10:18 AM   Modules accepted: Level of Service

## 2024-05-22 NOTE — Progress Notes (Signed)
 Chief Complaint  Patient presents with   Cough    Cough and congestion    Sherrilyn GORMAN Molt here for URI complaints.  Duration: 5 weeks  Associated symptoms: sinus headache, sinus pain, rhinorrhea, ear fluttering, sore throat, wheezing, and productive cough Denies: sinus congestion, ear pain, ear drainage, shortness of breath, myalgia, and fevers Treatment to date: prednisone , honey, tea, cough drops, Tylenol , Benadryl, ibuprofen , doxy, cough syrup Sick contacts: No  This morning patient woke up and urinated blood.  She denies any pain, frequency, urgency, hesitancy, retention, or discharge.  No new sexual partners.  She has a history of UTIs and this is similar.  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    Obesity     Objective BP 128/82 (BP Location: Left Arm, Patient Position: Sitting)   Pulse 100   Temp 98 F (36.7 C) (Oral)   Resp 16   Ht 5' 4 (1.626 m)   Wt 210 lb (95.3 kg)   LMP 04/14/2024 (Exact Date)   SpO2 98%   BMI 36.05 kg/m  General: Awake, alert, appears stated age HEENT: AT, [place], ears patent b/l and TM's neg, nares patent w/o discharge, pharynx pink and without exudates, MMM, TTP over the left maxillary sinus with overlying edema compared to the right Neck: No masses or asymmetry Heart: RRR Abdomen: Bowel sounds present, soft, nontender, nondistended MSK: No CVA TTP Lungs: CTAB, no accessory muscle use Psych: Age appropriate judgment and insight, normal mood and affect  Acute maxillary sinusitis, recurrence not  specified - Plan: cefdinir (OMNICEF) 300 MG capsule, fluconazole  (DIFLUCAN ) 150 MG tablet  Cystitis - Plan: cefdinir (OMNICEF) 300 MG capsule, fluconazole  (DIFLUCAN ) 150 MG tablet, Urine Culture, Urinalysis  Omnicef 300 mg twice daily for 7 days, Diflucan  if she needs it.  This should cover for both of the above.  Check urine as above.  If she does not fully improve, would consider CT of her sinuses and possible ENT referral.  I do want her to start an intranasal corticosteroid.  Flonase  sent.  Continue to push fluids, practice good hand hygiene, cover mouth when coughing. F/u prn. If starting to experience fevers, shaking, or shortness of breath, seek immediate care. Pt voiced understanding and agreement to the plan.  Mabel Mt [place], DO 05/22/2024 10:17 AM

## 2024-05-23 LAB — URINE CULTURE
MICRO NUMBER:: 17419441
SPECIMEN QUALITY:: ADEQUATE

## 2024-05-24 ENCOUNTER — Emergency Department (HOSPITAL_BASED_OUTPATIENT_CLINIC_OR_DEPARTMENT_OTHER): Admitting: Radiology

## 2024-05-24 ENCOUNTER — Other Ambulatory Visit: Payer: Self-pay

## 2024-05-24 ENCOUNTER — Emergency Department (HOSPITAL_BASED_OUTPATIENT_CLINIC_OR_DEPARTMENT_OTHER)
Admission: EM | Admit: 2024-05-24 | Discharge: 2024-05-24 | Disposition: A | Discharge status: Home / Self Care | Attending: Emergency Medicine | Admitting: Emergency Medicine

## 2024-05-24 DIAGNOSIS — H6122 Impacted cerumen, left ear: Secondary | ICD-10-CM | POA: Diagnosis not present

## 2024-05-24 DIAGNOSIS — J018 Other acute sinusitis: Secondary | ICD-10-CM | POA: Diagnosis not present

## 2024-05-24 DIAGNOSIS — R059 Cough, unspecified: Secondary | ICD-10-CM | POA: Diagnosis present

## 2024-05-24 MED ORDER — BENZONATATE 100 MG PO CAPS: 100.0000 mg | ORAL_CAPSULE | Freq: Three times a day (TID) | ORAL | 0 refills | Status: AC

## 2024-05-24 MED ORDER — PROMETHAZINE-DM 6.25-15 MG/5ML PO SYRP: 5.0000 mL | ORAL_SOLUTION | Freq: Three times a day (TID) | ORAL | 0 refills | Status: AC | PRN

## 2024-05-24 NOTE — ED Provider Notes (Addendum)
 " [facility] AT [facility] Provider Note   CSN: [id number] Arrival date & time: 05/24/24  9148     Patient presents with: Sinus Problem   Susan Roth is a 49 y.o. female.  presents to the ED with headache and cough that started a month ago.  She reports that the cough is productive and worse at night.  Patient also notes sinus tenderness and shortness of breath while coughing.  She reports the headache is localized to left temple.  Patient also notes left ear fullness. She states that she has been prescribed 2 different antibiotics and 1 steroid for her symptoms which has not been helping.  Not using any sinus rinses.  Did use albuterol with relief. Patient has also been using Flonase  without relief.  She denies sick contacts.  Patient was tested for flu, COVID initially which was negative.  Denies history of allergies.  Denies any lung or heart problems.  Denies fevers, chills, nausea, vomiting, diarrhea, abdominal pain, or any other symptoms at this time.      Sinus Problem Associated symptoms include headaches and shortness of breath.       Prior to Admission medications  Medication Sig Start Date End Date Taking? Authorizing Provider  benzonatate  (TESSALON ) 100 MG capsule Take 1 capsule (100 mg total) by mouth every 8 (eight) hours. 05/24/24  Yes Ambers Iyengar, PA-C  promethazine -dextromethorphan (PROMETHAZINE -DM) 6.25-15 MG/5ML syrup Take 5 mLs by mouth 3 (three) times daily as needed for up to 5 days for cough. No more than 30 mL in 24 hours. 05/24/24 05/29/24 Yes Raiden Yearwood, PA-C  albuterol (VENTOLIN HFA) 108 (90 Base) MCG/ACT inhaler Inhale 2 puffs into the lungs every 4 (four) hours as needed for shortness of breath or wheezing. 04/25/24 05/25/24  [provider]  Azelastine HCl 137 MCG/SPRAY SOLN Place into both nostrils. 04/21/24   [provider]  cefdinir  (OMNICEF ) 300 MG capsule Take 1 capsule (300 mg total) by mouth 2 (two) times  daily for 7 days. 05/22/24 05/29/24  Frann Mabel Mt, DO  escitalopram  (LEXAPRO ) 20 MG tablet Take 1 tablet (20 mg total) by mouth daily. 04/14/24   Copland, Harlene BROCKS, MD  fluconazole  (DIFLUCAN ) 150 MG tablet Take 1 tab, repeat in 72 hours if no improvement. 05/22/24   Frann Mabel Mt, DO  fluticasone  (FLONASE ) 50 MCG/ACT nasal spray Place 2 sprays into both nostrils daily. 05/22/24   Frann Mabel Mt, DO  linaclotide  (LINZESS ) 145 MCG CAPS capsule Take 1 capsule (145 mcg total) by mouth daily before breakfast. 08/20/23   Avram Lupita BRAVO, MD    Allergies: Penicillins, Wellbutrin  [bupropion  hcl], and Amoxicillin    Review of Systems  HENT:  Positive for congestion.   Respiratory:  Positive for cough and shortness of breath.   Neurological:  Positive for headaches.    Updated Vital Signs BP 137/74   Pulse 92   Temp 98.7 F (37.1 C) (Oral)   Resp 16   LMP 04/14/2024 (Exact Date)   SpO2 99%   Physical Exam Vitals and nursing note reviewed.  Constitutional:      General: She is not in acute distress.    Appearance: Normal appearance. She is well-developed.  HENT:     Head: Normocephalic and atraumatic.     Comments: Tenderness to maxillary and frontal sinus    Right Ear: Tympanic membrane normal.     Left Ear: Tympanic membrane normal.     Ears:     Comments:  Moderate amount of cerumen in left ear canal, TM not bulging or erythematous.    Nose: Nose normal.     Mouth/Throat:     Mouth: Mucous membranes are moist.     Pharynx: Oropharynx is clear. No pharyngeal swelling, oropharyngeal exudate, posterior oropharyngeal erythema or uvula swelling.  Eyes:     Conjunctiva/sclera: Conjunctivae normal.  Cardiovascular:     Rate and Rhythm: Normal rate and regular rhythm.     Heart sounds: No murmur heard. Pulmonary:     Effort: Pulmonary effort is normal. No respiratory distress.     Breath sounds: Normal breath sounds. No decreased breath sounds, wheezing, rhonchi or  rales.  Abdominal:     Palpations: Abdomen is soft.     Tenderness: There is no abdominal tenderness.  Musculoskeletal:        General: No swelling.     Cervical back: Neck supple.  Skin:    General: Skin is warm and dry.     Capillary Refill: Capillary refill takes less than 2 seconds.  Neurological:     Mental Status: She is alert.  Psychiatric:        Mood and Affect: Mood normal.     (all labs ordered are listed, but only abnormal results are displayed) Labs Reviewed - No data to display  EKG: None  Radiology: No results found.    Procedures   Medications Ordered in the ED - No data to display                                  Medical Decision Making Amount and/or Complexity of Data Reviewed Radiology: ordered.  Risk Prescription drug management.     This patient presents to the ED for concern of sinus headache and cough. Differential diagnosis includes viral illness, COVID, flu, RSV, bronchitis, PNA, sinusitis     Additional history obtained   Additional history obtained from Electronic Medical Record External records from outside source obtained and reviewed including visit notes from 04/29/2024, 102 2026 where she was seen for similar symptoms and prescribed doxycycline  and cefdinir .   Imaging Studies ordered:  I ordered imaging studies including chest x-ray I independently visualized and interpreted imaging which showed no signs of pneumonia or other lung pathology at this time I agree with the radiologist interpretation   Dispostion: Patient presents with sinus headaches and productive cough that started a month ago.  She has been prescribed 2 different antibiotics without relief.  Exam patient is not ill-appearing or in distress.  Lung and heart exam normal.  Vital signs stable.  Chest x-ray did not reveal any signs of pneumonia or other pathology.  Recommended patient to start using sinus rinses, nighttime cough syrup.  Will also prescribe  Tessalon  [place].  Suggested patient to get over-the-counter ear cleaning kit for cerumen in her ear.  Informed patient to complete antibiotic dose and discontinue hycodan.  Continue using albuterol as needed.  Return precautions provided. Recommended patient to follow-up PCP for further management.  Patient is in agreement with plan and is stable for discharge.        Final diagnoses:  Other acute sinusitis, recurrence not specified    ED Discharge Orders          Ordered    benzonatate  (TESSALON ) 100 MG capsule  Every 8 hours        05/24/24 1026    promethazine -dextromethorphan (PROMETHAZINE -DM) 6.25-15 MG/5ML syrup  3 times daily PRN        05/24/24 1026               Braxton Dubois, PA-C 05/24/24 1035    [address], PA-C 05/27/24 [id number]    Dreama Longs, MD 05/28/24 1219  "

## 2024-05-24 NOTE — ED Triage Notes (Signed)
 Patient reports sinus infections since before thanksgiving. States she has been to the doctor 4 times and been on antibiotics twice. Reports left ear pain. States that it will temporarily get better and then she'll get worse again.

## 2024-05-24 NOTE — ED Provider Notes (Signed)
 Patient seen in conjunction with Aanchal PA.  See their note for full details.  In short patient presents with sinus congestion that is persistent for about a month.  Has tried multiple rounds of antibiotics.  Chest x-ray is clear today.  Plan will be for sinus rinse and continuing the fluticasone  that she has already started and combining this with an antihistamine.  Lung sounds are clear.  No acute distress.  Hemodynamically patient is stable.  Patient is agreeable with plan.   Hildegard Loge, PA-C 05/24/24 1045    Dreama Longs, MD 05/28/24 [phone]

## 2024-05-24 NOTE — Discharge Instructions (Addendum)
 Your chest x-ray was negative here today.  Grab a sinus rinse kit over-the-counter for your symptoms. Take Tessalon  pearls as prescribed for cough.  Take Promethazine  DM syrup at night for cough.  Continue using albuterol as needed.  Complete antibiotic dose. Grab Debrox ear wax removal kit for wax removal. Continue using Flonase . Discontinue hycodan syrup for cough.  Rest, fluids to stay hydrated. Return for worsening shortness of breath, fevers, chest pain.  Follow-up with primary care doctor for further evaluation.

## 2024-05-25 DIAGNOSIS — J0100 Acute maxillary sinusitis, unspecified: Secondary | ICD-10-CM

## 2024-05-27 ENCOUNTER — Other Ambulatory Visit: Payer: Self-pay | Admitting: Family Medicine

## 2024-05-27 DIAGNOSIS — Z1231 Encounter for screening mammogram for malignant neoplasm of breast: Secondary | ICD-10-CM

## 2024-05-29 ENCOUNTER — Encounter (INDEPENDENT_AMBULATORY_CARE_PROVIDER_SITE_OTHER): Payer: Self-pay

## 2024-06-10 ENCOUNTER — Ambulatory Visit
Admission: RE | Admit: 2024-06-10 | Discharge: 2024-06-10 | Disposition: A | Source: Ambulatory Visit | Attending: Family Medicine | Admitting: Family Medicine

## 2024-06-10 DIAGNOSIS — Z1231 Encounter for screening mammogram for malignant neoplasm of breast: Secondary | ICD-10-CM

## 2024-06-16 ENCOUNTER — Encounter: Payer: Self-pay | Admitting: Family Medicine

## 2024-06-16 DIAGNOSIS — Z6836 Body mass index (BMI) 36.0-36.9, adult: Secondary | ICD-10-CM

## 2024-06-17 MED ORDER — WEGOVY 1.5 MG PO TABS: 1.5000 mg | ORAL_TABLET | Freq: Every day | ORAL | 1 refills | Status: AC

## 2024-06-17 NOTE — Addendum Note (Signed)
 Addended by: WATT HARLENE BROCKS on: 06/17/2024 06:32 AM   Modules accepted: Orders

## 2024-06-30 ENCOUNTER — Institutional Professional Consult (permissible substitution) (INDEPENDENT_AMBULATORY_CARE_PROVIDER_SITE_OTHER): Admitting: Otolaryngology

## 2024-07-10 ENCOUNTER — Encounter: Admitting: Obstetrics and Gynecology
